# Patient Record
Sex: Female | Born: 1955 | Race: White | Hispanic: No | Marital: Single | Smoking: Never smoker
Health system: Southern US, Community
[De-identification: ages and names within clinical notes are randomized; demographics above are authoritative.]

## PROBLEM LIST (undated history)

## (undated) DIAGNOSIS — F32A Depression, unspecified: Secondary | ICD-10-CM

## (undated) DIAGNOSIS — T7840XA Allergy, unspecified, initial encounter: Secondary | ICD-10-CM

## (undated) DIAGNOSIS — F988 Other specified behavioral and emotional disorders with onset usually occurring in childhood and adolescence: Secondary | ICD-10-CM

## (undated) DIAGNOSIS — S065X9A Traumatic subdural hemorrhage with loss of consciousness of unspecified duration, initial encounter: Secondary | ICD-10-CM

## (undated) DIAGNOSIS — G4730 Sleep apnea, unspecified: Secondary | ICD-10-CM

## (undated) DIAGNOSIS — E039 Hypothyroidism, unspecified: Secondary | ICD-10-CM

## (undated) DIAGNOSIS — M810 Age-related osteoporosis without current pathological fracture: Secondary | ICD-10-CM

## (undated) DIAGNOSIS — E119 Type 2 diabetes mellitus without complications: Secondary | ICD-10-CM

## (undated) DIAGNOSIS — E785 Hyperlipidemia, unspecified: Secondary | ICD-10-CM

## (undated) DIAGNOSIS — R079 Chest pain, unspecified: Secondary | ICD-10-CM

## (undated) DIAGNOSIS — S065XAA Traumatic subdural hemorrhage with loss of consciousness status unknown, initial encounter: Secondary | ICD-10-CM

## (undated) DIAGNOSIS — D649 Anemia, unspecified: Secondary | ICD-10-CM

## (undated) DIAGNOSIS — K219 Gastro-esophageal reflux disease without esophagitis: Secondary | ICD-10-CM

## (undated) DIAGNOSIS — I10 Essential (primary) hypertension: Secondary | ICD-10-CM

## (undated) DIAGNOSIS — N183 Chronic kidney disease, stage 3 unspecified: Secondary | ICD-10-CM

## (undated) DIAGNOSIS — F419 Anxiety disorder, unspecified: Secondary | ICD-10-CM

## (undated) HISTORY — DX: Anemia, unspecified: D64.9

## (undated) HISTORY — DX: Traumatic subdural hemorrhage with loss of consciousness of unspecified duration, initial encounter: S06.5X9A

## (undated) HISTORY — DX: Age-related osteoporosis without current pathological fracture: M81.0

## (undated) HISTORY — DX: Chronic kidney disease, stage 3 (moderate): N18.3

## (undated) HISTORY — PX: COLONOSCOPY: SHX174

## (undated) HISTORY — PX: OTHER SURGICAL HISTORY: SHX169

## (undated) HISTORY — DX: Type 2 diabetes mellitus without complications: E11.9

## (undated) HISTORY — DX: Traumatic subdural hemorrhage with loss of consciousness status unknown, initial encounter: S06.5XAA

## (undated) HISTORY — DX: Other specified behavioral and emotional disorders with onset usually occurring in childhood and adolescence: F98.8

## (undated) HISTORY — DX: Chronic kidney disease, stage 3 unspecified: N18.30

## (undated) HISTORY — DX: Allergy, unspecified, initial encounter: T78.40XA

## (undated) HISTORY — DX: Hypothyroidism, unspecified: E03.9

## (undated) HISTORY — PX: BRAIN SURGERY: SHX531

## (undated) HISTORY — DX: Gastro-esophageal reflux disease without esophagitis: K21.9

## (undated) HISTORY — DX: Hyperlipidemia, unspecified: E78.5

## (undated) HISTORY — DX: Essential (primary) hypertension: I10

## (undated) HISTORY — PX: FRACTURE SURGERY: SHX138

## (undated) HISTORY — DX: Chest pain, unspecified: R07.9

## (undated) HISTORY — PX: SMALL INTESTINE SURGERY: SHX150

## (undated) HISTORY — DX: Sleep apnea, unspecified: G47.30

## (undated) HISTORY — DX: Anxiety disorder, unspecified: F41.9

## (undated) HISTORY — DX: Depression, unspecified: F32.A

## (undated) HISTORY — PX: TONSILLECTOMY: SUR1361

---

## 1993-10-07 DIAGNOSIS — G4730 Sleep apnea, unspecified: Secondary | ICD-10-CM

## 1993-10-07 HISTORY — DX: Sleep apnea, unspecified: G47.30

## 2003-10-08 HISTORY — PX: GASTRIC BYPASS: SHX52

## 2006-01-02 ENCOUNTER — Ambulatory Visit (HOSPITAL_BASED_OUTPATIENT_CLINIC_OR_DEPARTMENT_OTHER): Admission: RE | Admit: 2006-01-02 | Discharge: 2006-01-02 | Payer: Self-pay | Admitting: Orthopedic Surgery

## 2010-06-02 ENCOUNTER — Emergency Department (HOSPITAL_COMMUNITY): Admission: EM | Admit: 2010-06-02 | Discharge: 2010-06-02 | Payer: Self-pay | Admitting: Family Medicine

## 2010-12-21 LAB — GIARDIA/CRYPTOSPORIDIUM SCREEN(EIA)
Cryptosporidium Screen (EIA): NEGATIVE
Giardia Screen - EIA: NEGATIVE

## 2010-12-21 LAB — STOOL CULTURE

## 2011-02-22 NOTE — Op Note (Signed)
NAMESHACONDA, HAJDUK               ACCOUNT NO.:  [id number]   MEDICAL RECORD NO.:  [id number]          PATIENT TYPE:  AMB   LOCATION:  DSC                          FACILITY:  [facility]   PHYSICIAN:  Katy Fitch. Sypher, M.D. 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   OPERATION:  1.  Release of right transverse carpal ligament.  2.  Injection of left ulnar bursa.   OPERATING SURGEON:  Josephine Igo, MD   ASSISTANT:  Annye Rusk, PA-C.   ANESTHESIA:  General by LMA.   SUPERVISING ANESTHESIOLOGIST:  Janetta Hora. Gelene Mink, MD   INDICATIONS:  Tracy Price is a 55 year old right-hand-dominant woman with  a history of bilateral hand numbness and pain.  Clinical examination in our  office revealed evidence of bilateral carpal tunnel syndrome.  Electrodiagnostic studies completed by Dr. Johna Roles revealed moderately  severe right carpal tunnel syndrome and moderate left carpal tunnel  syndrome.   She has been night-splinting without relief.   She is brought to the operating room at this time, anticipating release of  her right transverse carpal ligament and injection of her left ulnar bursa.   Preoperatively, questions were invited and answered.   Due to a history of a recent blister on her right hand, she was provided  perioperative antibiotics in the form of Ancef 1 g IV 1 hour prior to  surgery.   PROCEDURE:  Jarissa Sheriff was brought to the operating room and placed in  supine position upon the operating table.   Following an Anesthesia consult by Dr. Gelene Mink, general anesthesia by LMA  was selected.   The right arm was prepped with Betadine soap and solution and sterilely  draped.  A pneumatic tourniquet  was applied to the proximal brachium.   Following exsanguination of the right arm with an Esmarch bandage, the  arterial tourniquet was inflated to 220 mmHg.  The procedure commenced with  a short incision in the line of the ring finger and the palm.  Subcutaneous  tissues were carefully divided, revealing the palmar fascia.  This was split  longitudinally to reveal the common extensor branch of the median nerve.   These were followed back to transverse carpal ligament, which was carefully  isolated from the median nerve.  The ligament was then released along its  ulnar border, extending into the distal forearm.   This widely opened the carpal canal.   No masses or other predicaments were noted.   Bleeding points along the margin of the released ligament were  electrocauterized with bipolar current followed by repair of the skin with  intradermal 3-0 Prolene suture.   A compressive dressing was applied with a volar plaster splint, maintaining  the wrist in 5 degrees of dorsiflexion.      Katy Fitch Sypher, M.D.  Electronically Signed     RVS/MEDQ  D:  01/02/2006  T:  01/04/2006  Job:  [id number]

## 2013-01-04 ENCOUNTER — Telehealth: Payer: Self-pay | Admitting: Family Medicine

## 2013-01-04 MED ORDER — LOSARTAN POTASSIUM-HCTZ 50-12.5 MG PO TABS: 1.0000 | ORAL_TABLET | Freq: Every day | ORAL | Status: DC

## 2013-01-04 NOTE — Telephone Encounter (Signed)
rx refilled.

## 2013-02-23 ENCOUNTER — Encounter: Payer: Self-pay | Admitting: Family Medicine

## 2013-02-23 ENCOUNTER — Ambulatory Visit (INDEPENDENT_AMBULATORY_CARE_PROVIDER_SITE_OTHER): Payer: BC Managed Care – PPO | Admitting: Family Medicine

## 2013-02-23 VITALS — BP 158/96 | HR 64 | Temp 97.8°F | Resp 18 | Ht 60.75 in | Wt 166.0 lb

## 2013-02-23 DIAGNOSIS — E785 Hyperlipidemia, unspecified: Secondary | ICD-10-CM | POA: Insufficient documentation

## 2013-02-23 DIAGNOSIS — E119 Type 2 diabetes mellitus without complications: Secondary | ICD-10-CM | POA: Insufficient documentation

## 2013-02-23 DIAGNOSIS — R079 Chest pain, unspecified: Secondary | ICD-10-CM

## 2013-02-23 DIAGNOSIS — I10 Essential (primary) hypertension: Secondary | ICD-10-CM | POA: Insufficient documentation

## 2013-02-23 LAB — CBC WITH DIFFERENTIAL/PLATELET
Basophils Absolute: 0.1 10*3/uL (ref 0.0–0.1)
Basophils Relative: 1 % (ref 0–1)
Eosinophils Absolute: 0.6 10*3/uL (ref 0.0–0.7)
HCT: 42.2 % (ref 36.0–46.0)
Hemoglobin: 14.2 g/dL (ref 12.0–15.0)
MCH: 30 pg (ref 26.0–34.0)
MCHC: 33.6 g/dL (ref 30.0–36.0)
Monocytes Absolute: 0.5 10*3/uL (ref 0.1–1.0)
Monocytes Relative: 8 % (ref 3–12)
RDW: 12.8 % (ref 11.5–15.5)

## 2013-02-23 LAB — HEPATIC FUNCTION PANEL
ALT: 13 U/L (ref 0–35)
AST: 15 U/L (ref 0–37)
Albumin: 4.5 g/dL (ref 3.5–5.2)
Alkaline Phosphatase: 72 U/L (ref 39–117)
Indirect Bilirubin: 0.5 mg/dL (ref 0.0–0.9)
Total Protein: 7.2 g/dL (ref 6.0–8.3)

## 2013-02-23 LAB — BASIC METABOLIC PANEL
CO2: 31 mEq/L (ref 19–32)
Calcium: 9.7 mg/dL (ref 8.4–10.5)
Chloride: 98 mEq/L (ref 96–112)
Creat: 0.79 mg/dL (ref 0.50–1.10)
Glucose, Bld: 100 mg/dL — ABNORMAL HIGH (ref 70–99)
Sodium: 138 mEq/L (ref 135–145)

## 2013-02-23 LAB — LIPID PANEL: LDL Cholesterol: 75 mg/dL (ref 0–99)

## 2013-02-23 MED ORDER — AMLODIPINE BESYLATE 10 MG PO TABS: 10.0000 mg | ORAL_TABLET | Freq: Every day | ORAL | Status: DC

## 2013-02-23 NOTE — Progress Notes (Signed)
Subjective:    Patient ID: Tracy Price, female    DOB: [birth year], 57 y.o.   MRN: [id number]  HPI  Patient is a 57 year old white female who presents with atypical chest pain for 6 weeks. It is substernal in location. It feels like a heaviness in the chest.  It is triggered by exertion.  She denies shortness of breath. However she reports a pain that radiates into her neck when she has the chest pressure. She is very nervous this morning. Previous her blood pressure had been well controlled 130/ 80 on Hyzaar 50/12.5.  However today her blood pressure is high 180-194/100-112.  She has a past medical history of hypertension, hyperlipidemia, and diabetes. Furthermore she has a very significant family history of cardiovascular disease with the mother whose had a bypass and stroke and a brother had a heart attack in his 63s.    She believes this is due to stress triggered by the death of her sister from sepsis. She is pain-free at rest Past Medical History  Diagnosis Date  . Diabetes mellitus without complication   . Hyperlipidemia   . Hypertension    Current outpatient prescriptions:aspirin 325 MG tablet, Take 325 mg by mouth daily., Disp: , Rfl: ;  Biotin 10 MG CAPS, Take by mouth daily., Disp: , Rfl: ;  cetirizine (ZYRTEC) 10 MG tablet, Take 10 mg by mouth daily., Disp: , Rfl: ;  ibuprofen (ADVIL,MOTRIN) 200 MG tablet, Take 400 mg by mouth at bedtime as needed for pain., Disp: , Rfl:  losartan-hydrochlorothiazide (HYZAAR) 50-12.5 MG per tablet, Take 1 tablet by mouth daily., Disp: 90 tablet, Rfl: 1;  magnesium oxide (MAG-OX) 400 MG tablet, Take 400 mg by mouth daily., Disp: , Rfl: ;  Melatonin 1 MG TABS, Take by mouth at bedtime as needed., Disp: , Rfl: ;  metFORMIN (GLUCOPHAGE) 500 MG tablet, Take 500 mg by mouth 2 (two) times daily with a meal., Disp: , Rfl:  pravastatin (PRAVACHOL) 40 MG tablet, Take 40 mg by mouth at bedtime., Disp: , Rfl: ;  vitamin B-12 (CYANOCOBALAMIN) 1000 MCG tablet, Take  1,000 mcg by mouth daily., Disp: , Rfl: ;  amLODipine (NORVASC) 10 MG tablet, Take 1 tablet (10 mg total) by mouth daily., Disp: 30 tablet, Rfl: 5  No Known Allergies History   Social History  . Marital Status: Single    Spouse Name: N/A    Number of Children: N/A  . Years of Education: N/A   Occupational History  . Not on file.   Social History Main Topics  . Smoking status: Never Smoker   . Smokeless tobacco: Never Used  . Alcohol Use: No  . Drug Use: No  . Sexually Active: Not on file   Other Topics Concern  . Not on file   Social History Narrative  . No narrative on file   Family History  Problem Relation Age of Onset  . Heart disease Mother   . Diabetes Sister   . Heart disease Brother       Review of Systems  All other systems reviewed and are negative.       Objective:   Physical Exam  Vitals reviewed. Constitutional: She appears well-developed and well-nourished.  Neck: No JVD present. No thyromegaly present.  Cardiovascular: Normal rate, regular rhythm, normal heart sounds and intact distal pulses.  Exam reveals no gallop.   No murmur heard. Pulmonary/Chest: Effort normal and breath sounds normal. No respiratory distress. She has no wheezes. She has no rales. She  exhibits no tenderness.  Abdominal: Soft. Bowel sounds are normal. She exhibits no distension. There is no tenderness. There is no rebound and no guarding.  Lymphadenopathy:    She has no cervical adenopathy.          Assessment & Plan:  1. Chest pain EKG shows normal sinus rhythm with a heart rate of 69 beats per minute. She has normal intervals but a slight left axis deviation. There is no evidence of ST depression or elevation. There is no evidence of ischemia or infarction.  However I am concerned her symptoms represent angina.  I have asked the patient to take an aspirin empirically 5 mg by mouth daily. I also think we need to get her blood pressure down immediately. Therefore I have  asked her to discontinue Hyzaar. Kindly to start her on Benicar/HCTZ 40/12.5 one by mouth daily she is to take the first dose now. I also have added Norvasc 10 mg by mouth daily. I want to arrange cardiology consultation as soon as possible.  We'll recheck the patient's blood pressure in 48 hours. I think some of this elevation is due to anxiety as it has previously been well controlled. I am also going to risk stratify the patient with a hemoglobin A1c, fasting lipid panel. - EKG 12-Lead - Basic Metabolic Panel - CBC with Differential - Hemoglobin A1c - Hepatic Function Panel - Lipid Panel  2. Type II or unspecified type diabetes mellitus without mention of complication, not stated as uncontrolled - Hemoglobin A1c

## 2013-02-24 ENCOUNTER — Telehealth: Payer: Self-pay | Admitting: Family Medicine

## 2013-02-24 MED ORDER — METFORMIN HCL 1000 MG PO TABS: 1000.0000 mg | ORAL_TABLET | Freq: Two times a day (BID) | ORAL | Status: DC

## 2013-02-24 NOTE — Telephone Encounter (Signed)
No quit losartan, but yes take the other meds together.

## 2013-02-24 NOTE — Telephone Encounter (Signed)
Message copied by Donne Anon on Wed Feb 24, 2013  4:38 PM ------      Message from: Lynnea Ferrier      Created: Wed Feb 24, 2013  7:29 AM       Hga1c is slightly high at 6.8.  Increase metformin to 1000 mg pobid.  Recheck BP Friday. ------

## 2013-02-24 NOTE — Telephone Encounter (Signed)
Left mess to return call.  Rx sent to pharmacy

## 2013-02-25 ENCOUNTER — Encounter: Payer: Self-pay | Admitting: Cardiovascular Disease

## 2013-02-25 ENCOUNTER — Ambulatory Visit (INDEPENDENT_AMBULATORY_CARE_PROVIDER_SITE_OTHER): Payer: BC Managed Care – PPO | Admitting: Cardiovascular Disease

## 2013-02-25 VITALS — BP 128/80 | HR 84 | Ht 60.5 in | Wt 164.0 lb

## 2013-02-25 DIAGNOSIS — E785 Hyperlipidemia, unspecified: Secondary | ICD-10-CM

## 2013-02-25 DIAGNOSIS — R079 Chest pain, unspecified: Secondary | ICD-10-CM

## 2013-02-25 DIAGNOSIS — I10 Essential (primary) hypertension: Secondary | ICD-10-CM

## 2013-02-25 DIAGNOSIS — Z8249 Family history of ischemic heart disease and other diseases of the circulatory system: Secondary | ICD-10-CM

## 2013-02-25 NOTE — Patient Instructions (Signed)
  Your physician wants you to follow-up with him AFTER THE STRESS TEST                                                    Your physician has recommended you make the following change in your medication: STOP THE LOSARTAN HCT, CONTINUE THE BENICAR HCT   Your physician has ordered the following tests: CARDIAC STRESS TEST (EXERCISE MYOVIEW) Your physician has requested that you have en exercise stress myoview. For further information please visit [email or web address] Please follow instruction sheet, as given.

## 2013-02-25 NOTE — Assessment & Plan Note (Signed)
Under good control on oral antihypertensive medications followed by Dr. Tanya Nones

## 2013-02-25 NOTE — Assessment & Plan Note (Signed)
Under good control on statin therapy followed by Dr. Tanya Nones

## 2013-02-25 NOTE — Assessment & Plan Note (Signed)
The patient has multiple cardiac risk factors including a strong family history of heart disease, hypertension, hyperlipidemia and newly diagnosed type 2 diabetes. Based on this I don't perform an exercise Myoview stress test to risk stratify her and rule out an ischemic etiology.

## 2013-02-25 NOTE — Progress Notes (Signed)
02/25/2013 Tracy Price   Feb 15, 1956  [id number]  Primary Physician Leo Grosser, MD Primary Cardiologist: Runell Gess MD Roseanne Reno  HPI:  Tracy Price is a 57 year old single Caucasian female with no children referred by Dr. Tanya Nones for cardiovascular evaluation because of new-onset chest pain. 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 Her cardiac risk profile is positive for type 2 diabetes, hypertension and hyperlipidemia. She does have a strong family history heart disease with a mother who is a patient of Dr. Selmer Dominion and currently followed by Dr. Royann Shivers  who has a history of CAD and has had stenting. Her sister has suffered a cardiac arrest and died at age 76 and her brother had a myocardial infarction at age 34. Over the last several weeks she's developed episodes of left precordial chest pain which is associated with diaphoresis. She saw her primary care physician who referred her here for further evaluation.   Current Outpatient Prescriptions  Medication Sig Dispense Refill  . amLODipine (NORVASC) 10 MG tablet Take 1 tablet (10 mg total) by mouth daily.  30 tablet  5  . aspirin 325 MG tablet Take 325 mg by mouth 2 (two) times daily.       . Biotin 10 MG CAPS Take by mouth daily.      . cetirizine (ZYRTEC) 10 MG tablet Take 10 mg by mouth daily.      Marland Kitchen ibuprofen (ADVIL,MOTRIN) 200 MG tablet Take 400 mg by mouth at bedtime as needed for pain.      . magnesium oxide (MAG-OX) 400 MG tablet Take 400 mg by mouth daily.      . Melatonin 1 MG TABS Take by mouth at bedtime as needed.      . metFORMIN (GLUCOPHAGE) 1000 MG tablet Take 1 tablet (1,000 mg total) by mouth 2 (two) times daily with a meal.  180 tablet  3  . olmesartan-hydrochlorothiazide (BENICAR HCT) 40-12.5 MG per tablet Take 1 tablet by mouth daily.      . pravastatin (PRAVACHOL) 40 MG tablet Take 40 mg by mouth at bedtime.      . vitamin B-12  (CYANOCOBALAMIN) 1000 MCG tablet Take 1,000 mcg by mouth daily.       No current facility-administered medications for this visit.    No Known Allergies  History   Social History  . Marital Status: Single    Spouse Name: N/A    Number of Children: N/A  . Years of Education: N/A   Occupational History  . Not on file.   Social History Main Topics  . Smoking status: Never Smoker   . Smokeless tobacco: Never Used  . Alcohol Use: No  . Drug Use: No  . Sexually Active: Not on file   Other Topics Concern  . Not on file   Social History Narrative  . No narrative on file     Review of Systems: General: negative for chills, fever, night sweats or weight changes.  Cardiovascular: negative for chest pain, dyspnea on exertion, edema, orthopnea, palpitations, paroxysmal nocturnal dyspnea or shortness of breath Dermatological: negative for rash Respiratory: negative for cough or wheezing Urologic: negative for hematuria Abdominal: negative for nausea, vomiting, diarrhea, bright red blood per rectum, melena, or hematemesis Neurologic: negative for visual changes, syncope, or dizziness All other systems reviewed and are otherwise negative except as noted above.    Blood pressure 128/80, pulse 84, height 5' 0.5" (1.537 m), weight 164  lb (74.39 kg).  General appearance: alert and no distress Neck: no adenopathy, no carotid bruit, no JVD, supple, symmetrical, trachea midline and thyroid not enlarged, symmetric, no tenderness/mass/nodules Lungs: clear to auscultation bilaterally Heart: regular rate and rhythm, S1, S2 normal, no murmur, click, rub or gallop Abdomen: soft, non-tender; bowel sounds normal; no masses,  no organomegaly Extremities: extremities normal, atraumatic, no cyanosis or edema Pulses: 2+ and symmetric  EKG not performed today since it was recently performed by Dr. Constance Haw  ASSESSMENT AND PLAN:   Hyperlipidemia Under good control on statin therapy followed by  Dr. Tanya Nones  Hypertension Under good control on oral antihypertensive medications followed by Dr. Tanya Nones  Chest pain The patient has multiple cardiac risk factors including a strong family history of heart disease, hypertension, hyperlipidemia and newly diagnosed type 2 diabetes. Based on this I don't perform an exercise Myoview stress test to risk stratify her and rule out an ischemic etiology.      Runell Gess MD FACP,FACC,FAHA, [facility] 02/25/2013 9:14 AM

## 2013-02-26 ENCOUNTER — Ambulatory Visit (INDEPENDENT_AMBULATORY_CARE_PROVIDER_SITE_OTHER): Payer: BC Managed Care – PPO | Admitting: *Deleted

## 2013-02-26 VITALS — BP 110/70

## 2013-02-26 DIAGNOSIS — I10 Essential (primary) hypertension: Secondary | ICD-10-CM

## 2013-02-26 MED ORDER — PRAVASTATIN SODIUM 40 MG PO TABS: 40.0000 mg | ORAL_TABLET | Freq: Every day | ORAL | Status: DC

## 2013-02-26 NOTE — Progress Notes (Signed)
Patient ID: Tracy Price, female   DOB: 1956/09/05, 57 y.o.   MRN: [id number] Pt came in for BP check and also to get her meds changed to 6 mos supply. Pt has appt schedules for stress test on Friday May 30 with Dr. Wynona Luna [facility].

## 2013-02-26 NOTE — Telephone Encounter (Signed)
Pt came in today states she has been leaving messages and no response, she came in to her BP checked and wanted refills for 6mos. I told her about stopping the losartan and continue other medications.

## 2013-03-05 ENCOUNTER — Ambulatory Visit (HOSPITAL_COMMUNITY)
Admission: RE | Admit: 2013-03-05 | Discharge: 2013-03-05 | Disposition: A | Discharge status: Home / Self Care | Payer: BC Managed Care – PPO | Source: Ambulatory Visit | Attending: Cardiovascular Disease | Admitting: Cardiovascular Disease

## 2013-03-05 DIAGNOSIS — E669 Obesity, unspecified: Secondary | ICD-10-CM | POA: Insufficient documentation

## 2013-03-05 DIAGNOSIS — I10 Essential (primary) hypertension: Secondary | ICD-10-CM | POA: Insufficient documentation

## 2013-03-05 DIAGNOSIS — R0609 Other forms of dyspnea: Secondary | ICD-10-CM | POA: Insufficient documentation

## 2013-03-05 DIAGNOSIS — R002 Palpitations: Secondary | ICD-10-CM | POA: Insufficient documentation

## 2013-03-05 DIAGNOSIS — E119 Type 2 diabetes mellitus without complications: Secondary | ICD-10-CM | POA: Insufficient documentation

## 2013-03-05 DIAGNOSIS — R0989 Other specified symptoms and signs involving the circulatory and respiratory systems: Secondary | ICD-10-CM | POA: Insufficient documentation

## 2013-03-05 DIAGNOSIS — R079 Chest pain, unspecified: Secondary | ICD-10-CM | POA: Insufficient documentation

## 2013-03-05 DIAGNOSIS — R61 Generalized hyperhidrosis: Secondary | ICD-10-CM | POA: Insufficient documentation

## 2013-03-05 MED ORDER — TECHNETIUM TC 99M SESTAMIBI GENERIC - CARDIOLITE
30.5000 | Freq: Once | INTRAVENOUS | Status: AC | PRN
  Administered 2013-03-05: 31 via INTRAVENOUS

## 2013-03-05 MED ORDER — TECHNETIUM TC 99M SESTAMIBI GENERIC - CARDIOLITE
10.2000 | Freq: Once | INTRAVENOUS | Status: AC | PRN
  Administered 2013-03-05: 10 via INTRAVENOUS

## 2013-03-05 NOTE — Procedures (Addendum)
[facility] [address] [place] [address] [phone]  Cardiology Nuclear Med Study  Tracy Price is a 57 y.o. female     MRN : [id number]     DOB: 1956/04/25  Procedure Date: 03/05/2013  Nuclear Med Background Indication for Stress Test:  Evaluation for Ischemia History:  NO PRIOR HISTORY Cardiac Risk Factors: Family History - CAD, Hypertension, Lipids, NIDDM and Obesity  Symptoms:  Chest Pain, Diaphoresis, DOE, Fatigue, Light-Headedness and Palpitations   Nuclear Pre-Procedure Caffeine/Decaff Intake:  10:00pm NPO After: 8:00am   IV Site: R Antecubital  IV 0.9% NS with Angio Cath:  22g  Chest Size (in):  N/A IV Started by: Emmit Pomfret, RN  Height: 5' (1.524 m)  Cup Size: B  BMI:  Body mass index is 32.03 kg/(m^2). Weight:  164 lb (74.39 kg)   Tech Comments:  N/A    Nuclear Med Study 1 or 2 day study: 1 day  Stress Test Type:  Stress  Order Authorizing Provider:  Nanetta Batty, MD   Resting Radionuclide: Technetium 64m Sestamibi  Resting Radionuclide Dose: 10.2 mCi   Stress Radionuclide:  Technetium 56m Sestamibi  Stress Radionuclide Dose: 30.5 mCi           Stress Protocol Rest HR:75 Stress HR: 155  Rest BP: 144/83 Stress BP: 164/83  Exercise Time (min): 6:00 METS: 7.00   Predicted Max HR: 164 bpm % Max HR: 94.51 bpm Rate Pressure Product: 62952  Dose of Adenosine (mg):  n/a Dose of Lexiscan: n/a  Dose of Atropine (mg): n/a Dose of Dobutamine: n/a mcg/kg/min (at max HR)  Stress Test Technologist: Ernestene Mention, CCT Nuclear Technologist: Gonzella Lex, CNMT   Rest Procedure:  Myocardial perfusion imaging was performed at rest 45 minutes following the intravenous administration of Technetium 45m Sestamibi. Stress Procedure:  The patient performed treadmill exercise using a Bruce  Protocol for 6 minutes. The patient stopped due to fatigue. Patient denied any chest pain.  There were no significant ST-T  wave changes.  Technetium 58m Sestamibi was injected at peak exercise and myocardial perfusion imaging was performed after a brief delay.  Transient Ischemic Dilatation (Normal <1.22):  0.92 Lung/Heart Ratio (Normal <0.45):  0.26 QGS EDV:  49 ml QGS ESV:  10 ml LV Ejection Fraction: 80%  Signed by  Gonzella Lex, CNMT  PHYSICIAN INTERPRETATION:  Rest ECG: NSR - Normal EKG  Stress ECG: No significant change from baseline ECG, No significant ST segment change suggestive of ischemia. and Insignificant upsloping ST segment depression.  QPS Raw Data Images:  There is interference from nuclear activity from structures below the diaphragm. This does not affect the ability to read the study. Stress Images:  Normal homogeneous uptake in all areas of the myocardium. Rest Images:  Normal homogeneous uptake in all areas of the myocardium. Subtraction (SDS):  There is no evidence of scar or ischemia.  Impression Exercise Capacity:  Good exercise capacity. BP Response:  Hypertensive blood pressure response. Clinical Symptoms:  No chest pain. Fatigue ECG Impression:  No significant ST segment change suggestive of ischemia. Insignificant upsloping ST segment depression. Comparison with Prior Nuclear Study: No images to compare  Overall Impression:  Normal stress nuclear study. and Low risk stress nuclear study no evidence of ischemia or infarction. Hypertensive response to excercise.  LV Wall Motion:  NL LV Function; NL Wall Motion EF of 80% is likely an overestimation due ot low LV volumes.   Marykay Lex, MD  03/05/2013 1:36 PM

## 2013-03-09 ENCOUNTER — Telehealth: Payer: Self-pay | Admitting: Cardiovascular Disease

## 2013-03-10 NOTE — Telephone Encounter (Signed)
Lm for patient.  

## 2013-03-11 ENCOUNTER — Telehealth: Payer: Self-pay | Admitting: Cardiovascular Disease

## 2013-03-11 NOTE — Telephone Encounter (Signed)
lmom 

## 2013-03-11 NOTE — Telephone Encounter (Signed)
Tracy Price is returning your call, if you can please call her at her at this number before 10am..[phone]

## 2013-03-14 NOTE — Telephone Encounter (Signed)
Pt notified of results of nuclear test...normal

## 2013-03-19 ENCOUNTER — Telehealth: Payer: Self-pay | Admitting: Family Medicine

## 2013-03-19 MED ORDER — OLMESARTAN MEDOXOMIL-HCTZ 40-12.5 MG PO TABS: 1.0000 | ORAL_TABLET | Freq: Every day | ORAL | Status: DC

## 2013-03-19 NOTE — Telephone Encounter (Signed)
Rx Refilled  

## 2013-03-29 ENCOUNTER — Telehealth: Payer: Self-pay | Admitting: Family Medicine

## 2013-03-29 MED ORDER — LOSARTAN POTASSIUM-HCTZ 100-25 MG PO TABS: 1.0000 | ORAL_TABLET | Freq: Every day | ORAL | Status: DC

## 2013-03-29 NOTE — Telephone Encounter (Signed)
Switch to hyzaar 100/25 poqday

## 2013-03-29 NOTE — Telephone Encounter (Signed)
Done and ..Patient aware  

## 2013-04-23 ENCOUNTER — Ambulatory Visit (INDEPENDENT_AMBULATORY_CARE_PROVIDER_SITE_OTHER): Payer: BC Managed Care – PPO | Admitting: Family Medicine

## 2013-04-23 ENCOUNTER — Encounter: Payer: Self-pay | Admitting: Family Medicine

## 2013-04-23 VITALS — BP 126/70 | HR 80 | Temp 98.1°F | Resp 16 | Wt 158.0 lb

## 2013-04-23 DIAGNOSIS — M545 Low back pain: Secondary | ICD-10-CM

## 2013-04-23 DIAGNOSIS — M6530 Trigger finger, unspecified finger: Secondary | ICD-10-CM

## 2013-04-23 MED ORDER — LOSARTAN POTASSIUM-HCTZ 100-25 MG PO TABS: 1.0000 | ORAL_TABLET | Freq: Every day | ORAL | Status: DC

## 2013-04-23 MED ORDER — TRAMADOL HCL 50 MG PO TABS: 50.0000 mg | ORAL_TABLET | Freq: Three times a day (TID) | ORAL | Status: DC | PRN

## 2013-04-23 NOTE — Progress Notes (Signed)
Subjective:    Patient ID: Tracy Price, female    DOB: [birth year], 56 y.o.   MRN: [id number]  HPI Patient presents today with trigger fingers in her left 4th MCP joint and third MCP.  She is requesting a present injection for trigger fingers. She had one last one 6 months ago and it really helped her pain I did refer her to a hand surgeon however he cannot undergo surgery right now and she used the saw to caregiver for her disabled mother.    She also has low back pain.  She denies saddle anesthesia, sciatica, numbness in the legs, or weakness in the legs.  The pain is muscular in nature.  Ibuprofen does not help the pain. Past Medical History  Diagnosis Date  . Diabetes mellitus without complication     type 2  . Hyperlipidemia   . Hypertension   . Sleep apnea 1995    had surgery to correct  . Chest pain    Current Outpatient Prescriptions on File Prior to Visit  Medication Sig Dispense Refill  . amLODipine (NORVASC) 10 MG tablet Take 1 tablet (10 mg total) by mouth daily.  30 tablet  5  . aspirin 325 MG tablet Take 325 mg by mouth 2 (two) times daily.       . Biotin 10 MG CAPS Take by mouth daily.      . cetirizine (ZYRTEC) 10 MG tablet Take 10 mg by mouth daily.      . magnesium oxide (MAG-OX) 400 MG tablet Take 400 mg by mouth daily.      . Melatonin 1 MG TABS Take by mouth at bedtime as needed.      . metFORMIN (GLUCOPHAGE) 1000 MG tablet Take 1 tablet (1,000 mg total) by mouth 2 (two) times daily with a meal.  180 tablet  3  . pravastatin (PRAVACHOL) 40 MG tablet Take 1 tablet (40 mg total) by mouth at bedtime.  360 tablet  0  . vitamin B-12 (CYANOCOBALAMIN) 1000 MCG tablet Take 1,000 mcg by mouth daily.      Marland Kitchen ibuprofen (ADVIL,MOTRIN) 200 MG tablet Take 400 mg by mouth at bedtime as needed for pain.       No current facility-administered medications on file prior to visit.   No Known Allergies History   Social History  . Marital Status: Single    Spouse Name: N/A   Number of Children: N/A  . Years of Education: N/A   Occupational History  . Not on file.   Social History Main Topics  . Smoking status: Never Smoker   . Smokeless tobacco: Never Used  . Alcohol Use: No  . Drug Use: No  . Sexually Active: Not on file   Other Topics Concern  . Not on file   Social History Narrative  . No narrative on file      Review of Systems  All other systems reviewed and are negative.       Objective:   Physical Exam  Vitals reviewed. Constitutional: She appears well-developed and well-nourished.  Cardiovascular: Normal rate and regular rhythm.   Pulmonary/Chest: Effort normal and breath sounds normal.  Musculoskeletal:       Lumbar back: She exhibits decreased range of motion, tenderness and pain. She exhibits no bony tenderness and no spasm.   trigger finger at the third and fourth MCP joints on the left hand.        Assessment & Plan:  1. Low back pain Did give  the patient prescription for Ultram 50 mg one by mouth every 8 hours when necessary back pain. I warned her against the possibility of dependence on this medication. I recommended her use it sparingly in addition to Motrin.  2. Trigger finger, acquired Using sterile technique, I injected both third and fourth digits at the volar aspect of the MCP joints with a combination of 1/2 cc of lidocaine and one half a cc of 40 mg per mL kenalog.  I recommended she follow up with a hand surgeon if persistant.

## 2013-05-03 ENCOUNTER — Telehealth: Payer: Self-pay | Admitting: Family Medicine

## 2013-05-03 NOTE — Telephone Encounter (Signed)
It could be the hyzaar.  Stop it, eat bananas and start losartan 100 poqday.  Recheck BP in 1 week.

## 2013-05-05 MED ORDER — LOSARTAN POTASSIUM 100 MG PO TABS: 100.0000 mg | ORAL_TABLET | Freq: Every day | ORAL | Status: DC

## 2013-05-05 NOTE — Telephone Encounter (Signed)
LMTRC

## 2013-05-05 NOTE — Telephone Encounter (Signed)
Patient aware and med sent to pharm 

## 2013-05-18 ENCOUNTER — Other Ambulatory Visit: Payer: Self-pay | Admitting: Family Medicine

## 2013-05-18 NOTE — Telephone Encounter (Signed)
Medication refilled per protocol. 

## 2013-06-17 ENCOUNTER — Ambulatory Visit (INDEPENDENT_AMBULATORY_CARE_PROVIDER_SITE_OTHER): Payer: BC Managed Care – PPO | Admitting: *Deleted

## 2013-06-17 DIAGNOSIS — Z23 Encounter for immunization: Secondary | ICD-10-CM

## 2013-12-24 ENCOUNTER — Telehealth: Payer: Self-pay | Admitting: Family Medicine

## 2013-12-24 MED ORDER — FIRST-BXN MOUTHWASH MT SUSP: OROMUCOSAL | Status: DC

## 2013-12-24 NOTE — Telephone Encounter (Signed)
Per Dr. Dennard Schaumann ok to send in mouthwash.  Mouthwash sent to pharm. And pt aware.

## 2013-12-24 NOTE — Telephone Encounter (Signed)
Message copied by Alyson Locket on Fri Dec 24, 2013 12:10 PM ------      Message from: Lenore Manner      Created: Fri Dec 24, 2013 11:11 AM      Regarding: Illness      Contact: [phone]       Pt is needing speak to you about her being sick for about 2 weeks, her fever is gone, and her mouth is like she ate glass, and she is wanting something called in? Pharmacy-[facility]      She is not able to come in because her mom is in hospital, she is not allergic to anything ------

## 2014-01-27 NOTE — Telephone Encounter (Signed)
Closed encounter °

## 2014-03-29 ENCOUNTER — Encounter: Payer: Self-pay | Admitting: Family Medicine

## 2014-03-29 ENCOUNTER — Telehealth: Payer: Self-pay | Admitting: Family Medicine

## 2014-03-29 MED ORDER — METFORMIN HCL 1000 MG PO TABS: 1000.0000 mg | ORAL_TABLET | Freq: Two times a day (BID) | ORAL | Status: DC

## 2014-03-29 MED ORDER — AMLODIPINE BESYLATE 10 MG PO TABS: 10.0000 mg | ORAL_TABLET | Freq: Every day | ORAL | Status: DC

## 2014-03-29 NOTE — Telephone Encounter (Signed)
Medication refill for one time only.  Patient needs to be seen.  Letter sent for patient to call and schedule 

## 2014-04-19 ENCOUNTER — Other Ambulatory Visit: Payer: Self-pay | Admitting: Family Medicine

## 2014-04-19 NOTE — Telephone Encounter (Signed)
Medication filled x1 with no refills.   Requires office visit before any further refills can be given.   Letter sent.  

## 2014-04-27 ENCOUNTER — Other Ambulatory Visit: Payer: BC Managed Care – PPO

## 2014-04-27 DIAGNOSIS — Z8249 Family history of ischemic heart disease and other diseases of the circulatory system: Secondary | ICD-10-CM

## 2014-04-27 DIAGNOSIS — I10 Essential (primary) hypertension: Secondary | ICD-10-CM

## 2014-04-27 DIAGNOSIS — E538 Deficiency of other specified B group vitamins: Secondary | ICD-10-CM

## 2014-04-27 DIAGNOSIS — E785 Hyperlipidemia, unspecified: Secondary | ICD-10-CM

## 2014-04-27 DIAGNOSIS — E119 Type 2 diabetes mellitus without complications: Secondary | ICD-10-CM

## 2014-04-27 DIAGNOSIS — Z79899 Other long term (current) drug therapy: Secondary | ICD-10-CM

## 2014-04-27 LAB — CBC WITH DIFFERENTIAL/PLATELET
BASOS ABS: 0.1 10*3/uL (ref 0.0–0.1)
Basophils Relative: 1 % (ref 0–1)
Eosinophils Absolute: 0.6 10*3/uL (ref 0.0–0.7)
Eosinophils Relative: 7 % — ABNORMAL HIGH (ref 0–5)
HEMATOCRIT: 35.7 % — AB (ref 36.0–46.0)
HEMOGLOBIN: 12 g/dL (ref 12.0–15.0)
LYMPHS PCT: 18 % (ref 12–46)
Lymphs Abs: 1.4 10*3/uL (ref 0.7–4.0)
MCH: 29.5 pg (ref 26.0–34.0)
MCHC: 33.6 g/dL (ref 30.0–36.0)
MCV: 87.7 fL (ref 78.0–100.0)
MONO ABS: 0.6 10*3/uL (ref 0.1–1.0)
MONOS PCT: 8 % (ref 3–12)
NEUTROS ABS: 5.3 10*3/uL (ref 1.7–7.7)
NEUTROS PCT: 66 % (ref 43–77)
Platelets: 260 10*3/uL (ref 150–400)
RBC: 4.07 MIL/uL (ref 3.87–5.11)
RDW: 13 % (ref 11.5–15.5)
WBC: 8 10*3/uL (ref 4.0–10.5)

## 2014-04-27 LAB — HEMOGLOBIN A1C
HEMOGLOBIN A1C: 5.9 % — AB (ref <5.7)
MEAN PLASMA GLUCOSE: 123 mg/dL — AB (ref <117)

## 2014-04-27 LAB — COMPLETE METABOLIC PANEL WITH GFR
ALT: 10 U/L (ref 0–35)
AST: 14 U/L (ref 0–37)
Albumin: 4.3 g/dL (ref 3.5–5.2)
Alkaline Phosphatase: 69 U/L (ref 39–117)
BILIRUBIN TOTAL: 0.5 mg/dL (ref 0.2–1.2)
BUN: 49 mg/dL — ABNORMAL HIGH (ref 6–23)
CHLORIDE: 96 meq/L (ref 96–112)
CO2: 25 meq/L (ref 19–32)
CREATININE: 2.06 mg/dL — AB (ref 0.50–1.10)
Calcium: 9.3 mg/dL (ref 8.4–10.5)
GFR, EST AFRICAN AMERICAN: 30 mL/min — AB
GFR, Est Non African American: 26 mL/min — ABNORMAL LOW
Glucose, Bld: 74 mg/dL (ref 70–99)
Potassium: 4 mEq/L (ref 3.5–5.3)
SODIUM: 134 meq/L — AB (ref 135–145)
TOTAL PROTEIN: 7.2 g/dL (ref 6.0–8.3)

## 2014-04-27 LAB — LIPID PANEL
Cholesterol: 151 mg/dL (ref 0–200)
HDL: 52 mg/dL (ref >39)
LDL CALC: 80 mg/dL (ref 0–99)
Total CHOL/HDL Ratio: 2.9 Ratio
Triglycerides: 96 mg/dL (ref <150)
VLDL: 19 mg/dL (ref 0–40)

## 2014-04-27 LAB — TSH: TSH: 5.974 u[IU]/mL — AB (ref 0.350–4.500)

## 2014-04-27 LAB — VITAMIN B12: Vitamin B-12: >2000 pg/mL — ABNORMAL HIGH (ref 211–911)

## 2014-04-28 ENCOUNTER — Ambulatory Visit (INDEPENDENT_AMBULATORY_CARE_PROVIDER_SITE_OTHER): Payer: BC Managed Care – PPO | Admitting: Family Medicine

## 2014-04-28 ENCOUNTER — Encounter: Payer: Self-pay | Admitting: Family Medicine

## 2014-04-28 VITALS — BP 126/68 | HR 72 | Temp 98.7°F | Resp 18 | Ht 61.5 in | Wt 152.0 lb

## 2014-04-28 DIAGNOSIS — F32A Depression, unspecified: Secondary | ICD-10-CM

## 2014-04-28 DIAGNOSIS — N289 Disorder of kidney and ureter, unspecified: Secondary | ICD-10-CM

## 2014-04-28 DIAGNOSIS — M545 Low back pain, unspecified: Secondary | ICD-10-CM

## 2014-04-28 DIAGNOSIS — F329 Major depressive disorder, single episode, unspecified: Secondary | ICD-10-CM

## 2014-04-28 DIAGNOSIS — E119 Type 2 diabetes mellitus without complications: Secondary | ICD-10-CM

## 2014-04-28 DIAGNOSIS — F3289 Other specified depressive episodes: Secondary | ICD-10-CM

## 2014-04-28 DIAGNOSIS — I10 Essential (primary) hypertension: Secondary | ICD-10-CM

## 2014-04-28 MED ORDER — TRAMADOL HCL 50 MG PO TABS: 50.0000 mg | ORAL_TABLET | Freq: Four times a day (QID) | ORAL | Status: DC | PRN

## 2014-04-28 MED ORDER — SERTRALINE HCL 50 MG PO TABS: 100.0000 mg | ORAL_TABLET | Freq: Every day | ORAL | Status: DC

## 2014-04-29 ENCOUNTER — Encounter: Payer: Self-pay | Admitting: Family Medicine
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
## 2014-05-02 ENCOUNTER — Other Ambulatory Visit: Payer: Self-pay | Admitting: Family Medicine

## 2014-05-20 ENCOUNTER — Other Ambulatory Visit: Payer: BC Managed Care – PPO

## 2014-05-20 DIAGNOSIS — R799 Abnormal finding of blood chemistry, unspecified: Secondary | ICD-10-CM

## 2014-05-20 LAB — BASIC METABOLIC PANEL
BUN: 30 mg/dL — ABNORMAL HIGH (ref 6–23)
CHLORIDE: 98 meq/L (ref 96–112)
CO2: 29 meq/L (ref 19–32)
Calcium: 9.7 mg/dL (ref 8.4–10.5)
Creat: 1.22 mg/dL — ABNORMAL HIGH (ref 0.50–1.10)
GLUCOSE: 135 mg/dL — AB (ref 70–99)
Potassium: 3.4 mEq/L — ABNORMAL LOW (ref 3.5–5.3)
SODIUM: 137 meq/L (ref 135–145)

## 2014-06-08 ENCOUNTER — Other Ambulatory Visit: Payer: Self-pay | Admitting: Family Medicine

## 2014-06-09 NOTE — Telephone Encounter (Signed)
Ok to refill??  Last office visit/ refill 04/28/2014.

## 2014-06-09 NOTE — Telephone Encounter (Signed)
Medication called to pharmacy. 

## 2014-06-09 NOTE — Telephone Encounter (Signed)
ok 

## 2014-06-14 ENCOUNTER — Other Ambulatory Visit: Payer: Self-pay | Admitting: Family Medicine

## 2014-06-21 ENCOUNTER — Telehealth: Payer: Self-pay | Admitting: *Deleted

## 2014-06-21 ENCOUNTER — Ambulatory Visit (INDEPENDENT_AMBULATORY_CARE_PROVIDER_SITE_OTHER): Payer: BC Managed Care – PPO | Admitting: *Deleted

## 2014-06-21 DIAGNOSIS — Z23 Encounter for immunization: Secondary | ICD-10-CM

## 2014-06-21 MED ORDER — SERTRALINE HCL 100 MG PO TABS: 100.0000 mg | ORAL_TABLET | Freq: Every day | ORAL | Status: DC

## 2014-06-21 NOTE — Telephone Encounter (Signed)
Pt would like to have her Sertraline 50mg  to 75mg  or 100mg  twice daily, needs all RX's refilled for 90 day supply.   [facility] [address]

## 2014-06-21 NOTE — Telephone Encounter (Signed)
Pt refilled per provider approval

## 2014-06-21 NOTE — Telephone Encounter (Signed)
Patient should be on Zoloft 100 mg by mouth daily.  I am finally feeling that for 90 day supply. However if he feels it is not working like to see the patient back to discuss further options.

## 2014-07-24 ENCOUNTER — Other Ambulatory Visit: Payer: Self-pay | Admitting: Family Medicine

## 2014-07-25 NOTE — Telephone Encounter (Signed)
Medication called to pharmacy. 

## 2014-07-25 NOTE — Telephone Encounter (Signed)
ok 

## 2014-07-25 NOTE — Telephone Encounter (Signed)
Ok to refill??  Last office visit 04/28/2014.  Last refill 06/09/2014.

## 2014-08-30 ENCOUNTER — Other Ambulatory Visit: Payer: Self-pay | Admitting: Family Medicine

## 2014-08-30 NOTE — Telephone Encounter (Signed)
Ok to refill??  Last office visit 04/28/2014.  Last refill 07/25/2014.

## 2014-08-30 NOTE — Telephone Encounter (Signed)
ok 

## 2014-10-09 ENCOUNTER — Other Ambulatory Visit: Payer: Self-pay | Admitting: Family Medicine

## 2014-10-10 NOTE — Telephone Encounter (Signed)
Medication called to pharmacy. 

## 2014-10-10 NOTE — Telephone Encounter (Signed)
Ok to refill??  Last office visit 04/28/2014.  Last refill 08/30/2014.

## 2014-10-10 NOTE — Telephone Encounter (Signed)
ok 

## 2014-10-22 ENCOUNTER — Other Ambulatory Visit: Payer: Self-pay | Admitting: Family Medicine

## 2014-11-21 ENCOUNTER — Other Ambulatory Visit: Payer: Self-pay | Admitting: Family Medicine

## 2014-11-22 NOTE — Telephone Encounter (Signed)
Ok to refill??  Last office visit 04/29/2015.  Last refill 10/10/2014.

## 2014-11-22 NOTE — Telephone Encounter (Signed)
ok 

## 2014-12-12 ENCOUNTER — Other Ambulatory Visit: Payer: Self-pay | Admitting: Family Medicine

## 2014-12-12 ENCOUNTER — Encounter: Payer: Self-pay | Admitting: Family Medicine

## 2014-12-12 NOTE — Telephone Encounter (Signed)
Medication refill for one time only.  Patient needs to be seen.  Letter sent for patient to call and schedule 

## 2014-12-17 ENCOUNTER — Other Ambulatory Visit: Payer: Self-pay | Admitting: Family Medicine

## 2015-01-27 ENCOUNTER — Encounter: Payer: Self-pay | Admitting: Family Medicine

## 2015-01-27 ENCOUNTER — Other Ambulatory Visit: Payer: Self-pay | Admitting: Family Medicine

## 2015-01-27 NOTE — Telephone Encounter (Signed)
One refill called in.  Tried to call pt, sent letter NTBS

## 2015-01-27 NOTE — Telephone Encounter (Signed)
LRF 11/22/14  #60 + 1.  LOV 04/28/14  OK refill?

## 2015-01-27 NOTE — Telephone Encounter (Signed)
Ok but ntbs

## 2015-03-08 ENCOUNTER — Other Ambulatory Visit: Payer: Self-pay | Admitting: Family Medicine

## 2015-03-14 ENCOUNTER — Ambulatory Visit (INDEPENDENT_AMBULATORY_CARE_PROVIDER_SITE_OTHER): Payer: BC Managed Care – PPO | Admitting: Family Medicine

## 2015-03-14 ENCOUNTER — Encounter: Payer: Self-pay | Admitting: Family Medicine

## 2015-03-14 VITALS — BP 110/78 | HR 76 | Temp 98.3°F | Resp 18 | Ht 61.0 in | Wt 165.0 lb

## 2015-03-14 DIAGNOSIS — Z23 Encounter for immunization: Secondary | ICD-10-CM

## 2015-03-14 DIAGNOSIS — E119 Type 2 diabetes mellitus without complications: Secondary | ICD-10-CM

## 2015-03-14 DIAGNOSIS — I10 Essential (primary) hypertension: Secondary | ICD-10-CM

## 2015-03-14 DIAGNOSIS — E785 Hyperlipidemia, unspecified: Secondary | ICD-10-CM

## 2015-03-14 LAB — CBC WITH DIFFERENTIAL/PLATELET
BASOS PCT: 1 % (ref 0–1)
Basophils Absolute: 0.1 10*3/uL (ref 0.0–0.1)
Eosinophils Absolute: 0.5 10*3/uL (ref 0.0–0.7)
Eosinophils Relative: 7 % — ABNORMAL HIGH (ref 0–5)
HCT: 41.4 % (ref 36.0–46.0)
Hemoglobin: 14.1 g/dL (ref 12.0–15.0)
LYMPHS ABS: 1.7 10*3/uL (ref 0.7–4.0)
LYMPHS PCT: 22 % (ref 12–46)
MCH: 29.6 pg (ref 26.0–34.0)
MCHC: 34.1 g/dL (ref 30.0–36.0)
MCV: 86.8 fL (ref 78.0–100.0)
MONO ABS: 0.5 10*3/uL (ref 0.1–1.0)
MPV: 9.8 fL (ref 8.6–12.4)
Monocytes Relative: 7 % (ref 3–12)
NEUTROS PCT: 63 % (ref 43–77)
Neutro Abs: 4.9 10*3/uL (ref 1.7–7.7)
Platelets: 281 10*3/uL (ref 150–400)
RBC: 4.77 MIL/uL (ref 3.87–5.11)
RDW: 13.4 % (ref 11.5–15.5)
WBC: 7.7 10*3/uL (ref 4.0–10.5)

## 2015-03-14 LAB — LIPID PANEL
CHOL/HDL RATIO: 4.4 ratio
CHOLESTEROL: 220 mg/dL — AB (ref 0–200)
HDL: 50 mg/dL (ref >=46)
LDL CALC: 136 mg/dL — AB (ref 0–99)
TRIGLYCERIDES: 171 mg/dL — AB (ref <150)
VLDL: 34 mg/dL (ref 0–40)

## 2015-03-14 LAB — COMPLETE METABOLIC PANEL WITH GFR
ALT: 12 U/L (ref 0–35)
AST: 15 U/L (ref 0–37)
Albumin: 4.1 g/dL (ref 3.5–5.2)
Alkaline Phosphatase: 123 U/L — ABNORMAL HIGH (ref 39–117)
BUN: 18 mg/dL (ref 6–23)
CO2: 32 mEq/L (ref 19–32)
Calcium: 9.5 mg/dL (ref 8.4–10.5)
Chloride: 93 mEq/L — ABNORMAL LOW (ref 96–112)
Creat: 1.1 mg/dL (ref 0.50–1.10)
GFR, Est African American: 64 mL/min
GFR, Est Non African American: 55 mL/min — ABNORMAL LOW
Glucose, Bld: 260 mg/dL — ABNORMAL HIGH (ref 70–99)
POTASSIUM: 3.2 meq/L — AB (ref 3.5–5.3)
Sodium: 135 mEq/L (ref 135–145)
TOTAL PROTEIN: 7.5 g/dL (ref 6.0–8.3)
Total Bilirubin: 0.6 mg/dL (ref 0.2–1.2)

## 2015-03-14 LAB — HEMOGLOBIN A1C
Hgb A1c MFr Bld: 13 % — ABNORMAL HIGH (ref <5.7)
MEAN PLASMA GLUCOSE: 326 mg/dL — AB (ref <117)

## 2015-03-14 MED ORDER — TRAMADOL HCL 50 MG PO TABS: 50.0000 mg | ORAL_TABLET | Freq: Four times a day (QID) | ORAL | Status: DC | PRN

## 2015-03-14 NOTE — Addendum Note (Signed)
Addended by: Shary Decamp B on: 03/14/2015 10:30 AM   Modules accepted: Orders

## 2015-03-14 NOTE — Progress Notes (Signed)
Subjective:    Patient ID: Tracy Price, female    DOB: 09-08-1956, 59 y.o.   MRN: [id number]  HPI  patient has not been seen in almost a year. She is overdue for fasting lab work. She has a history of diabetes mellitus which is currently diet controlled. She denies any polyuria, polydipsia, blurred vision. She denies any shortness of breath, chest pain or dyspnea on exertion. She is also due for fasting lipid panel. She denies any myalgias or right upper quadrant pain.   She is overdue for a colonoscopy as well as a mammogram. She is also due for a Pap smear. Past Medical History  Diagnosis Date  . Diabetes mellitus without complication     type 2  . Hyperlipidemia   . Hypertension   . Sleep apnea 1995    had surgery to correct  . Chest pain    Past Surgical History  Procedure Laterality Date  . Gastric bypass  2005   Current Outpatient Prescriptions on File Prior to Visit  Medication Sig Dispense Refill  . amLODipine (NORVASC) 10 MG tablet TAKE 1 TABLET BY MOUTH EVERY DAY 30 tablet 11  . ibuprofen (ADVIL,MOTRIN) 200 MG tablet Take 400 mg by mouth at bedtime as needed for pain.    Marland Kitchen losartan-hydrochlorothiazide (HYZAAR) 100-25 MG per tablet TAKE 1 TABLET BY MOUTH EVERY DAY 30 tablet 5  . Melatonin 1 MG TABS Take by mouth at bedtime as needed.    . pravastatin (PRAVACHOL) 40 MG tablet TAKE 1 TABLET BY MOUTH ONCE A DAY 90 tablet 1  . sertraline (ZOLOFT) 100 MG tablet TAKE 1 TABLET BY MOUTH EVERY DAY 90 tablet 0  . traMADol (ULTRAM) 50 MG tablet TAKE 1 TABLET BY MOUTH EVERY 6 HOURS AS NEEDED FOR PAIN 60 tablet 0   No current facility-administered medications on file prior to visit.   No Known Allergies History   Social History  . Marital Status: Single    Spouse Name: N/A  . Number of Children: N/A  . Years of Education: N/A   Occupational History  . Not on file.   Social History Main Topics  . Smoking status: Never Smoker   . Smokeless tobacco: Never Used  . Alcohol  Use: No  . Drug Use: No  . Sexual Activity: Not on file   Other Topics Concern  . Not on file   Social History Narrative  . No narrative on file      Review of Systems  All other systems reviewed and are negative.      Objective:   Physical Exam  Constitutional: She appears well-developed and well-nourished.  Neck: No JVD present. No thyromegaly present.  Cardiovascular: Normal rate, regular rhythm, normal heart sounds and intact distal pulses.   No murmur heard. Pulmonary/Chest: Effort normal and breath sounds normal. No respiratory distress. She has no wheezes. She has no rales.  Abdominal: Soft. Bowel sounds are normal. She exhibits no distension. There is no tenderness. There is no rebound and no guarding.  Musculoskeletal: She exhibits no edema.  Lymphadenopathy:    She has no cervical adenopathy.  Vitals reviewed.         Assessment & Plan:  Essential hypertension  Diabetes mellitus type II, controlled - Plan: COMPLETE METABOLIC PANEL WITH GFR, CBC with Differential/Platelet, Lipid panel, Hemoglobin A1c  HLD (hyperlipidemia)   patient's blood pressure is excellent. I will make no changes in her blood pressure medication. I will check a fasting lipid panel. Goal  LDL cholesterol is less than 100. I will also check a hemoglobin A1c. Goal hemoglobin A1c is less than 6.5. I recommended a mammogram, Pap smear, and a colonoscopy.  Also recommended pneumovax 23.  Start asa 81 mg poqday and get annual eye exam.   Patient would also like to start a stimulant medication for ADD. She states that she has a history of ADD. I am very concerned by the stimulant medication given her cardiovascular risk factors. I ecommended that she discuss this with the psychiatrist. She would like to defer having a colonoscopy, mammogram, Pap smear at the present time

## 2015-03-15 ENCOUNTER — Other Ambulatory Visit: Payer: Self-pay | Admitting: Family Medicine

## 2015-03-15 LAB — MICROALBUMIN, URINE: Microalb, Ur: 0.7 mg/dL (ref <2.0)

## 2015-03-15 NOTE — Telephone Encounter (Signed)
Refill appropriate and filled per protocol. 

## 2015-03-20 ENCOUNTER — Ambulatory Visit (INDEPENDENT_AMBULATORY_CARE_PROVIDER_SITE_OTHER): Payer: BC Managed Care – PPO | Admitting: Family Medicine

## 2015-03-20 ENCOUNTER — Encounter: Payer: Self-pay | Admitting: Family Medicine

## 2015-03-20 VITALS — BP 122/80 | HR 100 | Temp 98.1°F | Resp 18 | Ht 61.0 in | Wt 165.0 lb

## 2015-03-20 DIAGNOSIS — E1165 Type 2 diabetes mellitus with hyperglycemia: Secondary | ICD-10-CM

## 2015-03-20 DIAGNOSIS — IMO0002 Reserved for concepts with insufficient information to code with codable children: Secondary | ICD-10-CM

## 2015-03-20 MED ORDER — EMPAGLIFLOZIN-LINAGLIPTIN 25-5 MG PO TABS: 1.0000 | ORAL_TABLET | Freq: Every day | ORAL | Status: DC

## 2015-03-20 MED ORDER — BLOOD GLUCOSE METER KIT: PACK | Status: DC

## 2015-03-20 MED ORDER — METFORMIN HCL 1000 MG PO TABS: 1000.0000 mg | ORAL_TABLET | Freq: Two times a day (BID) | ORAL | Status: DC

## 2015-03-20 NOTE — Progress Notes (Signed)
Subjective:    Patient ID: Tracy Price, female    DOB: 06-Mar-1956, 59 y.o.   MRN: [id number]  HPI 03/14/15  patient has not been seen in almost a year. She is overdue for fasting lab work. She has a history of diabetes mellitus which is currently diet controlled. She denies any polyuria, polydipsia, blurred vision. She denies any shortness of breath, chest pain or dyspnea on exertion. She is also due for fasting lipid panel. She denies any myalgias or right upper quadrant pain.   She is overdue for a colonoscopy as well as a mammogram. She is also due for a Pap smear.  At that time, my plan was:  patient's blood pressure is excellent. I will make no changes in her blood pressure medication. I will check a fasting lipid panel. Goal LDL cholesterol is less than 100. I will also check a hemoglobin A1c. Goal hemoglobin A1c is less than 6.5. I recommended a mammogram, Pap smear, and a colonoscopy.  Also recommended pneumovax 23.  Start asa 81 mg poqday and get annual eye exam.   Patient would also like to start a stimulant medication for ADD. She states that she has a history of ADD. I am very concerned by the stimulant medication given her cardiovascular risk factors. I ecommended that she discuss this with the psychiatrist. She would like to defer having a colonoscopy, mammogram, Pap smear at the present time  03/20/15 Lab work revealed: Office Visit on 03/14/2015  Component Date Value Ref Range Status  . Sodium 03/14/2015 135  135 - 145 mEq/L Final  . Potassium 03/14/2015 3.2* 3.5 - 5.3 mEq/L Final  . Chloride 03/14/2015 93* 96 - 112 mEq/L Final  . CO2 03/14/2015 32  19 - 32 mEq/L Final  . Glucose, Bld 03/14/2015 260* 70 - 99 mg/dL Final  . BUN 03/14/2015 18  6 - 23 mg/dL Final  . Creat 03/14/2015 1.10  0.50 - 1.10 mg/dL Final  . Total Bilirubin 03/14/2015 0.6  0.2 - 1.2 mg/dL Final  . Alkaline Phosphatase 03/14/2015 123* 39 - 117 U/L Final  . AST 03/14/2015 15  0 - 37 U/L Final  . ALT  03/14/2015 12  0 - 35 U/L Final  . Total Protein 03/14/2015 7.5  6.0 - 8.3 g/dL Final  . Albumin 03/14/2015 4.1  3.5 - 5.2 g/dL Final  . Calcium 03/14/2015 9.5  8.4 - 10.5 mg/dL Final  . GFR, Est African American 03/14/2015 64   Final  . GFR, Est Non African American 03/14/2015 55*  Final   Comment:   The estimated GFR is a calculation valid for adults (>=40 years old) that uses the CKD-EPI algorithm to adjust for age and sex. It is   not to be used for children, pregnant women, hospitalized patients,    patients on dialysis, or with rapidly changing kidney function. According to the NKDEP, eGFR >89 is normal, 60-89 shows mild impairment, 30-59 shows moderate impairment, 15-29 shows severe impairment and <15 is ESRD.     . WBC 03/14/2015 7.7  4.0 - 10.5 K/uL Final  . RBC 03/14/2015 4.77  3.87 - 5.11 MIL/uL Final  . Hemoglobin 03/14/2015 14.1  12.0 - 15.0 g/dL Final  . HCT 03/14/2015 41.4  36.0 - 46.0 % Final  . MCV 03/14/2015 86.8  78.0 - 100.0 fL Final  . MCH 03/14/2015 29.6  26.0 - 34.0 pg Final  . MCHC 03/14/2015 34.1  30.0 - 36.0 g/dL Final  . RDW 03/14/2015  13.4  11.5 - 15.5 % Final  . Platelets 03/14/2015 281  150 - 400 K/uL Final  . MPV 03/14/2015 9.8  8.6 - 12.4 fL Final  . Neutrophils Relative % 03/14/2015 63  43 - 77 % Final  . Neutro Abs 03/14/2015 4.9  1.7 - 7.7 K/uL Final  . Lymphocytes Relative 03/14/2015 22  12 - 46 % Final  . Lymphs Abs 03/14/2015 1.7  0.7 - 4.0 K/uL Final  . Monocytes Relative 03/14/2015 7  3 - 12 % Final  . Monocytes Absolute 03/14/2015 0.5  0.1 - 1.0 K/uL Final  . Eosinophils Relative 03/14/2015 7* 0 - 5 % Final  . Eosinophils Absolute 03/14/2015 0.5  0.0 - 0.7 K/uL Final  . Basophils Relative 03/14/2015 1  0 - 1 % Final  . Basophils Absolute 03/14/2015 0.1  0.0 - 0.1 K/uL Final  . Smear Review 03/14/2015 Criteria for review not met   Final  . Cholesterol 03/14/2015 220* 0 - 200 mg/dL Final   Comment: ATP III Classification:       < 200         mg/dL        Desirable      200 - 239     mg/dL        Borderline High      >= 240        mg/dL        High     . Triglycerides 03/14/2015 171* <150 mg/dL Final  . HDL 03/14/2015 50  >=46 mg/dL Final   ** Please note change in reference range(s). **  . Total CHOL/HDL Ratio 03/14/2015 4.4   Final  . VLDL 03/14/2015 34  0 - 40 mg/dL Final  . LDL Cholesterol 03/14/2015 136* 0 - 99 mg/dL Final   Comment:   Total Cholesterol/HDL Ratio:CHD Risk                        Coronary Heart Disease Risk Table                                        Men       Women          1/2 Average Risk              3.4        3.3              Average Risk              5.0        4.4           2X Average Risk              9.6        7.1           3X Average Risk             23.4       11.0 Use the calculated Patient Ratio above and the CHD Risk table  to determine the patient's CHD Risk. ATP III Classification (LDL):       < 100        mg/dL         Optimal      100 - 129     mg/dL  Near or Above Optimal      130 - 159     mg/dL         Borderline High      160 - 189     mg/dL         High       > 190        mg/dL         Very High     . Hgb A1c MFr Bld 03/14/2015 13.0* <5.7 % Final   Comment:                                                                        According to the ADA Clinical Practice Recommendations for 2011, when HbA1c is used as a screening test:     >=6.5%   Diagnostic of Diabetes Mellitus            (if abnormal result is confirmed)   5.7-6.4%   Increased risk of developing Diabetes Mellitus   References:Diagnosis and Classification of Diabetes Mellitus,Diabetes ENMM,7680,88(PJSRP 1):S62-S69 and Standards of Medical Care in         Diabetes - 2011,Diabetes RXYV,8592,92 (Suppl 1):S11-S61.     . Mean Plasma Glucose 03/14/2015 326* <117 mg/dL Final  . Microalb, Ur 03/14/2015 0.7  <2.0 mg/dL Final   Comment: The ADA (Diabetes Care 4462;86(NOTRR 1):S14-S80) has  defined abnormalities in albumin excretion as follows:            Category           Result                            (mg/g creatinine)                 Normal:    <30       Microalbuminuria:    30 - 299   Clinical albuminuria:    > or = 300    The ADA recommends that at least two of three specimens collected within a 3 - 6 month period be abnormal before considering a patient to be within a diagnostic category.    Patient is very resistant to taking insulin even though her blood sugar is out of control. She admits that she has been eating cereal and ice cream and lots of carbohydrates. She would like to try pill medication and make drastic lifestyle changes rather than take insulin. Her cholesterol is also elevated. Past Medical History  Diagnosis Date  . Diabetes mellitus without complication     type 2  . Hyperlipidemia   . Hypertension   . Sleep apnea 1995    had surgery to correct  . Chest pain    Past Surgical History  Procedure Laterality Date  . Gastric bypass  2005   Current Outpatient Prescriptions on File Prior to Visit  Medication Sig Dispense Refill  . amLODipine (NORVASC) 10 MG tablet TAKE 1 TABLET BY MOUTH EVERY DAY 30 tablet 11  . ibuprofen (ADVIL,MOTRIN) 200 MG tablet Take 400 mg by mouth at bedtime as needed for pain.    Marland Kitchen losartan-hydrochlorothiazide (HYZAAR) 100-25 MG per tablet TAKE 1 TABLET BY MOUTH EVERY DAY 30  tablet 5  . Melatonin 1 MG TABS Take by mouth at bedtime as needed.    . pravastatin (PRAVACHOL) 40 MG tablet TAKE 1 TABLET BY MOUTH ONCE A DAY 90 tablet 1  . sertraline (ZOLOFT) 100 MG tablet TAKE 1 TABLET BY MOUTH EVERY DAY 90 tablet 0  . traMADol (ULTRAM) 50 MG tablet Take 1 tablet (50 mg total) by mouth every 6 (six) hours as needed. for pain 180 tablet 1   No current facility-administered medications on file prior to visit.   No Known Allergies History   Social History  . Marital Status: Single    Spouse Name: N/A  . Number of  Children: N/A  . Years of Education: N/A   Occupational History  . Not on file.   Social History Main Topics  . Smoking status: Never Smoker   . Smokeless tobacco: Never Used  . Alcohol Use: No  . Drug Use: No  . Sexual Activity: Not on file   Other Topics Concern  . Not on file   Social History Narrative      Review of Systems  All other systems reviewed and are negative.      Objective:   Physical Exam  Constitutional: She appears well-developed and well-nourished.  Neck: No JVD present. No thyromegaly present.  Cardiovascular: Normal rate, regular rhythm, normal heart sounds and intact distal pulses.   No murmur heard. Pulmonary/Chest: Effort normal and breath sounds normal. No respiratory distress. She has no wheezes. She has no rales.  Abdominal: Soft. Bowel sounds are normal. She exhibits no distension. There is no tenderness. There is no rebound and no guarding.  Musculoskeletal: She exhibits no edema.  Lymphadenopathy:    She has no cervical adenopathy.  Vitals reviewed.         Assessment & Plan:  Diabetes mellitus type II, uncontrolled  begin metformin 1000 mg by mouth twice a day. Also begin glyxambi 5/25 by mouth daily. Recheck fasting and two-hour postprandial blood sugars in one month. If the patient is not making substantial improvement in one month, we will revisit insulin at that time.

## 2015-04-17 ENCOUNTER — Telehealth: Payer: Self-pay | Admitting: Family Medicine

## 2015-04-17 NOTE — Telephone Encounter (Signed)
[phone] Pt is wanting to speak to you about the medication Empagliflozin-Linagliptin (GLYXAMBI) 25-5 MG TABS she states that when she takes it in the morning she takes it with food and then about an hour later it makes her feel sick on her stomach and she is wanting to speak to you to see if there is something else that she can take.

## 2015-04-18 NOTE — Telephone Encounter (Signed)
Given her hga1c, next step would be insulin.

## 2015-04-18 NOTE — Telephone Encounter (Signed)
Pt aware and will try to take at night and if that does not help will make a f/u appt to discuss further

## 2015-04-25 ENCOUNTER — Other Ambulatory Visit: Payer: Self-pay | Admitting: Family Medicine

## 2015-05-18 ENCOUNTER — Other Ambulatory Visit: Payer: Self-pay | Admitting: Family Medicine

## 2015-05-18 ENCOUNTER — Encounter: Payer: Self-pay | Admitting: Family Medicine

## 2015-05-18 NOTE — Telephone Encounter (Signed)
Medication refill for one time only.  Patient needs to be seen.  Letter sent for patient to call and schedule 

## 2015-06-14 ENCOUNTER — Other Ambulatory Visit: Payer: Self-pay | Admitting: Family Medicine

## 2015-06-28 ENCOUNTER — Other Ambulatory Visit: Payer: Self-pay | Admitting: Family Medicine

## 2015-07-18 ENCOUNTER — Other Ambulatory Visit: Payer: Self-pay | Admitting: Family Medicine

## 2015-07-19 NOTE — Telephone Encounter (Signed)
Refill appropriate and filled per protocol. 

## 2015-08-31 ENCOUNTER — Other Ambulatory Visit: Payer: Self-pay | Admitting: Family Medicine

## 2015-09-04 NOTE — Telephone Encounter (Signed)
Ok, she is probably close to due for ov for diabetes

## 2015-09-04 NOTE — Telephone Encounter (Signed)
Ok to refill??  Last office visit 03/20/2015.  Last refill 03/14/2015, #1 refill.

## 2015-09-04 NOTE — Telephone Encounter (Signed)
?   OK to Refill  

## 2015-09-04 NOTE — Telephone Encounter (Signed)
ok 

## 2015-09-06 ENCOUNTER — Other Ambulatory Visit: Payer: Self-pay | Admitting: Family Medicine

## 2015-09-06 DIAGNOSIS — Z1231 Encounter for screening mammogram for malignant neoplasm of breast: Secondary | ICD-10-CM

## 2015-09-11 ENCOUNTER — Ambulatory Visit
Admission: RE | Admit: 2015-09-11 | Discharge: 2015-09-11 | Disposition: A | Discharge status: Home / Self Care | Payer: BC Managed Care – PPO | Source: Ambulatory Visit | Attending: Family Medicine | Admitting: Family Medicine

## 2015-09-11 DIAGNOSIS — Z1231 Encounter for screening mammogram for malignant neoplasm of breast: Secondary | ICD-10-CM

## 2015-09-21 ENCOUNTER — Ambulatory Visit: Payer: Self-pay

## 2015-10-24 ENCOUNTER — Other Ambulatory Visit: Payer: Self-pay | Admitting: Family Medicine

## 2015-10-24 NOTE — Telephone Encounter (Signed)
Refill appropriate and filled per protocol. 

## 2015-11-13 ENCOUNTER — Telehealth: Payer: Self-pay | Admitting: Family Medicine

## 2015-11-13 NOTE — Telephone Encounter (Signed)
Rec'd PA request for Glyxambi 25/5.  Submitted thru [place]  [id number].  Awaiting response.

## 2015-11-14 NOTE — Telephone Encounter (Signed)
rec'd approval for Glyxambi from [place].  Valid 11/13/15 - 03/12/2017.  Pharmacy made aware  PA# [id number] non-grandfathered [id number] [facility]

## 2015-11-20 ENCOUNTER — Other Ambulatory Visit: Payer: Self-pay | Admitting: Family Medicine

## 2015-11-20 NOTE — Telephone Encounter (Signed)
Ok to refill 

## 2015-11-20 NOTE — Telephone Encounter (Signed)
ok 

## 2015-11-21 NOTE — Telephone Encounter (Signed)
Medication called to pharmacy. 

## 2015-12-21 ENCOUNTER — Ambulatory Visit (INDEPENDENT_AMBULATORY_CARE_PROVIDER_SITE_OTHER): Payer: BC Managed Care – PPO | Admitting: Family Medicine

## 2015-12-21 ENCOUNTER — Encounter: Payer: Self-pay | Admitting: Family Medicine

## 2015-12-21 VITALS — BP 110/80 | HR 72 | Temp 97.6°F | Resp 16 | Ht 61.0 in | Wt 124.0 lb

## 2015-12-21 DIAGNOSIS — E1100 Type 2 diabetes mellitus with hyperosmolarity without nonketotic hyperglycemic-hyperosmolar coma (NKHHC): Secondary | ICD-10-CM | POA: Diagnosis not present

## 2015-12-21 DIAGNOSIS — R634 Abnormal weight loss: Secondary | ICD-10-CM | POA: Diagnosis not present

## 2015-12-21 LAB — CBC WITH DIFFERENTIAL/PLATELET
BASOS ABS: 0.1 10*3/uL (ref 0.0–0.1)
BASOS PCT: 1 % (ref 0–1)
Eosinophils Absolute: 0.6 10*3/uL (ref 0.0–0.7)
Eosinophils Relative: 8 % — ABNORMAL HIGH (ref 0–5)
HCT: 35.4 % — ABNORMAL LOW (ref 36.0–46.0)
Hemoglobin: 12.3 g/dL (ref 12.0–15.0)
LYMPHS ABS: 2 10*3/uL (ref 0.7–4.0)
Lymphocytes Relative: 28 % (ref 12–46)
MCH: 30.9 pg (ref 26.0–34.0)
MCHC: 34.7 g/dL (ref 30.0–36.0)
MCV: 88.9 fL (ref 78.0–100.0)
MPV: 10.6 fL (ref 8.6–12.4)
Monocytes Absolute: 0.6 10*3/uL (ref 0.1–1.0)
Monocytes Relative: 8 % (ref 3–12)
NEUTROS ABS: 3.9 10*3/uL (ref 1.7–7.7)
NEUTROS PCT: 55 % (ref 43–77)
PLATELETS: 247 10*3/uL (ref 150–400)
RBC: 3.98 MIL/uL (ref 3.87–5.11)
RDW: 13.7 % (ref 11.5–15.5)
WBC: 7 10*3/uL (ref 4.0–10.5)

## 2015-12-21 LAB — COMPLETE METABOLIC PANEL WITH GFR
ALK PHOS: 61 U/L (ref 33–130)
ALT: 22 U/L (ref 6–29)
AST: 16 U/L (ref 10–35)
Albumin: 4.2 g/dL (ref 3.6–5.1)
BUN: 30 mg/dL — ABNORMAL HIGH (ref 7–25)
CO2: 25 mmol/L (ref 20–31)
CREATININE: 1.61 mg/dL — AB (ref 0.50–1.05)
Calcium: 9.6 mg/dL (ref 8.6–10.4)
Chloride: 97 mmol/L — ABNORMAL LOW (ref 98–110)
GFR, Est African American: 40 mL/min — ABNORMAL LOW (ref >=60)
GFR, Est Non African American: 35 mL/min — ABNORMAL LOW (ref >=60)
GLUCOSE: 89 mg/dL (ref 70–99)
Potassium: 3.4 mmol/L — ABNORMAL LOW (ref 3.5–5.3)
SODIUM: 140 mmol/L (ref 135–146)
TOTAL PROTEIN: 7.4 g/dL (ref 6.1–8.1)
Total Bilirubin: 0.4 mg/dL (ref 0.2–1.2)

## 2015-12-21 LAB — LIPID PANEL
Cholesterol: 247 mg/dL — ABNORMAL HIGH (ref 125–200)
HDL: 62 mg/dL (ref >=46)
LDL CALC: 162 mg/dL — AB (ref <130)
TRIGLYCERIDES: 115 mg/dL (ref <150)
Total CHOL/HDL Ratio: 4 Ratio (ref <=5.0)
VLDL: 23 mg/dL (ref <30)

## 2015-12-21 LAB — TSH: TSH: 4.8 m[IU]/L — AB

## 2015-12-21 NOTE — Progress Notes (Signed)
Subjective:    Patient ID: Tracy Price, female    DOB: 05/20/1956, 60 y.o.   MRN: [id number]  HPI Patient has not been seen since June of last year. At that time she had uncontrolled diabetes mellitus type 2 with a hemoglobin A1c of 13. She has not followed up since. In the interval time she has lost 40 pounds. She states that over the last month, she has developed fatigue and lethargy. She denies any chest pain or shortness of breath. She denies any hypoglycemic episodes. She states that her sugars are running between 80 and 110 when she checks them. She denies any polyuria polydipsia or blurred vision. She does complain of some orthostatic dizziness. Colonoscopy was 7 years ago was normal. Mammogram was just performed in December and was normal. She denies any black tarry stool or blood in her stool. She denies any cough or hemoptysis. Thyroid gland feels normal on examination today and there is no lymphadenopathy palpable on exam. She denies any fevers or chills although she did have a flulike illness one month ago Past Medical History  Diagnosis Date  . Diabetes mellitus without complication ([place])     type 2  . Hyperlipidemia   . Hypertension   . Sleep apnea 1995    had surgery to correct  . Chest pain    Past Surgical History  Procedure Laterality Date  . Gastric bypass  2005   Current Outpatient Prescriptions on File Prior to Visit  Medication Sig Dispense Refill  . amLODipine (NORVASC) 10 MG tablet TAKE 1 TABLET BY MOUTH EVERY DAY 90 tablet 3  . blood glucose meter kit and supplies Dispense based on patient and insurance preference. Use up to 3 times a day for e11.65 1 each 0  . Empagliflozin-Linagliptin (GLYXAMBI) 25-5 MG TABS Take 1 tablet by mouth daily. 30 tablet 11  . FREESTYLE LITE test strip USE AS DIRECTED 3 TIMES A DAY 100 each 11  . ibuprofen (ADVIL,MOTRIN) 200 MG tablet Take 400 mg by mouth at bedtime as needed for pain.    Marland Kitchen losartan-hydrochlorothiazide (HYZAAR)  100-25 MG per tablet TAKE 1 TABLET BY MOUTH EVERY DAY 30 tablet 11  . Melatonin 1 MG TABS Take by mouth at bedtime as needed.    . metFORMIN (GLUCOPHAGE) 1000 MG tablet Take 1 tablet (1,000 mg total) by mouth 2 (two) times daily with a meal. 180 tablet 3  . pravastatin (PRAVACHOL) 40 MG tablet TAKE 1 TABLET BY MOUTH ONCE A DAY 90 tablet 1  . traMADol (ULTRAM) 50 MG tablet TAKE 1 TABLET BY MOUTH EVERY 6 HOURS AS NEEDED FOR PAIN 180 tablet 0   No current facility-administered medications on file prior to visit.   No Known Allergies Social History   Social History  . Marital Status: Single    Spouse Name: N/A  . Number of Children: N/A  . Years of Education: N/A   Occupational History  . Not on file.   Social History Main Topics  . Smoking status: Never Smoker   . Smokeless tobacco: Never Used  . Alcohol Use: No  . Drug Use: No  . Sexual Activity: Not on file   Other Topics Concern  . Not on file   Social History Narrative      Review of Systems  All other systems reviewed and are negative.      Objective:   Physical Exam  Constitutional: She appears well-developed and well-nourished.  HENT:  Right Ear: External ear normal.  Left Ear: External ear normal.  Nose: Nose normal.  Mouth/Throat: Oropharynx is clear and moist.  Eyes: Conjunctivae are normal.  Neck: Neck supple. No thyromegaly present.  Cardiovascular: Normal rate, regular rhythm and normal heart sounds.  Exam reveals no gallop and no friction rub.   No murmur heard. Pulmonary/Chest: Effort normal and breath sounds normal. No respiratory distress. She has no wheezes. She has no rales.  Abdominal: Soft. Bowel sounds are normal. She exhibits no distension and no mass. There is no tenderness. There is no rebound and no guarding.  Musculoskeletal: She exhibits no edema.  Lymphadenopathy:    She has no cervical adenopathy.  Skin: No rash noted.  Vitals reviewed.         Assessment & Plan:  Loss of  weight - Plan: TSH  Uncontrolled type 2 diabetes mellitus with hyperosmolarity without coma, without long-term current use of insulin ([place]) - Plan: CBC with Differential/Platelet, COMPLETE METABOLIC PANEL WITH GFR, Lipid panel, Hemoglobin A1c  I am very concerned by the patient's loss of weight. She states this because she is better conscious effort to change her diet. She denies any symptoms concerning for malignancy other than the fatigue and lethargy. I will obtain baseline lab work. I will check a thyroid, CBC, TSH, CMP, fasting lipid panel, and hemoglobin A1c. It is possible that with the rapid loss of weight, she is overdosed on her blood pressure medication and her fatigue could simply be hypertension. It is also possible she may need drastic reductions in her diabetic medications to avoid hypoglycemia. I will await the results of her lab work to make a determination. I recently malignancy is in the back of our mind with weight loss. She is also complaining of pain in her right shoulder. The pain has been present for more than a year. It hurts with internal and no rotation. It hurts in the armpit with internal and external rotation. She has no pain with abduction. I suspect the subscapularis strain or partial tear. However I will defer an MRI or x-rays and lab work is back.

## 2015-12-22 ENCOUNTER — Other Ambulatory Visit: Payer: Self-pay | Admitting: Family Medicine

## 2015-12-22 LAB — HEMOGLOBIN A1C
Hgb A1c MFr Bld: 6 % — ABNORMAL HIGH (ref <5.7)
MEAN PLASMA GLUCOSE: 126 mg/dL — AB (ref <117)

## 2015-12-22 MED ORDER — LOSARTAN POTASSIUM 100 MG PO TABS: 100.0000 mg | ORAL_TABLET | Freq: Every day | ORAL | Status: DC

## 2016-01-03 ENCOUNTER — Other Ambulatory Visit: Payer: Self-pay | Admitting: Family Medicine

## 2016-01-03 MED ORDER — ONETOUCH DELICA LANCETS 33G MISC: Status: DC

## 2016-01-03 MED ORDER — GLUCOSE BLOOD VI STRP: ORAL_STRIP | Status: DC

## 2016-01-05 ENCOUNTER — Other Ambulatory Visit: Payer: Self-pay | Admitting: Family Medicine

## 2016-01-05 NOTE — Telephone Encounter (Signed)
Ok to refill??  Last office visit 12/21/2015.   Last refill 11/21/2015.

## 2016-01-08 NOTE — Telephone Encounter (Signed)
Medication called to pharmacy. 

## 2016-01-08 NOTE — Telephone Encounter (Signed)
ok 

## 2016-01-15 ENCOUNTER — Encounter: Payer: Self-pay | Admitting: Family Medicine

## 2016-01-15 ENCOUNTER — Ambulatory Visit (INDEPENDENT_AMBULATORY_CARE_PROVIDER_SITE_OTHER): Payer: BC Managed Care – PPO | Admitting: Family Medicine

## 2016-01-15 VITALS — BP 130/82 | HR 66 | Temp 98.4°F | Resp 14 | Ht 61.0 in | Wt 128.0 lb

## 2016-01-15 DIAGNOSIS — I10 Essential (primary) hypertension: Secondary | ICD-10-CM | POA: Diagnosis not present

## 2016-01-15 DIAGNOSIS — S46811D Strain of other muscles, fascia and tendons at shoulder and upper arm level, right arm, subsequent encounter: Secondary | ICD-10-CM

## 2016-01-15 DIAGNOSIS — S4381XD Sprain of other specified parts of right shoulder girdle, subsequent encounter: Secondary | ICD-10-CM

## 2016-01-15 MED ORDER — LOSARTAN POTASSIUM 100 MG PO TABS: 100.0000 mg | ORAL_TABLET | Freq: Every day | ORAL | Status: DC

## 2016-01-15 NOTE — Addendum Note (Signed)
Addended by: Shary Decamp B on: 01/15/2016 02:41 PM   Modules accepted: Orders

## 2016-01-15 NOTE — Progress Notes (Signed)
Subjective:    Patient ID: Tracy Price, female    DOB: 18-Feb-1956, 60 y.o.   MRN: [id number]  HPI 12/21/15 Patient has not been seen since June of last year. At that time she had uncontrolled diabetes mellitus type 2 with a hemoglobin A1c of 13. She has not followed up since. In the interval time she has lost 40 pounds. She states that over the last month, she has developed fatigue and lethargy. She denies any chest pain or shortness of breath. She denies any hypoglycemic episodes. She states that her sugars are running between 80 and 110 when she checks them. She denies any polyuria polydipsia or blurred vision. She does complain of some orthostatic dizziness. Colonoscopy was 7 years ago was normal. Mammogram was just performed in December and was normal. She denies any black tarry stool or blood in her stool. She denies any cough or hemoptysis. Thyroid gland feels normal on examination today and there is no lymphadenopathy palpable on exam. She denies any fevers or chills although she did have a flulike illness one month ago.  At that time, my plan was: I am very concerned by the patient's loss of weight. She states this because she is better conscious effort to change her diet. She denies any symptoms concerning for malignancy other than the fatigue and lethargy. I will obtain baseline lab work. I will check a thyroid, CBC, TSH, CMP, fasting lipid panel, and hemoglobin A1c. It is possible that with the rapid loss of weight, she is overdosed on her blood pressure medication and her fatigue could simply be hypotension. It is also possible she may need drastic reductions in her diabetic medications to avoid hypoglycemia. I will await the results of her lab work to make a determination. Malignancy is in the back of our mind with weight loss. She is also complaining of pain in her right shoulder. The pain has been present for more than a year. It hurts with internal and no rotation. It hurts in the armpit  with internal and external rotation. She has no pain with abduction. I suspect the subscapularis strain or partial tear. However I will defer an MRI or x-rays and lab work is back.  01/15/16 Office Visit on 12/21/2015  Component Date Value Ref Range Status  . WBC 12/21/2015 7.0  4.0 - 10.5 K/uL Final  . RBC 12/21/2015 3.98  3.87 - 5.11 MIL/uL Final  . Hemoglobin 12/21/2015 12.3  12.0 - 15.0 g/dL Final  . HCT 12/21/2015 35.4* 36.0 - 46.0 % Final  . MCV 12/21/2015 88.9  78.0 - 100.0 fL Final  . MCH 12/21/2015 30.9  26.0 - 34.0 pg Final  . MCHC 12/21/2015 34.7  30.0 - 36.0 g/dL Final  . RDW 12/21/2015 13.7  11.5 - 15.5 % Final  . Platelets 12/21/2015 247  150 - 400 K/uL Final  . MPV 12/21/2015 10.6  8.6 - 12.4 fL Final  . Neutrophils Relative % 12/21/2015 55  43 - 77 % Final  . Neutro Abs 12/21/2015 3.9  1.7 - 7.7 K/uL Final  . Lymphocytes Relative 12/21/2015 28  12 - 46 % Final  . Lymphs Abs 12/21/2015 2.0  0.7 - 4.0 K/uL Final  . Monocytes Relative 12/21/2015 8  3 - 12 % Final  . Monocytes Absolute 12/21/2015 0.6  0.1 - 1.0 K/uL Final  . Eosinophils Relative 12/21/2015 8* 0 - 5 % Final  . Eosinophils Absolute 12/21/2015 0.6  0.0 - 0.7 K/uL Final  . Basophils  Relative 12/21/2015 1  0 - 1 % Final  . Basophils Absolute 12/21/2015 0.1  0.0 - 0.1 K/uL Final  . Smear Review 12/21/2015 Criteria for review not met   Final  . Sodium 12/21/2015 140  135 - 146 mmol/L Final  . Potassium 12/21/2015 3.4* 3.5 - 5.3 mmol/L Final  . Chloride 12/21/2015 97* 98 - 110 mmol/L Final  . CO2 12/21/2015 25  20 - 31 mmol/L Final  . Glucose, Bld 12/21/2015 89  70 - 99 mg/dL Final  . BUN 12/21/2015 30* 7 - 25 mg/dL Final  . Creat 12/21/2015 1.61* 0.50 - 1.05 mg/dL Final  . Total Bilirubin 12/21/2015 0.4  0.2 - 1.2 mg/dL Final  . Alkaline Phosphatase 12/21/2015 61  33 - 130 U/L Final  . AST 12/21/2015 16  10 - 35 U/L Final  . ALT 12/21/2015 22  6 - 29 U/L Final  . Total Protein 12/21/2015 7.4  6.1 - 8.1 g/dL  Final  . Albumin 12/21/2015 4.2  3.6 - 5.1 g/dL Final  . Calcium 12/21/2015 9.6  8.6 - 10.4 mg/dL Final  . GFR, Est African American 12/21/2015 40* >=60 mL/min Final  . GFR, Est Non African American 12/21/2015 35* >=60 mL/min Final   Comment:   The estimated GFR is a calculation valid for adults (>=65 years old) that uses the CKD-EPI algorithm to adjust for age and sex. It is   not to be used for children, pregnant women, hospitalized patients,    patients on dialysis, or with rapidly changing kidney function. According to the NKDEP, eGFR >89 is normal, 60-89 shows mild impairment, 30-59 shows moderate impairment, 15-29 shows severe impairment and <15 is ESRD.     Marland Kitchen Cholesterol 12/21/2015 247* 125 - 200 mg/dL Final  . Triglycerides 12/21/2015 115  <150 mg/dL Final  . HDL 12/21/2015 62  >=46 mg/dL Final  . Total CHOL/HDL Ratio 12/21/2015 4.0  <=5.0 Ratio Final  . VLDL 12/21/2015 23  <30 mg/dL Final  . LDL Cholesterol 12/21/2015 162* <130 mg/dL Final   Comment:   Total Cholesterol/HDL Ratio:CHD Risk                        Coronary Heart Disease Risk Table                                        Men       Women          1/2 Average Risk              3.4        3.3              Average Risk              5.0        4.4           2X Average Risk              9.6        7.1           3X Average Risk             23.4       11.0 Use the calculated Patient Ratio above and the CHD Risk table  to determine the patient's CHD Risk.   . Hgb A1c MFr Bld 12/21/2015 6.0* <  5.7 % Final   Comment:                                                                        According to the ADA Clinical Practice Recommendations for 2011, when HbA1c is used as a screening test:     >=6.5%   Diagnostic of Diabetes Mellitus            (if abnormal result is confirmed)   5.7-6.4%   Increased risk of developing Diabetes Mellitus   References:Diagnosis and Classification of Diabetes  [place],Diabetes OEVO,3500,93(GHWEX 1):S62-S69 and Standards of Medical Care in         Diabetes - 2011,Diabetes HBZJ,6967,89 (Suppl 1):S11-S61.     . Mean Plasma Glucose 12/21/2015 126* <117 mg/dL Final  . TSH 12/21/2015 4.80*  Final   Comment:   Reference Range   > or = 20 Years  0.40-4.50   Pregnancy Range First trimester  0.26-2.66 Second trimester 0.55-2.73 Third trimester  0.43-2.91      After her lab work returned: I recommended she stop metformin and drink more fluids and recheck BMP at [facility] in 3 weeks. Due to weight loss, I felt her BP was too low causing her to feel poorly. I stopped amlodipine and hyzaar, and replaced only with losartan 100 mg poqday. She is here today to recheck. I did not address her elevated LDL cholesterol. I wanted to discuss that today  She feels much better today. She has gained 4 pounds which I believe is with hydration. Her blood pressure was okay. She does not feel as weak and is lightheaded since discontinuing amlodipine and hydrochlorothiazide. She has had no further hypoglycemic episodes since discontinuing metformin. She is due to recheck her kidney function which I suspect will be much better now. She admits that she has not been taking pravastatin for the last 3-4 weeks. This may explain the LDL cholesterol greater than 160. Unfortunately the pain in her right shoulder continues to get worse. She has tremendous pain with internal rotation against resistance. The pain is underneath her shoulder blade and radiates into her right breast. It is exacerbated by internal rotation and external rotation. Past Medical History  Diagnosis Date  . Diabetes mellitus without complication ([place])     type 2  . Hyperlipidemia   . Hypertension   . Sleep apnea 1995    had surgery to correct  . Chest pain    Past Surgical History  Procedure Laterality Date  . Gastric bypass  2005   Current Outpatient Prescriptions on File Prior to Visit  Medication Sig  Dispense Refill  . blood glucose meter kit and supplies Dispense based on patient and insurance preference. Use up to 3 times a day for e11.65 1 each 0  . Empagliflozin-Linagliptin (GLYXAMBI) 25-5 MG TABS Take 1 tablet by mouth daily. 30 tablet 11  . glucose blood test strip Use as instructed 200 each 4  . ibuprofen (ADVIL,MOTRIN) 200 MG tablet Take 400 mg by mouth at bedtime as needed for pain.    Marland Kitchen losartan (COZAAR) 100 MG tablet Take 1 tablet (100 mg total) by mouth daily. 30 tablet 0  . Melatonin 1 MG TABS Take by mouth at bedtime as needed.    Marland Kitchen  ONETOUCH DELICA LANCETS 14G MISC Checks BS TID 200 each 4  . pravastatin (PRAVACHOL) 40 MG tablet TAKE 1 TABLET BY MOUTH ONCE A DAY 90 tablet 1  . traMADol (ULTRAM) 50 MG tablet TAKE 1 TABLET BY MOUTH EVERY 6 HOURS AS NEEDED FOR PAIN 180 tablet 0   No current facility-administered medications on file prior to visit.   No Known Allergies Social History   Social History  . Marital Status: Single    Spouse Name: N/A  . Number of Children: N/A  . Years of Education: N/A   Occupational History  . Not on file.   Social History Main Topics  . Smoking status: Never Smoker   . Smokeless tobacco: Never Used  . Alcohol Use: No  . Drug Use: No  . Sexual Activity: Not on file   Other Topics Concern  . Not on file   Social History Narrative      Review of Systems  All other systems reviewed and are negative.      Objective:   Physical Exam  Constitutional: She appears well-developed and well-nourished.  HENT:  Right Ear: External ear normal.  Left Ear: External ear normal.  Nose: Nose normal.  Mouth/Throat: Oropharynx is clear and moist.  Eyes: Conjunctivae are normal.  Neck: Neck supple. No thyromegaly present.  Cardiovascular: Normal rate, regular rhythm and normal heart sounds.  Exam reveals no gallop and no friction rub.   No murmur heard. Pulmonary/Chest: Effort normal and breath sounds normal. No respiratory distress. She  has no wheezes. She has no rales.  Abdominal: Soft. Bowel sounds are normal. She exhibits no distension and no mass. There is no tenderness. There is no rebound and no guarding.  Musculoskeletal: She exhibits no edema.  Lymphadenopathy:    She has no cervical adenopathy.  Skin: No rash noted.  Vitals reviewed.         Assessment & Plan:  Essential hypertension - Plan: BASIC METABOLIC PANEL WITH GFR  Partial tear subscapularis tendon, right, subsequent encounter - Plan: MR Shoulder Right Wo Contrast  Her blood pressure is much better. I will have the patient return in 3 months to recheck a hemoglobin A1c, CMP and a fasting lipid panel. She was clearly overmedicated. I believe the majority of her symptoms are due to low blood pressure and dehydration. I will schedule the patient for an MRI of her right shoulder as I believe she has a partial tear in her subscapularis muscle. This occurred more than one year ago when she tried to catch her mother who was falling. Her mother died last 10-23-2022. The pain has been there ever since but is gradually worsened. Therefore I believe that time is of the essence to order an MRI because she may be reaching a point where surgeons cannot help her if there was a partial tear.

## 2016-01-16 ENCOUNTER — Other Ambulatory Visit: Payer: Self-pay | Admitting: Family Medicine

## 2016-01-16 ENCOUNTER — Encounter: Payer: Self-pay | Admitting: Family Medicine

## 2016-01-16 LAB — BASIC METABOLIC PANEL WITH GFR
BUN: 19 mg/dL (ref 7–25)
CALCIUM: 9 mg/dL (ref 8.6–10.4)
CHLORIDE: 105 mmol/L (ref 98–110)
CO2: 23 mmol/L (ref 20–31)
CREATININE: 1.52 mg/dL — AB (ref 0.50–1.05)
GFR, Est African American: 43 mL/min — ABNORMAL LOW (ref >=60)
GFR, Est Non African American: 38 mL/min — ABNORMAL LOW (ref >=60)
Glucose, Bld: 64 mg/dL — ABNORMAL LOW (ref 70–99)
Potassium: 4.5 mmol/L (ref 3.5–5.3)
Sodium: 137 mmol/L (ref 135–146)

## 2016-01-16 NOTE — Telephone Encounter (Signed)
Refill appropriate and filled per protocol. 

## 2016-01-20 ENCOUNTER — Inpatient Hospital Stay: Admission: RE | Admit: 2016-01-20 | Payer: BC Managed Care – PPO | Source: Ambulatory Visit

## 2016-01-23 ENCOUNTER — Telehealth: Payer: Self-pay | Admitting: Family Medicine

## 2016-01-23 DIAGNOSIS — M25511 Pain in right shoulder: Secondary | ICD-10-CM

## 2016-01-23 NOTE — Telephone Encounter (Signed)
-----   Message from Susy Frizzle, MD sent at 01/22/2016  7:43 AM EDT ----- I cannot get MRI scheduled.  Therefore, I recommend ortho consult.

## 2016-01-23 NOTE — Telephone Encounter (Signed)
Pt is going to check with insurance to see who Ortho providers are and call me back.

## 2016-01-23 NOTE — Telephone Encounter (Signed)
MRI canceled and have placed ortho referral.  Have left message for patient to call me to see who prefers.

## 2016-02-01 ENCOUNTER — Telehealth: Payer: Self-pay | Admitting: Family Medicine

## 2016-02-01 NOTE — Telephone Encounter (Signed)
Patient is calling regarding a referral to ortho, for her right shoulder  Please call her at [phone]

## 2016-02-02 NOTE — Telephone Encounter (Signed)
Pt was to call me back and let me know where she wanted to go.  Left her message, still waiting for her to tell me.

## 2016-02-08 NOTE — Telephone Encounter (Signed)
Referral has been sent ot Dr Amedeo Plenty office

## 2016-02-28 ENCOUNTER — Other Ambulatory Visit: Payer: Self-pay | Admitting: Family Medicine

## 2016-02-28 NOTE — Telephone Encounter (Signed)
Ok to refill??  Last office visit 01/15/2016.  Last refill 01/08/2016.

## 2016-02-29 NOTE — Telephone Encounter (Signed)
ok 

## 2016-04-02 ENCOUNTER — Other Ambulatory Visit: Payer: Self-pay | Admitting: Family Medicine

## 2016-04-13 ENCOUNTER — Other Ambulatory Visit: Payer: Self-pay | Admitting: Family Medicine

## 2016-04-15 ENCOUNTER — Other Ambulatory Visit: Payer: Self-pay | Admitting: *Deleted

## 2016-04-15 MED ORDER — LOSARTAN POTASSIUM 100 MG PO TABS: 100.0000 mg | ORAL_TABLET | Freq: Every day | ORAL | Status: DC

## 2016-04-15 NOTE — Telephone Encounter (Signed)
Received fax requesting refill on Losartan.   Refill appropriate and filled per protocol. 

## 2016-04-17 ENCOUNTER — Other Ambulatory Visit: Payer: Self-pay | Admitting: Family Medicine

## 2016-04-17 MED ORDER — LOSARTAN POTASSIUM 100 MG PO TABS: 100.0000 mg | ORAL_TABLET | Freq: Every day | ORAL | Status: DC

## 2016-04-17 NOTE — Telephone Encounter (Signed)
Requesting 90 day supply - Medication called/sent to requested pharmacy

## 2016-04-20 ENCOUNTER — Encounter: Payer: Self-pay | Admitting: Family Medicine

## 2016-04-22 ENCOUNTER — Ambulatory Visit: Payer: BC Managed Care – PPO | Admitting: Family Medicine

## 2016-04-23 ENCOUNTER — Ambulatory Visit: Payer: Self-pay | Admitting: Family Medicine

## 2016-04-25 ENCOUNTER — Other Ambulatory Visit: Payer: Self-pay | Admitting: Family Medicine

## 2016-04-25 NOTE — Telephone Encounter (Signed)
ok 

## 2016-04-25 NOTE — Telephone Encounter (Signed)
Ok to refill 

## 2016-04-30 ENCOUNTER — Encounter: Payer: Self-pay | Admitting: Family Medicine

## 2016-04-30 ENCOUNTER — Ambulatory Visit (INDEPENDENT_AMBULATORY_CARE_PROVIDER_SITE_OTHER): Payer: BC Managed Care – PPO | Admitting: Family Medicine

## 2016-04-30 VITALS — BP 130/86 | HR 74 | Temp 98.1°F | Resp 16 | Wt 136.0 lb

## 2016-04-30 DIAGNOSIS — S46811D Strain of other muscles, fascia and tendons at shoulder and upper arm level, right arm, subsequent encounter: Secondary | ICD-10-CM

## 2016-04-30 DIAGNOSIS — E785 Hyperlipidemia, unspecified: Secondary | ICD-10-CM | POA: Diagnosis not present

## 2016-04-30 DIAGNOSIS — S4381XD Sprain of other specified parts of right shoulder girdle, subsequent encounter: Secondary | ICD-10-CM | POA: Diagnosis not present

## 2016-04-30 DIAGNOSIS — E119 Type 2 diabetes mellitus without complications: Secondary | ICD-10-CM

## 2016-04-30 DIAGNOSIS — I10 Essential (primary) hypertension: Secondary | ICD-10-CM | POA: Diagnosis not present

## 2016-04-30 NOTE — Progress Notes (Signed)
Subjective:    Patient ID: Tracy Price, female    DOB: 05-23-1956, 60 y.o.   MRN: [id number]  HPI3/16/17 Patient has not been seen since June of last year. At that time Tracy Price had uncontrolled diabetes mellitus type 2 with a hemoglobin A1c of 13. Tracy Price has not followed up since. In the interval time Tracy Price has lost 40 pounds. Tracy Price states that over the last month, Tracy Price has developed fatigue and lethargy. Tracy Price denies any chest pain or shortness of breath. Tracy Price denies any hypoglycemic episodes. Tracy Price states that Tracy Price sugars are running between 80 and 110 when Tracy Price checks them. Tracy Price denies any polyuria polydipsia or blurred vision. Tracy Price does complain of some orthostatic dizziness. Colonoscopy was 7 years ago was normal. Mammogram was just performed in December and was normal. Tracy Price denies any black tarry stool or blood in Tracy Price stool. Tracy Price denies any cough or hemoptysis. Thyroid gland feels normal on examination today and there is no lymphadenopathy palpable on exam. Tracy Price denies any fevers or chills although Tracy Price did have a flulike illness one month ago.  At that time, my plan was: I am very concerned by the patient's loss of weight. Tracy Price states this because Tracy Price is better conscious effort to change Tracy Price diet. Tracy Price denies any symptoms concerning for malignancy other than the fatigue and lethargy. I will obtain baseline lab work. I will check a thyroid, CBC, TSH, CMP, fasting lipid panel, and hemoglobin A1c. It is possible that with the rapid loss of weight, Tracy Price is overdosed on Tracy Price blood pressure medication and Tracy Price fatigue could simply be hypotension. It is also possible Tracy Price may need drastic reductions in Tracy Price diabetic medications to avoid hypoglycemia. I will await the results of Tracy Price lab work to make a determination. Malignancy is in the back of our mind with weight loss. Tracy Price is also complaining of pain in Tracy Price right shoulder. The pain has been present for more than a year. It hurts with internal and no rotation. It hurts in the armpit with  internal and external rotation. Tracy Price has no pain with abduction. I suspect the subscapularis strain or partial tear. However I will defer an MRI or x-rays and lab work is back.  01/15/16 No visits with results within 1 Month(s) from this visit.  Latest known visit with results is:  Office Visit on 01/15/2016  Component Date Value Ref Range Status  . Sodium 01/16/2016 137  135 - 146 mmol/L Final  . Potassium 01/16/2016 4.5  3.5 - 5.3 mmol/L Final  . Chloride 01/16/2016 105  98 - 110 mmol/L Final  . CO2 01/16/2016 23  20 - 31 mmol/L Final  . Glucose, Bld 01/16/2016 64* 70 - 99 mg/dL Final  . BUN 01/16/2016 19  7 - 25 mg/dL Final  . Creat 01/16/2016 1.52* 0.50 - 1.05 mg/dL Final  . Calcium 01/16/2016 9.0  8.6 - 10.4 mg/dL Final  . GFR, Est African American 01/16/2016 43* >=60 mL/min Final  . GFR, Est Non African American 01/16/2016 38* >=60 mL/min Final   Comment:   The estimated GFR is a calculation valid for adults (>=31 years old) that uses the CKD-EPI algorithm to adjust for age and sex. It is   not to be used for children, pregnant women, hospitalized patients,    patients on dialysis, or with rapidly changing kidney function. According to the NKDEP, eGFR >89 is normal, 60-89 shows mild impairment, 30-59 shows moderate impairment, 15-29 shows severe impairment and <15 is ESRD.  After Tracy Price lab work returned: I recommended Tracy Price stop metformin and drink more fluids and recheck BMP at [facility] in 3 weeks. Due to weight loss, I felt Tracy Price BP was too low causing Tracy Price to feel poorly. I stopped amlodipine and hyzaar, and replaced only with losartan 100 mg poqday. Tracy Price is here today to recheck. I did not address Tracy Price elevated LDL cholesterol. I wanted to discuss that today  Tracy Price feels much better today. Tracy Price has gained 4 pounds which I believe is with hydration. Tracy Price blood pressure was okay. Tracy Price does not feel as weak and is lightheaded since discontinuing amlodipine and hydrochlorothiazide. Tracy Price has had no  further hypoglycemic episodes since discontinuing metformin. Tracy Price is due to recheck Tracy Price kidney function which I suspect will be much better now. Tracy Price admits that Tracy Price has not been taking pravastatin for the last 3-4 weeks. This may explain the LDL cholesterol greater than 160. Unfortunately the pain in Tracy Price right shoulder continues to get worse. Tracy Price has tremendous pain with internal rotation against resistance. The pain is underneath Tracy Price shoulder blade and radiates into Tracy Price right breast. It is exacerbated by internal rotation and external rotation.  At that time my plan was: Tracy Price blood pressure is much better. I will have the patient return in 3 months to recheck a hemoglobin A1c, CMP and a fasting lipid panel. Tracy Price was clearly overmedicated. I believe the majority of Tracy Price symptoms are due to low blood pressure and dehydration. I will schedule the patient for an MRI of Tracy Price right shoulder as I believe Tracy Price has a partial tear in Tracy Price subscapularis muscle. This occurred more than one year ago when Tracy Price tried to catch Tracy Price mother who was falling. Tracy Price mother died last 11-21-22. The pain has been there ever since but is gradually worsened. Therefore I believe that time is of the essence to order an MRI because Tracy Price may be reaching a point where surgeons cannot help Tracy Price if there was a partial tear.  04/30/16 The patient is here today for follow-up. Since I last saw the patient, Tracy Price has gained 8 pounds due to the fact Tracy Price's been unable to exercise because of the heat. Tracy Price blood pressure today is well controlled. Tracy Price reports that Tracy Price fasting blood sugars between 110 and 130. However Tracy Price two-hour postprandial sugars are over 200 according to the patient. Tracy Price also continues to have pain in Tracy Price right shoulder even after receiving cortisone injection from the orthopedist. I recommended that Tracy Price call the orthopedist back to let them know because that likely would want to schedule an MRI as I recommended at the last visit but Tracy Price insurance  denied. Tracy Price denies any myalgias or right upper quadrant pain on Tracy Price cholesterol medication. Tracy Price is taking 1-2 tramadol every day. Tracy Price states the 60 tablet are lasting approximately 1-2 months. Tracy Price takes it for the pain in Tracy Price shoulders as well as also other joint pains. Tracy Price is unable to take NSAIDs because of Tracy Price chronic kidney disease. Tracy Price is unable to take metformin due to chronic kidney disease. Past Medical History:  Diagnosis Date  . Chest pain   . CKD (chronic kidney disease) stage 3, GFR 30-59 ml/min   . Diabetes mellitus without complication ([place])    type 2  . Hyperlipidemia   . Hypertension   . Sleep apnea 1995   had surgery to correct   Past Surgical History:  Procedure Laterality Date  . GASTRIC BYPASS  2005   Current Outpatient Prescriptions on File Prior to  Visit  Medication Sig Dispense Refill  . blood glucose meter kit and supplies Dispense based on patient and insurance preference. Use up to 3 times a day for e11.65 1 each 0  . glucose blood test strip Use as instructed 200 each 4  . GLYXAMBI 25-5 MG TABS TAKE 1 TABLET BY MOUTH DAILY. 30 tablet 6  . ibuprofen (ADVIL,MOTRIN) 200 MG tablet Take 400 mg by mouth at bedtime as needed for pain.    Marland Kitchen losartan (COZAAR) 100 MG tablet Take 1 tablet (100 mg total) by mouth daily. 90 tablet 3  . Melatonin 1 MG TABS Take by mouth at bedtime as needed.    Glory Rosebush DELICA LANCETS 97Q MISC Checks BS TID 200 each 4  . pravastatin (PRAVACHOL) 40 MG tablet TAKE 1 TABLET BY MOUTH ONCE A DAY 90 tablet 1  . traMADol (ULTRAM) 50 MG tablet TAKE 1 TABLET BY MOUTH EVERY 6 HOURS AS NEEDED FOR PAIN 180 tablet 0   No current facility-administered medications on file prior to visit.    No Known Allergies Social History   Social History  . Marital status: Single    Spouse name: N/A  . Number of children: N/A  . Years of education: N/A   Occupational History  . Not on file.   Social History Main Topics  . Smoking status: Never Smoker  .  Smokeless tobacco: Never Used  . Alcohol use No  . Drug use: No  . Sexual activity: Not on file   Other Topics Concern  . Not on file   Social History Narrative  . No narrative on file      Review of Systems  All other systems reviewed and are negative.      Objective:   Physical Exam  Constitutional: Tracy Price appears well-developed and well-nourished.  HENT:  Right Ear: External ear normal.  Left Ear: External ear normal.  Nose: Nose normal.  Mouth/Throat: Oropharynx is clear and moist.  Eyes: Conjunctivae are normal.  Neck: Neck supple. No thyromegaly present.  Cardiovascular: Normal rate, regular rhythm and normal heart sounds.  Exam reveals no gallop and no friction rub.   No murmur heard. Pulmonary/Chest: Effort normal and breath sounds normal. No respiratory distress. Tracy Price has no wheezes. Tracy Price has no rales.  Abdominal: Soft. Bowel sounds are normal. Tracy Price exhibits no distension and no mass. There is no tenderness. There is no rebound and no guarding.  Musculoskeletal: Tracy Price exhibits no edema.  Lymphadenopathy:    Tracy Price has no cervical adenopathy.  Skin: No rash noted.  Vitals reviewed.         Assessment & Plan:  Controlled type 2 diabetes mellitus without complication, without long-term current use of insulin ([place]) - Plan: COMPLETE METABOLIC PANEL WITH GFR, CBC with Differential/Platelet, Lipid panel, Hemoglobin A1c, Microalbumin, urine  Return fasting for a CMP, fasting lipid panel, hemoglobin A1c, and urine microalbumin. Tracy Price blood pressure is at goal. Tracy Price goal hemoglobin A1c is less than 6.5. Tracy Price goal LDL cholesterol is less than 100. I recommended the patient stay away from all NSAIDs. I recommended that Tracy Price drink plenty of fluids. I will continue to refill Tracy Price tramadol for 60 tablets every month. I recommended Tracy Price follow back up with orthopedist as a suspect that Tracy Price may have a partial tear in Tracy Price rotator cuff.

## 2016-05-01 MED ORDER — PRAVASTATIN SODIUM 40 MG PO TABS: 40.0000 mg | ORAL_TABLET | Freq: Every day | ORAL | 1 refills | Status: DC

## 2016-05-01 MED ORDER — LOSARTAN POTASSIUM 100 MG PO TABS: 100.0000 mg | ORAL_TABLET | Freq: Every day | ORAL | 3 refills | Status: DC

## 2016-05-01 MED ORDER — EMPAGLIFLOZIN-LINAGLIPTIN 25-5 MG PO TABS: 1.0000 | ORAL_TABLET | Freq: Every day | ORAL | 3 refills | Status: DC

## 2016-05-01 NOTE — Addendum Note (Signed)
Addended by: Shary Decamp B on: 05/01/2016 10:22 AM   Modules accepted: Orders

## 2016-05-02 ENCOUNTER — Encounter: Payer: Self-pay | Admitting: Family Medicine

## 2016-05-03 ENCOUNTER — Other Ambulatory Visit: Payer: BC Managed Care – PPO

## 2016-05-03 LAB — HEMOGLOBIN A1C
HEMOGLOBIN A1C: 6.5 % — AB (ref <5.7)
MEAN PLASMA GLUCOSE: 140 mg/dL

## 2016-05-03 LAB — CBC WITH DIFFERENTIAL/PLATELET
Basophils Absolute: 120 cells/uL (ref 0–200)
Basophils Relative: 2 %
EOS ABS: 1020 {cells}/uL — AB (ref 15–500)
EOS PCT: 17 %
HCT: 34.4 % — ABNORMAL LOW (ref 35.0–45.0)
Hemoglobin: 11.1 g/dL — ABNORMAL LOW (ref 12.0–15.0)
LYMPHS ABS: 1680 {cells}/uL (ref 850–3900)
Lymphocytes Relative: 28 %
MCH: 29.2 pg (ref 27.0–33.0)
MCHC: 32.3 g/dL (ref 32.0–36.0)
MCV: 90.5 fL (ref 80.0–100.0)
MONO ABS: 480 {cells}/uL (ref 200–950)
MONOS PCT: 8 %
MPV: 10.2 fL (ref 7.5–12.5)
NEUTROS PCT: 45 %
Neutro Abs: 2700 cells/uL (ref 1500–7800)
PLATELETS: 215 10*3/uL (ref 140–400)
RBC: 3.8 MIL/uL (ref 3.80–5.10)
RDW: 13.7 % (ref 11.0–15.0)
WBC: 6 10*3/uL (ref 3.8–10.8)

## 2016-05-03 LAB — LIPID PANEL
Cholesterol: 173 mg/dL (ref 125–200)
HDL: 57 mg/dL (ref >=46)
LDL Cholesterol: 100 mg/dL (ref <130)
Total CHOL/HDL Ratio: 3 Ratio (ref <=5.0)
Triglycerides: 82 mg/dL (ref <150)
VLDL: 16 mg/dL (ref <30)

## 2016-05-03 LAB — COMPLETE METABOLIC PANEL WITH GFR
ALT: 15 U/L (ref 6–29)
AST: 19 U/L (ref 10–35)
Albumin: 3.5 g/dL — ABNORMAL LOW (ref 3.6–5.1)
Alkaline Phosphatase: 84 U/L (ref 33–130)
BUN: 27 mg/dL — ABNORMAL HIGH (ref 7–25)
CALCIUM: 8.7 mg/dL (ref 8.6–10.4)
CHLORIDE: 103 mmol/L (ref 98–110)
CO2: 29 mmol/L (ref 20–31)
CREATININE: 1.62 mg/dL — AB (ref 0.50–1.05)
GFR, EST AFRICAN AMERICAN: 40 mL/min — AB (ref >=60)
GFR, Est Non African American: 34 mL/min — ABNORMAL LOW (ref >=60)
Glucose, Bld: 86 mg/dL (ref 70–99)
POTASSIUM: 4.3 mmol/L (ref 3.5–5.3)
Sodium: 140 mmol/L (ref 135–146)
Total Bilirubin: 0.3 mg/dL (ref 0.2–1.2)
Total Protein: 6.2 g/dL (ref 6.1–8.1)

## 2016-05-08 ENCOUNTER — Other Ambulatory Visit: Payer: Self-pay | Admitting: Family Medicine

## 2016-06-16 ENCOUNTER — Encounter: Payer: Self-pay | Admitting: Family Medicine

## 2016-06-24 ENCOUNTER — Other Ambulatory Visit: Payer: Self-pay | Admitting: Family Medicine

## 2016-06-26 NOTE — Telephone Encounter (Signed)
Ok to refill 

## 2016-06-27 NOTE — Telephone Encounter (Signed)
ok 

## 2016-06-27 NOTE — Telephone Encounter (Signed)
Medication called to pharmacy. 

## 2016-07-09 ENCOUNTER — Encounter: Payer: Self-pay | Admitting: Family Medicine

## 2016-07-17 ENCOUNTER — Encounter: Payer: Self-pay | Admitting: Family Medicine

## 2016-07-18 ENCOUNTER — Encounter: Payer: Self-pay | Admitting: Family Medicine

## 2016-07-18 MED ORDER — ATORVASTATIN CALCIUM 20 MG PO TABS: 20.0000 mg | ORAL_TABLET | Freq: Every day | ORAL | 3 refills | Status: DC

## 2016-08-04 ENCOUNTER — Other Ambulatory Visit: Payer: Self-pay | Admitting: Family Medicine

## 2016-08-05 NOTE — Telephone Encounter (Signed)
Ok to refill??  Last office visit 04/30/2016.  Last refill 06/27/2016.

## 2016-08-05 NOTE — Telephone Encounter (Signed)
rx called in

## 2016-08-05 NOTE — Telephone Encounter (Signed)
ok 

## 2016-09-18 ENCOUNTER — Other Ambulatory Visit: Payer: Self-pay | Admitting: Family Medicine

## 2016-09-18 NOTE — Telephone Encounter (Signed)
Ok to refill 

## 2016-09-19 NOTE — Telephone Encounter (Signed)
Medication called to pharmacy. 

## 2016-09-19 NOTE — Telephone Encounter (Signed)
ok 

## 2016-09-21 ENCOUNTER — Other Ambulatory Visit: Payer: Self-pay | Admitting: Family Medicine

## 2016-09-23 NOTE — Telephone Encounter (Signed)
ok 

## 2016-09-23 NOTE — Telephone Encounter (Signed)
Medication called to pharmacy. 

## 2016-09-23 NOTE — Telephone Encounter (Signed)
Ok to refill 

## 2016-10-28 ENCOUNTER — Other Ambulatory Visit: Payer: Self-pay | Admitting: Family Medicine

## 2016-10-29 MED ORDER — TRAMADOL HCL 50 MG PO TABS: 50.0000 mg | ORAL_TABLET | Freq: Four times a day (QID) | ORAL | 0 refills | Status: DC | PRN

## 2016-12-19 ENCOUNTER — Other Ambulatory Visit: Payer: Self-pay | Admitting: Family Medicine

## 2016-12-19 NOTE — Telephone Encounter (Signed)
Med called in

## 2016-12-19 NOTE — Telephone Encounter (Signed)
ok 

## 2016-12-19 NOTE — Telephone Encounter (Signed)
Ok to refill 

## 2017-01-30 ENCOUNTER — Other Ambulatory Visit: Payer: Self-pay | Admitting: *Deleted

## 2017-01-30 MED ORDER — EMPAGLIFLOZIN-LINAGLIPTIN 25-5 MG PO TABS: 1.0000 | ORAL_TABLET | Freq: Every day | ORAL | 3 refills | Status: DC

## 2017-02-04 ENCOUNTER — Other Ambulatory Visit: Payer: Self-pay | Admitting: Family Medicine

## 2017-02-04 NOTE — Telephone Encounter (Signed)
Ok to refill 

## 2017-02-04 NOTE — Telephone Encounter (Signed)
ok 

## 2017-02-04 NOTE — Telephone Encounter (Signed)
Medication called to pharmacy. 

## 2017-02-06 ENCOUNTER — Telehealth: Payer: Self-pay | Admitting: *Deleted

## 2017-02-06 NOTE — Telephone Encounter (Signed)
Received request from pharmacy for [place] on Glyxambi.   PA submitted.   Dx: E11.65- DM.

## 2017-02-06 NOTE — Telephone Encounter (Signed)
Your information has been submitted to [employer]. To check for an updated outcome later, reopen this PA request from your dashboard. If [employer] has not responded to your request within 24 hours, contact [employer] at [phone]. If you think there may be a problem with your PA request, use our live chat feature at the bottom right.    

## 2017-02-06 NOTE — Telephone Encounter (Signed)
Please wait for [employer] to return a determination.

## 2017-02-07 MED ORDER — EMPAGLIFLOZIN-LINAGLIPTIN 25-5 MG PO TABS: 1.0000 | ORAL_TABLET | Freq: Every day | ORAL | 3 refills | Status: DC

## 2017-02-07 NOTE — Telephone Encounter (Signed)
Received PA determination.   PA approved 02/06/2017- 02/06/2018.  Pharmacy made aware.

## 2017-02-08 ENCOUNTER — Encounter: Payer: Self-pay | Admitting: Family Medicine

## 2017-02-17 ENCOUNTER — Encounter: Payer: Self-pay | Admitting: Family Medicine

## 2017-02-17 MED ORDER — AMOXICILLIN 875 MG PO TABS: 875.0000 mg | ORAL_TABLET | Freq: Two times a day (BID) | ORAL | 0 refills | Status: DC

## 2017-03-05 ENCOUNTER — Encounter (HOSPITAL_COMMUNITY)
Admission: EM | Disposition: A | Discharge status: Rehabilitation Facility | Payer: Self-pay | Source: Home / Self Care | Attending: Neurosurgery

## 2017-03-05 ENCOUNTER — Inpatient Hospital Stay (HOSPITAL_COMMUNITY)
Admission: EM | Admit: 2017-03-05 | Discharge: 2017-03-10 | DRG: 026 | Disposition: A | Discharge status: Rehabilitation Facility | Payer: BC Managed Care – PPO | Attending: Neurosurgery | Admitting: Neurosurgery

## 2017-03-05 ENCOUNTER — Emergency Department (HOSPITAL_COMMUNITY): Payer: BC Managed Care – PPO | Admitting: Anesthesiology

## 2017-03-05 ENCOUNTER — Encounter (HOSPITAL_COMMUNITY): Payer: Self-pay | Admitting: Emergency Medicine

## 2017-03-05 ENCOUNTER — Emergency Department (HOSPITAL_COMMUNITY): Payer: BC Managed Care – PPO

## 2017-03-05 DIAGNOSIS — Z79899 Other long term (current) drug therapy: Secondary | ICD-10-CM

## 2017-03-05 DIAGNOSIS — I129 Hypertensive chronic kidney disease with stage 1 through stage 4 chronic kidney disease, or unspecified chronic kidney disease: Secondary | ICD-10-CM | POA: Diagnosis present

## 2017-03-05 DIAGNOSIS — M25511 Pain in right shoulder: Secondary | ICD-10-CM | POA: Diagnosis not present

## 2017-03-05 DIAGNOSIS — N183 Chronic kidney disease, stage 3 unspecified: Secondary | ICD-10-CM

## 2017-03-05 DIAGNOSIS — D62 Acute posthemorrhagic anemia: Secondary | ICD-10-CM | POA: Diagnosis not present

## 2017-03-05 DIAGNOSIS — Z833 Family history of diabetes mellitus: Secondary | ICD-10-CM | POA: Diagnosis not present

## 2017-03-05 DIAGNOSIS — D72829 Elevated white blood cell count, unspecified: Secondary | ICD-10-CM

## 2017-03-05 DIAGNOSIS — Z9884 Bariatric surgery status: Secondary | ICD-10-CM

## 2017-03-05 DIAGNOSIS — E119 Type 2 diabetes mellitus without complications: Secondary | ICD-10-CM

## 2017-03-05 DIAGNOSIS — Z8249 Family history of ischemic heart disease and other diseases of the circulatory system: Secondary | ICD-10-CM

## 2017-03-05 DIAGNOSIS — S06303S Unspecified focal traumatic brain injury with loss of consciousness of 1 hour to 5 hours 59 minutes, sequela: Secondary | ICD-10-CM | POA: Diagnosis not present

## 2017-03-05 DIAGNOSIS — G441 Vascular headache, not elsewhere classified: Secondary | ICD-10-CM | POA: Diagnosis not present

## 2017-03-05 DIAGNOSIS — E1122 Type 2 diabetes mellitus with diabetic chronic kidney disease: Secondary | ICD-10-CM | POA: Diagnosis present

## 2017-03-05 DIAGNOSIS — I671 Cerebral aneurysm, nonruptured: Secondary | ICD-10-CM | POA: Diagnosis not present

## 2017-03-05 DIAGNOSIS — I10 Essential (primary) hypertension: Secondary | ICD-10-CM

## 2017-03-05 DIAGNOSIS — S065X0S Traumatic subdural hemorrhage without loss of consciousness, sequela: Secondary | ICD-10-CM | POA: Diagnosis not present

## 2017-03-05 DIAGNOSIS — E785 Hyperlipidemia, unspecified: Secondary | ICD-10-CM | POA: Diagnosis present

## 2017-03-05 DIAGNOSIS — Z2989 Encounter for other specified prophylactic measures: Secondary | ICD-10-CM

## 2017-03-05 DIAGNOSIS — S065XAA Traumatic subdural hemorrhage with loss of consciousness status unknown, initial encounter: Secondary | ICD-10-CM | POA: Diagnosis present

## 2017-03-05 DIAGNOSIS — F329 Major depressive disorder, single episode, unspecified: Secondary | ICD-10-CM | POA: Diagnosis not present

## 2017-03-05 DIAGNOSIS — Z298 Encounter for other specified prophylactic measures: Secondary | ICD-10-CM

## 2017-03-05 DIAGNOSIS — G4733 Obstructive sleep apnea (adult) (pediatric): Secondary | ICD-10-CM

## 2017-03-05 DIAGNOSIS — R4189 Other symptoms and signs involving cognitive functions and awareness: Secondary | ICD-10-CM | POA: Diagnosis not present

## 2017-03-05 DIAGNOSIS — S065X9A Traumatic subdural hemorrhage with loss of consciousness of unspecified duration, initial encounter: Secondary | ICD-10-CM

## 2017-03-05 DIAGNOSIS — Z7982 Long term (current) use of aspirin: Secondary | ICD-10-CM | POA: Diagnosis not present

## 2017-03-05 DIAGNOSIS — R51 Headache: Secondary | ICD-10-CM | POA: Diagnosis present

## 2017-03-05 DIAGNOSIS — E669 Obesity, unspecified: Secondary | ICD-10-CM | POA: Diagnosis not present

## 2017-03-05 DIAGNOSIS — S098XXA Other specified injuries of head, initial encounter: Secondary | ICD-10-CM | POA: Diagnosis not present

## 2017-03-05 DIAGNOSIS — N390 Urinary tract infection, site not specified: Secondary | ICD-10-CM | POA: Diagnosis not present

## 2017-03-05 DIAGNOSIS — W19XXXA Unspecified fall, initial encounter: Secondary | ICD-10-CM | POA: Diagnosis present

## 2017-03-05 DIAGNOSIS — R270 Ataxia, unspecified: Secondary | ICD-10-CM

## 2017-03-05 DIAGNOSIS — R4701 Aphasia: Secondary | ICD-10-CM | POA: Diagnosis present

## 2017-03-05 DIAGNOSIS — R4182 Altered mental status, unspecified: Secondary | ICD-10-CM | POA: Diagnosis present

## 2017-03-05 DIAGNOSIS — R29818 Other symptoms and signs involving the nervous system: Secondary | ICD-10-CM | POA: Diagnosis not present

## 2017-03-05 DIAGNOSIS — I6200 Nontraumatic subdural hemorrhage, unspecified: Secondary | ICD-10-CM | POA: Diagnosis not present

## 2017-03-05 DIAGNOSIS — K5900 Constipation, unspecified: Secondary | ICD-10-CM | POA: Diagnosis not present

## 2017-03-05 DIAGNOSIS — E1169 Type 2 diabetes mellitus with other specified complication: Secondary | ICD-10-CM | POA: Diagnosis not present

## 2017-03-05 DIAGNOSIS — G8929 Other chronic pain: Secondary | ICD-10-CM | POA: Diagnosis not present

## 2017-03-05 DIAGNOSIS — Z7984 Long term (current) use of oral hypoglycemic drugs: Secondary | ICD-10-CM

## 2017-03-05 DIAGNOSIS — E46 Unspecified protein-calorie malnutrition: Secondary | ICD-10-CM | POA: Diagnosis not present

## 2017-03-05 HISTORY — PX: CRANIOTOMY: SHX93

## 2017-03-05 LAB — URINALYSIS, ROUTINE W REFLEX MICROSCOPIC
BILIRUBIN URINE: NEGATIVE
GLUCOSE, UA: 500 mg/dL — AB
KETONES UR: 40 mg/dL — AB
LEUKOCYTES UA: NEGATIVE
Nitrite: NEGATIVE
PROTEIN: NEGATIVE mg/dL
Specific Gravity, Urine: >1.03 — ABNORMAL HIGH (ref 1.005–1.030)
pH: 5 (ref 5.0–8.0)

## 2017-03-05 LAB — COMPREHENSIVE METABOLIC PANEL
ALT: 12 U/L — ABNORMAL LOW (ref 14–54)
AST: 15 U/L (ref 15–41)
Albumin: 3.9 g/dL (ref 3.5–5.0)
Alkaline Phosphatase: 178 U/L — ABNORMAL HIGH (ref 38–126)
Anion gap: 17 — ABNORMAL HIGH (ref 5–15)
BILIRUBIN TOTAL: 1 mg/dL (ref 0.3–1.2)
BUN: 30 mg/dL — AB (ref 6–20)
CO2: 17 mmol/L — ABNORMAL LOW (ref 22–32)
Calcium: 9.4 mg/dL (ref 8.9–10.3)
Chloride: 102 mmol/L (ref 101–111)
Creatinine, Ser: 1.89 mg/dL — ABNORMAL HIGH (ref 0.44–1.00)
GFR calc Af Amer: 32 mL/min — ABNORMAL LOW (ref >60)
GFR, EST NON AFRICAN AMERICAN: 28 mL/min — AB (ref >60)
Glucose, Bld: 178 mg/dL — ABNORMAL HIGH (ref 65–99)
POTASSIUM: 3.7 mmol/L (ref 3.5–5.1)
Sodium: 136 mmol/L (ref 135–145)
TOTAL PROTEIN: 8.6 g/dL — AB (ref 6.5–8.1)

## 2017-03-05 LAB — DIFFERENTIAL
BASOS ABS: 0 10*3/uL (ref 0.0–0.1)
Basophils Relative: 0 %
EOS ABS: 0.2 10*3/uL (ref 0.0–0.7)
EOS PCT: 2 %
LYMPHS ABS: 1.8 10*3/uL (ref 0.7–4.0)
Lymphocytes Relative: 13 %
MONOS PCT: 5 %
Monocytes Absolute: 0.7 10*3/uL (ref 0.1–1.0)
Neutro Abs: 11.1 10*3/uL — ABNORMAL HIGH (ref 1.7–7.7)
Neutrophils Relative %: 80 %

## 2017-03-05 LAB — CBC
HCT: 34.4 % — ABNORMAL LOW (ref 36.0–46.0)
HEMATOCRIT: 42.1 % (ref 36.0–46.0)
HEMOGLOBIN: 13.9 g/dL (ref 12.0–15.0)
HEMOGLOBIN: 11.2 g/dL — AB (ref 12.0–15.0)
MCH: 29.2 pg (ref 26.0–34.0)
MCH: 28.6 pg (ref 26.0–34.0)
MCHC: 33 g/dL (ref 30.0–36.0)
MCHC: 32.6 g/dL (ref 30.0–36.0)
MCV: 88.4 fL (ref 78.0–100.0)
MCV: 88 fL (ref 78.0–100.0)
Platelets: 449 10*3/uL — ABNORMAL HIGH (ref 150–400)
Platelets: 353 10*3/uL (ref 150–400)
RBC: 4.76 MIL/uL (ref 3.87–5.11)
RBC: 3.91 MIL/uL (ref 3.87–5.11)
RDW: 12.8 % (ref 11.5–15.5)
RDW: 12.7 % (ref 11.5–15.5)
WBC: 13.9 10*3/uL — ABNORMAL HIGH (ref 4.0–10.5)
WBC: 12.2 10*3/uL — ABNORMAL HIGH (ref 4.0–10.5)

## 2017-03-05 LAB — GLUCOSE, CAPILLARY
GLUCOSE-CAPILLARY: 121 mg/dL — AB (ref 65–99)
Glucose-Capillary: 112 mg/dL — ABNORMAL HIGH (ref 65–99)

## 2017-03-05 LAB — URINALYSIS, MICROSCOPIC (REFLEX): Squamous Epithelial / LPF: NONE SEEN

## 2017-03-05 LAB — BASIC METABOLIC PANEL
ANION GAP: 11 (ref 5–15)
BUN: 28 mg/dL — ABNORMAL HIGH (ref 6–20)
CHLORIDE: 107 mmol/L (ref 101–111)
CO2: 16 mmol/L — AB (ref 22–32)
Calcium: 8.3 mg/dL — ABNORMAL LOW (ref 8.9–10.3)
Creatinine, Ser: 1.51 mg/dL — ABNORMAL HIGH (ref 0.44–1.00)
GFR calc non Af Amer: 36 mL/min — ABNORMAL LOW (ref >60)
GFR, EST AFRICAN AMERICAN: 42 mL/min — AB (ref >60)
Glucose, Bld: 162 mg/dL — ABNORMAL HIGH (ref 65–99)
Potassium: 4 mmol/L (ref 3.5–5.1)
Sodium: 134 mmol/L — ABNORMAL LOW (ref 135–145)

## 2017-03-05 LAB — I-STAT CHEM 8, ED
BUN: 38 mg/dL — ABNORMAL HIGH (ref 6–20)
CALCIUM ION: 1.14 mmol/L — AB (ref 1.15–1.40)
Chloride: 103 mmol/L (ref 101–111)
Creatinine, Ser: 1.8 mg/dL — ABNORMAL HIGH (ref 0.44–1.00)
GLUCOSE: 181 mg/dL — AB (ref 65–99)
HCT: 44 % (ref 36.0–46.0)
HEMOGLOBIN: 15 g/dL (ref 12.0–15.0)
Potassium: 3.9 mmol/L (ref 3.5–5.1)
Sodium: 138 mmol/L (ref 135–145)
TCO2: 20 mmol/L (ref 0–100)

## 2017-03-05 LAB — PROTIME-INR
INR: 1.15
Prothrombin Time: 14.7 seconds (ref 11.4–15.2)

## 2017-03-05 LAB — I-STAT TROPONIN, ED: TROPONIN I, POC: 0.04 ng/mL (ref 0.00–0.08)

## 2017-03-05 LAB — APTT: aPTT: 30 seconds (ref 24–36)

## 2017-03-05 LAB — CBG MONITORING, ED: Glucose-Capillary: 167 mg/dL — ABNORMAL HIGH (ref 65–99)

## 2017-03-05 LAB — MRSA PCR SCREENING: MRSA by PCR: NEGATIVE

## 2017-03-05 SURGERY — CRANIOTOMY HEMATOMA EVACUATION SUBDURAL
Anesthesia: General | Laterality: Left

## 2017-03-05 MED ORDER — PROPOFOL 10 MG/ML IV BOLUS
INTRAVENOUS | Status: AC
  Filled 2017-03-05: qty 20

## 2017-03-05 MED ORDER — SUCCINYLCHOLINE CHLORIDE 200 MG/10ML IV SOSY
PREFILLED_SYRINGE | INTRAVENOUS | Status: DC | PRN
  Administered 2017-03-05: 60 mg via INTRAVENOUS

## 2017-03-05 MED ORDER — ONDANSETRON HCL 4 MG/2ML IJ SOLN
INTRAMUSCULAR | Status: DC | PRN
  Administered 2017-03-05: 4 mg via INTRAVENOUS

## 2017-03-05 MED ORDER — PROMETHAZINE HCL 25 MG/ML IJ SOLN
6.2500 mg | INTRAMUSCULAR | Status: DC | PRN
  Administered 2017-03-05: 6.25 mg via INTRAVENOUS

## 2017-03-05 MED ORDER — HYDROMORPHONE HCL 1 MG/ML IJ SOLN
INTRAMUSCULAR | Status: AC
  Filled 2017-03-05: qty 0.5

## 2017-03-05 MED ORDER — ONDANSETRON HCL 4 MG/2ML IJ SOLN: 4.0000 mg | INTRAMUSCULAR | Status: DC | PRN

## 2017-03-05 MED ORDER — LIDOCAINE-EPINEPHRINE 1 %-1:100000 IJ SOLN
INTRAMUSCULAR | Status: AC
  Filled 2017-03-05: qty 1

## 2017-03-05 MED ORDER — 0.9 % SODIUM CHLORIDE (POUR BTL) OPTIME
TOPICAL | Status: DC | PRN
Start: 2017-03-05 — End: 2017-03-05
  Administered 2017-03-05 (×2): 1000 mL

## 2017-03-05 MED ORDER — INSULIN ASPART 100 UNIT/ML ~~LOC~~ SOLN
0.0000 [IU] | Freq: Three times a day (TID) | SUBCUTANEOUS | Status: DC
  Administered 2017-03-06: 3 [IU] via SUBCUTANEOUS
  Administered 2017-03-07 – 2017-03-08 (×2): 4 [IU] via SUBCUTANEOUS

## 2017-03-05 MED ORDER — SUCCINYLCHOLINE CHLORIDE 200 MG/10ML IV SOSY
PREFILLED_SYRINGE | INTRAVENOUS | Status: AC
  Filled 2017-03-05: qty 10

## 2017-03-05 MED ORDER — HYDROMORPHONE HCL 1 MG/ML IJ SOLN
0.5000 mg | INTRAMUSCULAR | Status: DC | PRN
  Administered 2017-03-05 – 2017-03-08 (×13): 0.5 mg via INTRAVENOUS
  Filled 2017-03-05 (×13): qty 0.5

## 2017-03-05 MED ORDER — PROMETHAZINE HCL 25 MG PO TABS: 12.5000 mg | ORAL_TABLET | ORAL | Status: DC | PRN

## 2017-03-05 MED ORDER — IOPAMIDOL (ISOVUE-370) INJECTION 76%
INTRAVENOUS | Status: AC
  Administered 2017-03-05: 50 mL
  Filled 2017-03-05: qty 50

## 2017-03-05 MED ORDER — SODIUM CHLORIDE 0.9 % IV BOLUS (SEPSIS)
1000.0000 mL | Freq: Once | INTRAVENOUS | Status: AC
  Administered 2017-03-05: 1000 mL via INTRAVENOUS

## 2017-03-05 MED ORDER — CEFAZOLIN SODIUM 1 G IJ SOLR
INTRAMUSCULAR | Status: AC
Start: 2017-03-05 — End: 2017-03-05
  Filled 2017-03-05: qty 20

## 2017-03-05 MED ORDER — ONDANSETRON HCL 4 MG/2ML IJ SOLN
INTRAMUSCULAR | Status: AC
  Filled 2017-03-05: qty 2

## 2017-03-05 MED ORDER — PROPOFOL 10 MG/ML IV BOLUS
INTRAVENOUS | Status: DC | PRN
  Administered 2017-03-05: 160 mg via INTRAVENOUS

## 2017-03-05 MED ORDER — TRAMADOL HCL 50 MG PO TABS
50.0000 mg | ORAL_TABLET | Freq: Four times a day (QID) | ORAL | Status: DC | PRN
  Administered 2017-03-05 – 2017-03-10 (×9): 50 mg via ORAL
  Filled 2017-03-05 (×10): qty 1

## 2017-03-05 MED ORDER — POTASSIUM CHLORIDE IN NACL 20-0.9 MEQ/L-% IV SOLN
INTRAVENOUS | Status: DC
  Administered 2017-03-05: 18:00:00 via INTRAVENOUS
  Filled 2017-03-05 (×2): qty 1000

## 2017-03-05 MED ORDER — SODIUM CHLORIDE 0.9 % IV SOLN
500.0000 mg | Freq: Two times a day (BID) | INTRAVENOUS | Status: DC
  Administered 2017-03-05 – 2017-03-08 (×6): 500 mg via INTRAVENOUS
  Filled 2017-03-05 (×7): qty 5

## 2017-03-05 MED ORDER — LIDOCAINE 2% (20 MG/ML) 5 ML SYRINGE
INTRAMUSCULAR | Status: DC | PRN
  Administered 2017-03-05: 100 mg via INTRAVENOUS

## 2017-03-05 MED ORDER — SODIUM CHLORIDE 0.9 % IV SOLN
INTRAVENOUS | Status: DC | PRN
  Administered 2017-03-05: 15:00:00 via INTRAVENOUS

## 2017-03-05 MED ORDER — PANTOPRAZOLE SODIUM 40 MG IV SOLR
40.0000 mg | Freq: Every day | INTRAVENOUS | Status: DC
  Administered 2017-03-05 – 2017-03-06 (×2): 40 mg via INTRAVENOUS
  Filled 2017-03-05 (×2): qty 40

## 2017-03-05 MED ORDER — LABETALOL HCL 5 MG/ML IV SOLN
10.0000 mg | INTRAVENOUS | Status: DC | PRN
  Filled 2017-03-05: qty 4

## 2017-03-05 MED ORDER — SERTRALINE HCL 100 MG PO TABS
100.0000 mg | ORAL_TABLET | Freq: Every day | ORAL | Status: DC
  Administered 2017-03-06 – 2017-03-10 (×5): 100 mg via ORAL
  Filled 2017-03-05: qty 2
  Filled 2017-03-05: qty 1
  Filled 2017-03-05: qty 2
  Filled 2017-03-05: qty 1
  Filled 2017-03-05: qty 2

## 2017-03-05 MED ORDER — CEFAZOLIN SODIUM-DEXTROSE 2-4 GM/100ML-% IV SOLN
2.0000 g | Freq: Three times a day (TID) | INTRAVENOUS | Status: AC
  Administered 2017-03-05 – 2017-03-06 (×2): 2 g via INTRAVENOUS
  Filled 2017-03-05 (×2): qty 100

## 2017-03-05 MED ORDER — CEFAZOLIN SODIUM 1 G IJ SOLR
INTRAMUSCULAR | Status: DC | PRN
  Administered 2017-03-05: 2 g via INTRAMUSCULAR

## 2017-03-05 MED ORDER — SODIUM CHLORIDE 0.9 % IR SOLN
Status: DC | PRN
  Administered 2017-03-05: 15:00:00

## 2017-03-05 MED ORDER — THROMBIN 20000 UNITS EX SOLR
CUTANEOUS | Status: DC | PRN
  Administered 2017-03-05: 16:00:00 via TOPICAL

## 2017-03-05 MED ORDER — LOSARTAN POTASSIUM 50 MG PO TABS
100.0000 mg | ORAL_TABLET | Freq: Every day | ORAL | Status: DC
  Administered 2017-03-06 – 2017-03-10 (×5): 100 mg via ORAL
  Filled 2017-03-05 (×5): qty 2

## 2017-03-05 MED ORDER — SUGAMMADEX SODIUM 200 MG/2ML IV SOLN
INTRAVENOUS | Status: DC | PRN
  Administered 2017-03-05: 300 mg via INTRAVENOUS

## 2017-03-05 MED ORDER — EMPAGLIFLOZIN-LINAGLIPTIN 25-5 MG PO TABS: 1.0000 | ORAL_TABLET | Freq: Every day | ORAL | Status: DC

## 2017-03-05 MED ORDER — AMOXICILLIN 875 MG PO TABS: 875.0000 mg | ORAL_TABLET | Freq: Two times a day (BID) | ORAL | Status: DC

## 2017-03-05 MED ORDER — FENTANYL CITRATE (PF) 100 MCG/2ML IJ SOLN
INTRAMUSCULAR | Status: DC | PRN
  Administered 2017-03-05 (×5): 50 ug via INTRAVENOUS

## 2017-03-05 MED ORDER — MIDAZOLAM HCL 2 MG/2ML IJ SOLN
INTRAMUSCULAR | Status: AC
  Filled 2017-03-05: qty 2

## 2017-03-05 MED ORDER — LIDOCAINE 2% (20 MG/ML) 5 ML SYRINGE
INTRAMUSCULAR | Status: AC
  Filled 2017-03-05: qty 5

## 2017-03-05 MED ORDER — PHENYLEPHRINE HCL 10 MG/ML IJ SOLN
INTRAVENOUS | Status: DC | PRN
  Administered 2017-03-05: 20 ug/min via INTRAVENOUS

## 2017-03-05 MED ORDER — ROCURONIUM BROMIDE 10 MG/ML (PF) SYRINGE
PREFILLED_SYRINGE | INTRAVENOUS | Status: AC
  Filled 2017-03-05: qty 5

## 2017-03-05 MED ORDER — FENTANYL CITRATE (PF) 250 MCG/5ML IJ SOLN
INTRAMUSCULAR | Status: AC
  Filled 2017-03-05: qty 5

## 2017-03-05 MED ORDER — THROMBIN 20000 UNITS EX SOLR
CUTANEOUS | Status: AC
  Filled 2017-03-05: qty 20000

## 2017-03-05 MED ORDER — HYDROMORPHONE HCL 1 MG/ML IJ SOLN
0.2500 mg | INTRAMUSCULAR | Status: DC | PRN
  Administered 2017-03-05: 0.5 mg via INTRAVENOUS

## 2017-03-05 MED ORDER — PROMETHAZINE HCL 25 MG/ML IJ SOLN
INTRAMUSCULAR | Status: AC
  Filled 2017-03-05: qty 1

## 2017-03-05 MED ORDER — THROMBIN 5000 UNITS EX SOLR
CUTANEOUS | Status: AC
  Filled 2017-03-05: qty 5000

## 2017-03-05 MED ORDER — VANCOMYCIN HCL 1000 MG IV SOLR
INTRAVENOUS | Status: AC
  Filled 2017-03-05: qty 1000

## 2017-03-05 MED ORDER — ROCURONIUM BROMIDE 10 MG/ML (PF) SYRINGE
PREFILLED_SYRINGE | INTRAVENOUS | Status: DC | PRN
  Administered 2017-03-05: 50 mg via INTRAVENOUS

## 2017-03-05 MED ORDER — LIDOCAINE-EPINEPHRINE 1 %-1:100000 IJ SOLN
INTRAMUSCULAR | Status: DC | PRN
  Administered 2017-03-05: 8 mL

## 2017-03-05 MED ORDER — DOCUSATE SODIUM 100 MG PO CAPS
100.0000 mg | ORAL_CAPSULE | Freq: Two times a day (BID) | ORAL | Status: DC
  Administered 2017-03-05 – 2017-03-10 (×10): 100 mg via ORAL
  Filled 2017-03-05 (×10): qty 1

## 2017-03-05 MED ORDER — LINAGLIPTIN 5 MG PO TABS
5.0000 mg | ORAL_TABLET | Freq: Every day | ORAL | Status: DC
  Administered 2017-03-06 – 2017-03-10 (×5): 5 mg via ORAL
  Filled 2017-03-05 (×5): qty 1

## 2017-03-05 MED ORDER — ALPRAZOLAM 0.5 MG PO TABS
0.5000 mg | ORAL_TABLET | Freq: Four times a day (QID) | ORAL | Status: DC | PRN
  Administered 2017-03-05 – 2017-03-06 (×2): 0.5 mg via ORAL
  Filled 2017-03-05 (×2): qty 1

## 2017-03-05 MED ORDER — ONDANSETRON HCL 4 MG PO TABS: 4.0000 mg | ORAL_TABLET | ORAL | Status: DC | PRN

## 2017-03-05 MED ORDER — SUGAMMADEX SODIUM 500 MG/5ML IV SOLN
INTRAVENOUS | Status: AC
  Filled 2017-03-05: qty 5

## 2017-03-05 MED ORDER — ATORVASTATIN CALCIUM 10 MG PO TABS
20.0000 mg | ORAL_TABLET | Freq: Every day | ORAL | Status: DC
  Filled 2017-03-05: qty 2
  Filled 2017-03-05: qty 1
  Filled 2017-03-05: qty 2
  Filled 2017-03-05: qty 1

## 2017-03-05 SURGICAL SUPPLY — 70 items
ADH SKN CLS APL DERMABOND .7 (GAUZE/BANDAGES/DRESSINGS)
BAG DECANTER FOR FLEXI CONT (MISCELLANEOUS) ×2 IMPLANT
BANDAGE GAUZE 4  KLING STR (GAUZE/BANDAGES/DRESSINGS) IMPLANT
BIT DRILL WIRE PASS 1.3MM (BIT) ×1 IMPLANT
BLADE CLIPPER SURG (BLADE) ×1 IMPLANT
BLADE SURG 11 STRL SS (BLADE) ×2 IMPLANT
BNDG COHESIVE 4X5 TAN NS LF (GAUZE/BANDAGES/DRESSINGS) IMPLANT
BUR ACORN 9.0 PRECISION (BURR) ×2 IMPLANT
BUR SPIRAL ROUTER 2.3 (BUR) IMPLANT
CANISTER SUCT 3000ML PPV (MISCELLANEOUS) ×2 IMPLANT
CARTRIDGE OIL MAESTRO DRILL (MISCELLANEOUS) ×1 IMPLANT
CATH ROBINSON RED A/P 10FR (CATHETERS) ×1 IMPLANT
CLIP TI MEDIUM 6 (CLIP) IMPLANT
COVER BACK TABLE 60X90IN (DRAPES) ×2 IMPLANT
DECANTER SPIKE VIAL GLASS SM (MISCELLANEOUS) ×1 IMPLANT
DERMABOND ADVANCED (GAUZE/BANDAGES/DRESSINGS)
DERMABOND ADVANCED .7 DNX12 (GAUZE/BANDAGES/DRESSINGS) ×1 IMPLANT
DIFFUSER DRILL AIR PNEUMATIC (MISCELLANEOUS) ×2 IMPLANT
DRAPE NEUROLOGICAL W/INCISE (DRAPES) ×2 IMPLANT
DRAPE SURG 17X23 STRL (DRAPES) IMPLANT
DRAPE WARM FLUID 44X44 (DRAPE) ×2 IMPLANT
DRILL WIRE PASS 1.3MM (BIT)
DRSG OPSITE 4X5.5 SM (GAUZE/BANDAGES/DRESSINGS) ×1 IMPLANT
DRSG OPSITE POSTOP 3X4 (GAUZE/BANDAGES/DRESSINGS) ×2 IMPLANT
ELECT CAUTERY BLADE 6.4 (BLADE) ×2 IMPLANT
ELECT REM PT RETURN 9FT ADLT (ELECTROSURGICAL) ×2
ELECTRODE REM PT RTRN 9FT ADLT (ELECTROSURGICAL) ×1 IMPLANT
GAUZE SPONGE 4X4 12PLY STRL (GAUZE/BANDAGES/DRESSINGS) IMPLANT
GAUZE SPONGE 4X4 16PLY XRAY LF (GAUZE/BANDAGES/DRESSINGS) IMPLANT
GLOVE BIO SURGEON STRL SZ7 (GLOVE) IMPLANT
GLOVE BIO SURGEON STRL SZ8 (GLOVE) ×2 IMPLANT
GLOVE BIOGEL PI IND STRL 7.0 (GLOVE) IMPLANT
GLOVE BIOGEL PI INDICATOR 7.0 (GLOVE)
GLOVE EXAM NITRILE LRG STRL (GLOVE) IMPLANT
GLOVE EXAM NITRILE XL STR (GLOVE) IMPLANT
GLOVE EXAM NITRILE XS STR PU (GLOVE) IMPLANT
GLOVE INDICATOR 8.5 STRL (GLOVE) ×2 IMPLANT
GOWN STRL REUS W/ TWL LRG LVL3 (GOWN DISPOSABLE) ×1 IMPLANT
GOWN STRL REUS W/ TWL XL LVL3 (GOWN DISPOSABLE) ×1 IMPLANT
GOWN STRL REUS W/TWL 2XL LVL3 (GOWN DISPOSABLE) ×2 IMPLANT
GOWN STRL REUS W/TWL LRG LVL3 (GOWN DISPOSABLE) ×2
GOWN STRL REUS W/TWL XL LVL3 (GOWN DISPOSABLE) ×2
HEMOSTAT SURGICEL 2X14 (HEMOSTASIS) IMPLANT
KIT BASIN OR (CUSTOM PROCEDURE TRAY) ×2 IMPLANT
KIT ROOM TURNOVER OR (KITS) ×2 IMPLANT
MARKER SKIN DUAL TIP RULER LAB (MISCELLANEOUS) ×1 IMPLANT
NDL HYPO 25X1 1.5 SAFETY (NEEDLE) ×1 IMPLANT
NEEDLE HYPO 25X1 1.5 SAFETY (NEEDLE) ×2 IMPLANT
NS IRRIG 1000ML POUR BTL (IV SOLUTION) ×3 IMPLANT
OIL CARTRIDGE MAESTRO DRILL (MISCELLANEOUS) ×2
PACK CRANIOTOMY (CUSTOM PROCEDURE TRAY) ×2 IMPLANT
PAD ARMBOARD 7.5X6 YLW CONV (MISCELLANEOUS) ×4 IMPLANT
PATTIES SURGICAL .25X.25 (GAUZE/BANDAGES/DRESSINGS) IMPLANT
PATTIES SURGICAL .5 X.5 (GAUZE/BANDAGES/DRESSINGS) IMPLANT
PATTIES SURGICAL .5 X3 (DISPOSABLE) IMPLANT
PATTIES SURGICAL 1X1 (DISPOSABLE) IMPLANT
PIN MAYFIELD SKULL DISP (PIN) IMPLANT
SPONGE NEURO XRAY DETECT 1X3 (DISPOSABLE) IMPLANT
SPONGE SURGIFOAM ABS GEL 100 (HEMOSTASIS) ×1 IMPLANT
STAPLER SKIN PROX WIDE 3.9 (STAPLE) IMPLANT
STAPLER VISISTAT 35W (STAPLE) ×2 IMPLANT
SUT ETHILON 3 0 FSL (SUTURE) IMPLANT
SUT NURALON 4 0 TR CR/8 (SUTURE) ×3 IMPLANT
SUT VIC AB 2-0 CT1 18 (SUTURE) ×2 IMPLANT
SYR CONTROL 10ML LL (SYRINGE) ×1 IMPLANT
TOWEL GREEN STERILE (TOWEL DISPOSABLE) ×2 IMPLANT
TOWEL GREEN STERILE FF (TOWEL DISPOSABLE) ×2 IMPLANT
TRAY FOLEY W/METER SILVER 16FR (SET/KITS/TRAYS/PACK) IMPLANT
UNDERPAD 30X30 (UNDERPADS AND DIAPERS) IMPLANT
WATER STERILE IRR 1000ML POUR (IV SOLUTION) ×2 IMPLANT

## 2017-03-05 NOTE — ED Notes (Signed)
Pt taken to CT. Will bring back to room 45. Brother at bedside.

## 2017-03-05 NOTE — ED Provider Notes (Signed)
[facility] DEPT Provider Note   CSN: [id number] Arrival date & time: 03/05/17  1135     History   Chief Complaint Chief Complaint  Patient presents with  . Altered Mental Status    HPI Tracy Price is a 61 y.o. female.  HPI level V caveat due to altered mental status  61 year old female presents today with her brother with reports of altered mental status. Brother notes approximately 2 weeks ago she called him reporting severe onset headache. She notes this was very rapid and severe and unlike anything previously. She called her primary care who placed her on antibiotics for presumed sinusitis. He notes he left to go out of town and had not spoken with her since then. He notes while he was gone he was receiving very odd text messages that did not make sense. He notes he arrived back in town yesterday and called her and noted that she was altered. Her baseline is high functioning and she lives alone significant cognitive issues. Patient takes aspirin, no prescribe anti-platelets or anticoagulations.  Patient unable to provide any significant information, she reports she does have a headache presently, feels like she cannot find the words she needs to express herself.   Further discussion with patient and her brother, patient questions if she had a fall prior to the incident.    Past Medical History:  Diagnosis Date  . Chest pain   . CKD (chronic kidney disease) stage 3, GFR 30-59 ml/min   . Diabetes mellitus without complication ([place])    type 2  . Hyperlipidemia   . Hypertension   . Sleep apnea 1995   had surgery to correct    Patient Active Problem List   Diagnosis Date Noted  . Family history of heart disease 02/25/2013  . Chest pain 02/25/2013  . Diabetes mellitus without complication ([place])   . Hyperlipidemia   . Hypertension     Past Surgical History:  Procedure Laterality Date  . GASTRIC BYPASS  2005    OB History    No data available       Home  Medications    Prior to Admission medications   Medication Sig Start Date End Date Taking? Authorizing Provider  amoxicillin (AMOXIL) 875 MG tablet Take 1 tablet (875 mg total) by mouth 2 (two) times daily. 02/17/17   Susy Frizzle, MD  atorvastatin (LIPITOR) 20 MG tablet Take 1 tablet (20 mg total) by mouth daily. 07/18/16   Susy Frizzle, MD  blood glucose meter kit and supplies Dispense based on patient and insurance preference. Use up to 3 times a day for e11.65 03/20/15   Susy Frizzle, MD  Empagliflozin-Linagliptin (GLYXAMBI) 25-5 MG TABS Take 1 tablet by mouth daily. 02/07/17   Susy Frizzle, MD  glucose blood test strip Use as instructed 01/03/16   Susy Frizzle, MD  ibuprofen (ADVIL,MOTRIN) 200 MG tablet Take 400 mg by mouth at bedtime as needed for pain.    [provider]  losartan (COZAAR) 100 MG tablet Take 1 tablet (100 mg total) by mouth daily. 05/01/16   Susy Frizzle, MD  Melatonin 1 MG TABS Take by mouth at bedtime as needed.    [provider]  [facility] DELICA LANCETS 69V MISC Checks BS TID 01/03/16   Susy Frizzle, MD  sertraline (ZOLOFT) 100 MG tablet TAKE 1 TABLET BY MOUTH EVERY DAY 05/08/16   Susy Frizzle, MD  traMADol (ULTRAM) 50 MG tablet TAKE 1 TABLET BY  MOUTH EVERY 6 HOURS AS NEEDED FOR PAIN 02/04/17   Susy Frizzle, MD    Family History Family History  Problem Relation Age of Onset  . Heart disease Mother   . Diabetes Sister 84       heart attack  . Heart disease Brother     Social History Social History  Substance Use Topics  . Smoking status: Never Smoker  . Smokeless tobacco: Never Used  . Alcohol use No     Allergies   Patient has no known allergies.   Review of Systems Review of Systems  Unable to perform ROS: Mental status change     Physical Exam Updated Vital Signs BP (!) 99/48   Pulse 95   Temp 99.5 F (37.5 C) (Oral)   Resp 14   SpO2 96%   Physical Exam  Constitutional: She is  oriented to person, place, and time. She appears well-developed.  HENT:  Minor bruising to the forehead- no deformities   Eyes: Conjunctivae and EOM are normal. Pupils are equal, round, and reactive to light. Right eye exhibits no discharge. Left eye exhibits no discharge. No scleral icterus.  Neck: Normal range of motion.  Cardiovascular: Regular rhythm, normal heart sounds and intact distal pulses.  Exam reveals no gallop and no friction rub.   No murmur heard. Pulmonary/Chest: Effort normal and breath sounds normal. No respiratory distress. She has no wheezes. She has no rales. She exhibits no tenderness.  Musculoskeletal:  Numerous small bruising to bilateral upper and lower extremities   Neurological: She is alert and oriented to person, place, and time. No cranial nerve deficit or sensory deficit. She exhibits normal muscle tone. Coordination abnormal.  Some confusion; difficult word finding, unable to recall events over the last several weeks  Skin: Skin is warm.  Nursing note and vitals reviewed.    ED Treatments / Results  Labs (all labs ordered are listed, but only abnormal results are displayed) Labs Reviewed  CBC - Abnormal; Notable for the following:       Result Value   WBC 13.9 (*)    Platelets 449 (*)    All other components within normal limits  DIFFERENTIAL - Abnormal; Notable for the following:    Neutro Abs 11.1 (*)    All other components within normal limits  COMPREHENSIVE METABOLIC PANEL - Abnormal; Notable for the following:    CO2 17 (*)    Glucose, Bld 178 (*)    BUN 30 (*)    Creatinine, Ser 1.89 (*)    Total Protein 8.6 (*)    ALT 12 (*)    Alkaline Phosphatase 178 (*)    GFR calc non Af Amer 28 (*)    GFR calc Af Amer 32 (*)    Anion gap 17 (*)    All other components within normal limits  CBG MONITORING, ED - Abnormal; Notable for the following:    Glucose-Capillary 167 (*)    All other components within normal limits  I-STAT CHEM 8, ED -  Abnormal; Notable for the following:    BUN 38 (*)    Creatinine, Ser 1.80 (*)    Glucose, Bld 181 (*)    Calcium, Ion 1.14 (*)    All other components within normal limits  URINE CULTURE  PROTIME-INR  APTT  URINALYSIS, ROUTINE W REFLEX MICROSCOPIC  I-STAT TROPOININ, ED  CBG MONITORING, ED    EKG  EKG Interpretation  Date/Time:  Wednesday Mar 05 2017 11:43:03 EDT Ventricular  Rate:  112 PR Interval:  154 QRS Duration: 116 QT Interval:  356 QTC Calculation: 485 R Axis:   -116 Text Interpretation:  Sinus tachycardia Right bundle branch block Possible Lateral infarct , age undetermined Cannot rule out Inferior infarct , age undetermined Abnormal ECG Confirmed by Abagail Kitchens MD, Harrington Challenger [phone]), editor Drema Pry [phone]) on 03/05/2017 11:56:48 AM       Radiology Ct Head Wo Contrast  Result Date: 03/05/2017 CLINICAL DATA:  Progressive confusion over the last 2 weeks. Abnormal gait. Difficulty finding words. Aphasia. EXAM: CT HEAD WITHOUT CONTRAST TECHNIQUE: Contiguous axial images were obtained from the base of the skull through the vertex without intravenous contrast. COMPARISON:  None. FINDINGS: Brain: A large mixed attenuation extra-axial collection is present over the left convexity. The deeper layers are of lower attenuation. Superficial layers are of higher attenuation with what appears be mixed blood products. Maximal dimension on the coronal images is 2.4 cm. There is significant midline shift measuring 12 mm at the foramen of Monro. There is marked effacement of the left lateral ventricle. Impending uncal herniation is noted. No parenchymal infarct, hemorrhage, or mass lesion is present. There is significant sulcal effacement over the left convexity compatible with the mass effect. The brainstem and cerebellum are normal. Vascular: Dense calcifications are present within the cavernous internal carotid artery's bilaterally. There is no associated hyperdense vessel. Skull: The  calvarium is intact. No focal lytic or blastic lesions are present. Sinuses/Orbits: The paranasal sinuses and mastoid air cells are clear. The globes and orbits are unremarkable. IMPRESSION: 1. Large left-sided extra-axial mixed attenuation collection compatible with acute and subacute subdural hematoma measuring 2.4 cm with mixed blood products. 2. Marked mass effect with effacement of the sulci over the left convexity, effacement of the left lateral ventricle, and 12 mm of midline shift. 3. Atherosclerosis. These results were called by telephone at the time of interpretation on 03/05/2017 at 12:54 pm to Dr. Carmin Muskrat , who verbally acknowledged these results. Electronically Signed   By: San Morelle M.D.   On: 03/05/2017 12:57    Procedures Procedures (including critical care time)  CRITICAL CARE Performed by: Elmer Ramp   Total critical care time: 35 minutes  Critical care time was exclusive of separately billable procedures and treating other patients.  Critical care was necessary to treat or prevent imminent or life-threatening deterioration.  Critical care was time spent personally by me on the following activities: development of treatment plan with patient and/or surrogate as well as nursing, discussions with consultants, evaluation of patient's response to treatment, examination of patient, obtaining history from patient or surrogate, ordering and performing treatments and interventions, ordering and review of laboratory studies, ordering and review of radiographic studies, pulse oximetry and re-evaluation of patient's condition.  Medications Ordered in ED Medications  sodium chloride 0.9 % bolus 1,000 mL (1,000 mLs Intravenous New Bag/Given 03/05/17 1339)  iopamidol (ISOVUE-370) 76 % injection (50 mLs  Contrast Given 03/05/17 1401)     Initial Impression / Assessment and Plan / ED Course  I have reviewed the triage vital signs and the nursing  notes.  Pertinent labs & imaging results that were available during my care of the patient were reviewed by me and considered in my medical decision making (see chart for details).      Final Clinical Impressions(s) / ED Diagnoses   Final diagnoses:  Subdural hematoma ([place])    Labs: I-STAT Chem-8, i-STAT troponin, CBG, PT/INR, APTT, CBC, CMP  Imaging: CT head WO/  CT angio   Consults:  Therapeutics: NS  Discharge Meds:   Assessment/Plan: 61 year old female presents today with subdural hematoma and altered mental status. Neurosurgery consult to Dr. Saintclair Halsted will see the patient here in the ED.  Patient was evaluated in the ED, I spoke with Dr. Saintclair Halsted who would like CTA.  CTA radiology tech informed me that her GFR was too low to protocol contrast. I spoke with on-call radiologist after speaking with Dr. Windy Carina nurse as this is a needed study. He'll proceed with CTA with low-dose contrast. Benefits of proper imaging study out weight the risk of contrast at this time in this critical patient.   Patient has remained stable throughout her stay in the ED without any significant changes.     New Prescriptions New Prescriptions   No medications on file       Francee Gentile 03/05/17 1423    Carmin Muskrat, MD 03/05/17 [phone]

## 2017-03-05 NOTE — ED Notes (Signed)
Neurosurgeon at bedside °

## 2017-03-05 NOTE — Anesthesia Preprocedure Evaluation (Addendum)
Anesthesia Evaluation  Patient identified by MRN, date of birth, ID band Patient confused    Reviewed: Allergy & Precautions, NPO status , Patient's Chart, lab work & pertinent test results, Unable to perform ROS - Chart review only  Airway Mallampati: II  TM Distance: >3 FB Neck ROM: Full    Dental no notable dental hx.    Pulmonary neg pulmonary ROS,    Pulmonary exam normal breath sounds clear to auscultation       Cardiovascular hypertension, Normal cardiovascular exam Rhythm:Regular Rate:Normal     Neuro/Psych negative neurological ROS  negative psych ROS   GI/Hepatic negative GI ROS, Neg liver ROS,   Endo/Other  diabetes  Renal/GU Renal InsufficiencyRenal disease  negative genitourinary   Musculoskeletal negative musculoskeletal ROS (+)   Abdominal   Peds negative pediatric ROS (+)  Hematology negative hematology ROS (+)   Anesthesia Other Findings   Reproductive/Obstetrics negative OB ROS                             Anesthesia Physical Anesthesia Plan  ASA: III and emergent  Anesthesia Plan: General   Post-op Pain Management:    Induction: Intravenous, Rapid sequence and Cricoid pressure planned  Airway Management Planned: Oral ETT  Additional Equipment: Arterial line  Intra-op Plan:   Post-operative Plan: Possible Post-op intubation/ventilation  Informed Consent: I have reviewed the patients History and Physical, chart, labs and discussed the procedure including the risks, benefits and alternatives for the proposed anesthesia with the patient or authorized representative who has indicated his/her understanding and acceptance.   Dental advisory given  Plan Discussed with: CRNA and Surgeon  Anesthesia Plan Comments:        Anesthesia Quick Evaluation

## 2017-03-05 NOTE — ED Notes (Signed)
OR ready for patient, can go to [facility]. Will give bedside report per OR. Dr. Saintclair Halsted wanting CT angio, GFR abnormal. ED PA to speak with radiologist.

## 2017-03-05 NOTE — Transfer of Care (Signed)
Immediate Anesthesia Transfer of Care Note  Patient: Tracy Price  Procedure(s) Performed: Procedure(s): LEFT BURR HOLE  HEMATOMA EVACUATION SUBDURAL (Left)  Patient Location: PACU  Anesthesia Type:General  Level of Consciousness: awake and alert   Airway & Oxygen Therapy: Patient Spontanous Breathing  Post-op Assessment: Report given to RN and Patient moving all extremities X 4  Post vital signs: Reviewed and stable  Last Vitals:  Vitals:   03/05/17 1330 03/05/17 1358  BP: 132/79 (!) 99/48  Pulse: 95 95  Resp: 14   Temp:      Last Pain:  Vitals:   03/05/17 1304  TempSrc:   PainSc: 7          Complications: No apparent anesthesia complications

## 2017-03-05 NOTE — ED Notes (Signed)
Called CT to make aware of need for STAT CT head. Made aware. Pt has 20 gauge left AC.

## 2017-03-05 NOTE — ED Notes (Signed)
CT ready for patient. Will take her to CT then to OR.

## 2017-03-05 NOTE — Progress Notes (Signed)
[facility] Progress Note Patient Name: Tracy Price DOB: 12-18-1955 MRN: [id number]   Date of Service  03/05/2017  HPI/Events of Note  Patient admitted for acute on chronic subdural hematoma. Status post burr hole evacuation by neurosurgery. Extubated postoperatively. Camera check shows patient resting comfortably with family member at bedside. Respiratory status stable.   eICU Interventions  Continuing current plan of care as per admitting service.      Intervention Category Evaluation Type: New Patient Evaluation  Tera Partridge 03/05/2017, 5:47 PM

## 2017-03-05 NOTE — Op Note (Signed)
Preoperative diagnosis: Left-sided subacute on chronic subdural hematoma  Postoperative diagnosis: Same  Procedure: Left-sided burr hole craniectomy for evacuation of left subacute on chronic subdural hematoma  Surgeon: Dominica Severin Marcel Sorter  Asst.: Glenford Peers  Anesthesia: Gen.  EBL: Minimal  History of present illness: Patient is a 61 year old female with a 2 week presentation of increased confusion headaches dizziness and unsteadiness on her feet. Patient reported potential fall 2-3 weeks ago as well as a sudden onset severe headache. Workup in emergency included CT of her head as well as CT angiogram showed no evidence of vascular malformation did show some atherosclerosis however no other vascular abnormality. Patient was recommended burr hole craniectomy and possible craniotomy for evacuation of subdural hematoma risks and benefits of the operation as well as perioperative course expectations of outcome and alternatives were explained to her and her brother and they agreed to proceed forward.  Operative procedure: Patient brought in the or was induced on general anesthesia positioned supine the neck in slight extension shoulder bump under her left shoulder return to the right side the left side of her head was shaved prepped and draped in routine sterile fashion 2 bur holes were drilled one frontally 1 parietally after infiltration of 5 mL lidocaine with epi dura was then coagulated and incised in a cruciate fashion and was a dense subdural membrane once this was incised dark old blood medially came out under pressure. This was irrigated from the frontal to the parietal bur hole the cortex immediately reexpanded and filled up the parietal burthe frontal burr hole and the brain did [place] in the sterile was slack. Cortex was visualized from both locations. I utilized a 43 Architect catheter to irrigate throughout the burr hole left-sided his frontoparietal and temporal lobes. Once nothing the clear  irrigant was seen and closed both bur holes with interrupted Vicryl and staples in the skin. Wounds were dressed patient recovered in stable condition. In the case all needle counts sponge counts were correct.

## 2017-03-05 NOTE — Anesthesia Procedure Notes (Signed)
Procedure Name: Intubation Date/Time: 03/05/2017 2:54 PM Performed by: Sampson Si E Pre-anesthesia Checklist: Patient identified, Emergency Drugs available, Suction available and Patient being monitored Patient Re-evaluated:Patient Re-evaluated prior to inductionOxygen Delivery Method: Circle System Utilized Preoxygenation: Pre-oxygenation with 100% oxygen Intubation Type: IV induction Ventilation: Mask ventilation without difficulty Laryngoscope Size: Mac and 3 Grade View: Grade I Tube type: Subglottic suction tube Tube size: 7.5 mm Number of attempts: 1 Airway Equipment and Method: Stylet and Oral airway Placement Confirmation: ETT inserted through vocal cords under direct vision,  positive ETCO2 and breath sounds checked- equal and bilateral Secured at: 21 cm Tube secured with: Tape Dental Injury: Teeth and Oropharynx as per pre-operative assessment

## 2017-03-05 NOTE — H&P (Signed)
Tracy Price is an 61 y.o. female.   Chief Complaint: Confusion speech change and headaches HPI: 61 year old female who 2 weeks ago experienced a sudden onset worst headache of her life and either before or after that had a fall it's unclear at the sequence of events patient did call her brother headache its waxed and waned over the last couple weeks she's been treated for sinuses headache got a little better however confusion and mental status have declined patient brought to the ER. Currently the patient complains of headache and some confusion the brother reports difficulty with her speech and unsteadiness with her gait. 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  Past Medical History:  Diagnosis Date  . Chest pain   . CKD (chronic kidney disease) stage 3, GFR 30-59 ml/min   . Diabetes mellitus without complication ([place])    type 2  . Hyperlipidemia   . Hypertension   . Sleep apnea 1995   had surgery to correct    Past Surgical History:  Procedure Laterality Date  . GASTRIC BYPASS  2005    Family History  Problem Relation Age of Onset  . Heart disease Mother   . Diabetes Sister 89       heart attack  . Heart disease Brother    Social History:  reports that she has never smoked. She has never used smokeless tobacco. She reports that she does not drink alcohol or use drugs.  Allergies: No Known Allergies   (Not in a hospital admission)  Results for orders placed or performed during the hospital encounter of 03/05/17 (from the past 48 hour(s))  CBG monitoring, ED     Status: Abnormal   Collection Time: 03/05/17 11:49 AM  Result Value Ref Range   Glucose-Capillary 167 (H) 65 - 99 mg/dL  Protime-INR     Status: None   Collection Time: 03/05/17 11:49 AM  Result Value Ref Range   Prothrombin Time 14.7 11.4 - 15.2 seconds   INR 1.15   APTT     Status: None   Collection Time: 03/05/17 11:49 AM  Result Value Ref Range   aPTT 30 24 - 36 seconds  CBC     Status: Abnormal   Collection Time: 03/05/17 11:49 AM  Result Value Ref Range   WBC 13.9 (H) 4.0 - 10.5 K/uL   RBC 4.76 3.87 - 5.11 MIL/uL   Hemoglobin 13.9 12.0 - 15.0 g/dL   HCT 42.1 36.0 - 46.0 %   MCV 88.4 78.0 - 100.0 fL   MCH 29.2 26.0 - 34.0 pg   MCHC 33.0 30.0 - 36.0 g/dL   RDW 12.8 11.5 - 15.5 %   Platelets 449 (H) 150 - 400 K/uL  Differential     Status: Abnormal   Collection Time: 03/05/17 11:49 AM  Result Value Ref Range   Neutrophils Relative % 80 %   Neutro Abs 11.1 (H) 1.7 - 7.7 K/uL   Lymphocytes Relative 13 %   Lymphs Abs 1.8 0.7 - 4.0 K/uL   Monocytes Relative 5 %   Monocytes Absolute 0.7 0.1 - 1.0 K/uL   Eosinophils Relative 2 %   Eosinophils Absolute 0.2 0.0 - 0.7 K/uL   Basophils Relative 0 %   Basophils Absolute 0.0 0.0 - 0.1 K/uL  Comprehensive metabolic panel     Status: Abnormal   Collection Time: 03/05/17 11:49 AM  Result Value Ref Range   Sodium 136 135 - 145 mmol/L  Potassium 3.7 3.5 - 5.1 mmol/L   Chloride 102 101 - 111 mmol/L   CO2 17 (L) 22 - 32 mmol/L   Glucose, Bld 178 (H) 65 - 99 mg/dL   BUN 30 (H) 6 - 20 mg/dL   Creatinine, Ser 1.89 (H) 0.44 - 1.00 mg/dL   Calcium 9.4 8.9 - 10.3 mg/dL   Total Protein 8.6 (H) 6.5 - 8.1 g/dL   Albumin 3.9 3.5 - 5.0 g/dL   AST 15 15 - 41 U/L   ALT 12 (L) 14 - 54 U/L   Alkaline Phosphatase 178 (H) 38 - 126 U/L   Total Bilirubin 1.0 0.3 - 1.2 mg/dL   GFR calc non Af Amer 28 (L) >60 mL/min   GFR calc Af Amer 32 (L) >60 mL/min    Comment: (NOTE) The eGFR has been calculated using the CKD EPI equation. This calculation has not been validated in all clinical situations. eGFR's persistently <60 mL/min signify possible Chronic Kidney Disease.    Anion gap 17 (H) 5 - 15  I-stat troponin, ED     Status: None   Collection Time: 03/05/17 12:01 PM  Result Value Ref Range   Troponin i, poc 0.04 0.00 - 0.08 ng/mL   Comment 3            Comment: Due to the release kinetics of  cTnI, a negative result within the first hours of the onset of symptoms does not rule out myocardial infarction with certainty. If myocardial infarction is still suspected, repeat the test at appropriate intervals.   I-Stat Chem 8, ED     Status: Abnormal   Collection Time: 03/05/17 12:03 PM  Result Value Ref Range   Sodium 138 135 - 145 mmol/L   Potassium 3.9 3.5 - 5.1 mmol/L   Chloride 103 101 - 111 mmol/L   BUN 38 (H) 6 - 20 mg/dL   Creatinine, Ser 1.80 (H) 0.44 - 1.00 mg/dL   Glucose, Bld 181 (H) 65 - 99 mg/dL   Calcium, Ion 1.14 (L) 1.15 - 1.40 mmol/L   TCO2 20 0 - 100 mmol/L   Hemoglobin 15.0 12.0 - 15.0 g/dL   HCT 44.0 36.0 - 46.0 %   Ct Head Wo Contrast  Result Date: 03/05/2017 CLINICAL DATA:  Progressive confusion over the last 2 weeks. Abnormal gait. Difficulty finding words. Aphasia. EXAM: CT HEAD WITHOUT CONTRAST TECHNIQUE: Contiguous axial images were obtained from the base of the skull through the vertex without intravenous contrast. COMPARISON:  None. FINDINGS: Brain: A large mixed attenuation extra-axial collection is present over the left convexity. The deeper layers are of lower attenuation. Superficial layers are of higher attenuation with what appears be mixed blood products. Maximal dimension on the coronal images is 2.4 cm. There is significant midline shift measuring 12 mm at the foramen of Monro. There is marked effacement of the left lateral ventricle. Impending uncal herniation is noted. No parenchymal infarct, hemorrhage, or mass lesion is present. There is significant sulcal effacement over the left convexity compatible with the mass effect. The brainstem and cerebellum are normal. Vascular: Dense calcifications are present within the cavernous internal carotid artery's bilaterally. There is no associated hyperdense vessel. Skull: The calvarium is intact. No focal lytic or blastic lesions are present. Sinuses/Orbits: The paranasal sinuses and mastoid air cells are  clear. The globes and orbits are unremarkable. IMPRESSION: 1. Large left-sided extra-axial mixed attenuation collection compatible with acute and subacute subdural hematoma measuring 2.4 cm with mixed blood products. 2.  Marked mass effect with effacement of the sulci over the left convexity, effacement of the left lateral ventricle, and 12 mm of midline shift. 3. Atherosclerosis. These results were called by telephone at the time of interpretation on 03/05/2017 at 12:54 pm to Dr. Carmin Muskrat , who verbally acknowledged these results. Electronically Signed   By: San Morelle M.D.   On: 03/05/2017 12:57    Review of Systems  Neurological: Positive for dizziness, speech change and headaches.    Blood pressure (!) 99/48, pulse 95, temperature 99.5 F (37.5 C), temperature source Oral, resp. rate 14, SpO2 96 %. Physical Exam  Constitutional: She is oriented to person, place, and time. She appears well-developed and well-nourished.  HENT:  Head: Normocephalic.  Eyes: Pupils are equal, round, and reactive to light.  Neck: Normal range of motion.  GI: Soft.  Neurological: She is alert and oriented to person, place, and time. GCS eye subscore is 4. GCS verbal subscore is 5. GCS motor subscore is 6.  Patient is awake alert is somewhat confused and has some naming difficulty with perseveration but does follow commands strength is 5 out of 5 in her left upper and lower extremity she has a right pronator drift. Pupils are equal and reactive extraocular limits are intact cranial nerves are otherwise intact  Skin: Skin is warm and dry.     Assessment/Plan 61 year old female a 2-1/2 cm subacute to chronic subdural hematoma with an unusual history. Patient did report a sudden onset worst headache of her life but it's unclear whether this preceded or post dated the fall. So I'm going to have to order a CTA scan to rule out aneurysm prior to burr hole possible craniotomy for evacuation of left-sided  subdural hematoma. I've extensively gone over the diagnosis procedure the risks and benefits as well as perioperative course expectations of outcome alternatives of surgery and they understand and agree to proceed forward.  , P, MD 03/05/2017, 1:59 PM

## 2017-03-05 NOTE — ED Triage Notes (Signed)
Pt to ER accompanied by brother who states patient lives alone and two weeks ago he came home from being out of town to find her extremely confused. States this has been consistent and has only gotten worse. Pt states she feels generally weak, brother states patient has not been ambulating normally, is unable to elaborate on this. Pt is answering questions appropriately, but having a difficult time finding words.

## 2017-03-05 NOTE — Anesthesia Postprocedure Evaluation (Signed)
Anesthesia Post Note  Patient: Tracy Price  Procedure(s) Performed: Procedure(s) (LRB): LEFT BURR HOLE  HEMATOMA EVACUATION SUBDURAL (Left)  Patient location during evaluation: PACU Anesthesia Type: General Level of consciousness: awake and alert Pain management: pain level controlled Vital Signs Assessment: post-procedure vital signs reviewed and stable Respiratory status: spontaneous breathing, nonlabored ventilation, respiratory function stable and patient connected to nasal cannula oxygen Cardiovascular status: blood pressure returned to baseline and stable Postop Assessment: no signs of nausea or vomiting Anesthetic complications: no       Last Vitals:  Vitals:   03/05/17 1615 03/05/17 1630  BP: 134/77 133/79  Pulse: 94 100  Resp: 18 15  Temp:      Last Pain:  Vitals:   03/05/17 1304  TempSrc:   PainSc: 7                  [name] S

## 2017-03-05 NOTE — Progress Notes (Signed)
Art line pulled at 2020. Manual pressure held until 2035, with no signs of bleeding noted. Will continue to monitor. Lianne Bushy RN BSN.

## 2017-03-06 ENCOUNTER — Encounter (HOSPITAL_COMMUNITY): Payer: Self-pay | Admitting: Neurosurgery

## 2017-03-06 ENCOUNTER — Inpatient Hospital Stay (HOSPITAL_COMMUNITY): Payer: BC Managed Care – PPO

## 2017-03-06 LAB — BASIC METABOLIC PANEL
Anion gap: 14 (ref 5–15)
BUN: 24 mg/dL — AB (ref 6–20)
CALCIUM: 8.8 mg/dL — AB (ref 8.9–10.3)
CO2: 15 mmol/L — AB (ref 22–32)
CREATININE: 1.44 mg/dL — AB (ref 0.44–1.00)
Chloride: 109 mmol/L (ref 101–111)
GFR calc non Af Amer: 39 mL/min — ABNORMAL LOW (ref >60)
GFR, EST AFRICAN AMERICAN: 45 mL/min — AB (ref >60)
GLUCOSE: 108 mg/dL — AB (ref 65–99)
Potassium: 4.2 mmol/L (ref 3.5–5.1)
Sodium: 138 mmol/L (ref 135–145)

## 2017-03-06 LAB — URINE CULTURE: CULTURE: NO GROWTH

## 2017-03-06 LAB — GLUCOSE, CAPILLARY
Glucose-Capillary: 122 mg/dL — ABNORMAL HIGH (ref 65–99)
Glucose-Capillary: 145 mg/dL — ABNORMAL HIGH (ref 65–99)
Glucose-Capillary: 97 mg/dL (ref 65–99)
Glucose-Capillary: 124 mg/dL — ABNORMAL HIGH (ref 65–99)

## 2017-03-06 MED ORDER — MENTHOL 3 MG MT LOZG
1.0000 | LOZENGE | OROMUCOSAL | Status: DC | PRN
  Filled 2017-03-06: qty 9

## 2017-03-06 NOTE — Progress Notes (Signed)
Patient ID: Tracy Price, female   DOB: [birth year], 61 y.o.   MRN: [id number] Patient doing well significant improvement in headache still little bit anxious and anxiety but neurologically no focal  Awake alert oriented strength out of 5 minimal right pronator drift  CT scan significant improvement continued observe in the ICU mobilize with physical and occupational therapy

## 2017-03-06 NOTE — Progress Notes (Signed)
NEUROSURGERY PROGRESS NOTE  Doing well. Complains of appropriate headaches. No numbness, tingling or weakness Good strength and sensation Incision CDI Alert and oriented x4  Temp:  [97.5 F (36.4 C)-99.5 F (37.5 C)] 98.5 F (36.9 C) (05/31 0400) Pulse Rate:  [80-115] 96 (05/31 0700) Resp:  [10-21] 10 (05/31 0700) BP: (99-150)/(48-90) 125/78 (05/31 0700) SpO2:  [95 %-100 %] 100 % (05/31 0700) Arterial Line BP: (155-162)/(89-116) 155/116 (05/30 2000)  Plan: Continue to monitor with pain medications. Transitioning to oral. Nurse reports occasional expressive aphasia.   Tracy Chiquito, NP 03/06/2017 7:59 AM

## 2017-03-06 NOTE — Care Management Note (Signed)
Case Management Note  Patient Details  Name: Tracy Price MRN: [id number] Date of Birth: 09-02-1956  Subjective/Objective:  Pt admitted on 03/05/17 s/p SDH with [facility] procedure.  PTA, pt independent, lives at home alone.                    Action/Plan: Will follow for discharge planning as pt progresses.  PT/OT consults pending.    Expected Discharge Date:                  Expected Discharge Plan:     In-House Referral:     Discharge planning Services  CM Consult  Post Acute Care Choice:    Choice offered to:     DME Arranged:    DME Agency:     HH Arranged:    HH Agency:     Status of Service:  In process, will continue to follow  If discussed at Long Length of Stay Meetings, dates discussed:    Additional Comments:  Reinaldo Raddle, RN, BSN  Trauma/Neuro ICU Case Manager [phone]

## 2017-03-07 DIAGNOSIS — S098XXA Other specified injuries of head, initial encounter: Secondary | ICD-10-CM

## 2017-03-07 DIAGNOSIS — S06303S Unspecified focal traumatic brain injury with loss of consciousness of 1 hour to 5 hours 59 minutes, sequela: Secondary | ICD-10-CM

## 2017-03-07 DIAGNOSIS — R4701 Aphasia: Secondary | ICD-10-CM

## 2017-03-07 LAB — GLUCOSE, CAPILLARY
GLUCOSE-CAPILLARY: 129 mg/dL — AB (ref 65–99)
GLUCOSE-CAPILLARY: 179 mg/dL — AB (ref 65–99)
GLUCOSE-CAPILLARY: 156 mg/dL — AB (ref 65–99)
GLUCOSE-CAPILLARY: 367 mg/dL — AB (ref 65–99)

## 2017-03-07 MED ORDER — FUROSEMIDE 10 MG/ML IJ SOLN
10.0000 mg | Freq: Once | INTRAMUSCULAR | Status: AC
  Administered 2017-03-07: 10 mg via INTRAVENOUS
  Filled 2017-03-07: qty 2

## 2017-03-07 MED ORDER — ALBUTEROL SULFATE (2.5 MG/3ML) 0.083% IN NEBU: 2.5000 mg | INHALATION_SOLUTION | Freq: Once | RESPIRATORY_TRACT | Status: DC

## 2017-03-07 MED ORDER — INSULIN ASPART 100 UNIT/ML ~~LOC~~ SOLN
0.0000 [IU] | SUBCUTANEOUS | Status: DC
  Administered 2017-03-08: 5 [IU] via SUBCUTANEOUS
  Administered 2017-03-08: 3 [IU] via SUBCUTANEOUS

## 2017-03-07 MED ORDER — DEXAMETHASONE SODIUM PHOSPHATE 10 MG/ML IJ SOLN
10.0000 mg | Freq: Four times a day (QID) | INTRAMUSCULAR | Status: AC
  Administered 2017-03-07 (×2): 10 mg via INTRAVENOUS
  Filled 2017-03-07 (×2): qty 1

## 2017-03-07 MED ORDER — PANTOPRAZOLE SODIUM 40 MG PO TBEC
40.0000 mg | DELAYED_RELEASE_TABLET | Freq: Every day | ORAL | Status: DC
  Administered 2017-03-07 – 2017-03-09 (×3): 40 mg via ORAL
  Filled 2017-03-07 (×3): qty 1

## 2017-03-07 NOTE — Evaluation (Signed)
Physical Therapy Evaluation Patient Details Name: Tracy Price MRN: [id number] DOB: May 30, 1956 Today's Date: 03/07/2017   History of Present Illness  Patient is a 61 y/o female who presents with speech changes and slurred speech as well as a fall. Head CT- Large left-sided extra-axial mixed attenuation collection compatible with acute/subacute SDH measuring 2.4 midline shift. s/p burr hole evacuation. PMH includes HTN, HLD, DM, CKD.  Clinical Impression  Patient presents with impaired balance, incoordination RUE/LE, mild ataxia, expressive aphasia and impaired mobility s/p above. Tolerated gait training with Min A for balance/safety. Pt independent PTA. Has supportive brother who can assist at d/c. Concerned about going home without rehab and very motivated to return to PLOF. Appropriate for intensive therapies and CIR. Will follow acutely to maximize independence and mobility prior to return home.    Follow Up Recommendations CIR;Supervision for mobility/OOB;Supervision/Assistance - 24 hour    Equipment Recommendations  Other (comment) (TBA)    Recommendations for Other Services Rehab consult;Speech consult     Precautions / Restrictions Precautions Precautions: Fall Restrictions Weight Bearing Restrictions: No      Mobility  Bed Mobility Overal bed mobility: Needs Assistance Bed Mobility: Supine to Sit     Supine to sit: Min guard;HOB elevated     General bed mobility comments: Increased time to get to EOB but no assist needed.   Transfers Overall transfer level: Needs assistance Equipment used: None;Rolling walker (2 wheeled) Transfers: Sit to/from Stand Sit to Stand: Min guard         General transfer comment: MIn guard for safety to stand from EOB. Some ataxia noted in standing. Stood from toilet without RW.   Ambulation/Gait Ambulation/Gait assistance: Min assist Ambulation Distance (Feet): 30 Feet Assistive device: Rolling walker (2 wheeled);None Gait  Pattern/deviations: Step-through pattern;Decreased stride length;Ataxic Gait velocity: decreased Gait velocity interpretation: Below normal speed for age/gender General Gait Details: Slow, unsteady gait with mild ataxia noted throughout. VSS. Short step lengths bilaterally and unsure of self.  Stairs            Wheelchair Mobility    Modified Rankin (Stroke Patients Only) Modified Rankin (Stroke Patients Only) Pre-Morbid Rankin Score: No symptoms Modified Rankin: Moderately severe disability     Balance Overall balance assessment: Needs assistance Sitting-balance support: Feet supported;No upper extremity supported Sitting balance-Leahy Scale: Fair     Standing balance support: During functional activity Standing balance-Leahy Scale: Fair Standing balance comment: Able to stand at sink and wash hands with Min A for balance.                              Pertinent Vitals/Pain Pain Assessment: Faces Faces Pain Scale: Hurts little more Pain Location: head Pain Descriptors / Indicators: Headache Pain Intervention(s): Monitored during session;Repositioned;Premedicated before session    Home Living Family/patient expects to be discharged to:: Private residence Living Arrangements: Alone Available Help at Discharge: Family (brother can provide assist at d/c.) Type of Home: House Home Access: Stairs to enter Entrance Stairs-Rails: Right;Left;Can reach both Technical brewer of Steps: 6 Home Layout: One level Home Equipment: None      Prior Function Level of Independence: Independent         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     Hand Dominance   Dominant Hand: Right    Extremity/Trunk Assessment   Upper Extremity Assessment Upper Extremity Assessment: Defer to OT  evaluation    Lower Extremity Assessment Lower Extremity Assessment: Generalized weakness RLE  Deficits / Details: Dysadiadochokinesia RLE during heel to shin.  RLE Sensation:  [facility])       Communication   Communication: Expressive difficulties  Cognition Arousal/Alertness: Awake/alert Behavior During Therapy: WFL for tasks assessed/performed Overall Cognitive Status: Impaired/Different from baseline Area of Impairment: Memory;Following commands;Problem solving                     Memory: Decreased short-term memory Following Commands: Follows one step commands with increased time (repetition/cues)       General Comments: Some expressive aphasia noted. Sometimes goes on tangents unrelated to questions asked. Curious about what happened and wanted more info. RN to give.       General Comments      Exercises     Assessment/Plan    PT Assessment Patient needs continued PT services  PT Problem List Decreased mobility;Decreased strength;Decreased safety awareness;Decreased coordination;Decreased activity tolerance;Decreased cognition;Decreased balance;Decreased knowledge of use of DME       PT Treatment Interventions Therapeutic activities;Gait training;Therapeutic exercise;Patient/family education;Balance training;Functional mobility training;Stair training;DME instruction;Neuromuscular re-education    PT Goals (Current goals can be found in the Care Plan section)  Acute Rehab PT Goals Patient Stated Goal: to go to rehab so I can return to normal PT Goal Formulation: With patient Time For Goal Achievement: 03/21/17 Potential to Achieve Goals: Fair    Frequency Min 4X/week   Barriers to discharge Inaccessible home environment      Co-evaluation PT/OT/SLP Co-Evaluation/Treatment: Yes Reason for Co-Treatment: To address functional/ADL transfers;For patient/therapist safety PT goals addressed during session: Mobility/safety with mobility;Balance         AM-PAC PT "6 Clicks" Daily Activity  Outcome Measure Difficulty turning over in bed (including  adjusting bedclothes, sheets and blankets)?: None Difficulty moving from lying on back to sitting on the side of the bed? : None Difficulty sitting down on and standing up from a chair with arms (e.g., wheelchair, bedside commode, etc,.)?: Total Help needed moving to and from a bed to chair (including a wheelchair)?: A Little Help needed walking in hospital room?: A Little Help needed climbing 3-5 steps with a railing? : A Lot 6 Click Score: 17    End of Session Equipment Utilized During Treatment: Gait belt Activity Tolerance: Patient tolerated treatment well Patient left: in chair;with call bell/phone within reach;with nursing/sitter in room Nurse Communication: Mobility status PT Visit Diagnosis: Unsteadiness on feet (R26.81);Difficulty in walking, not elsewhere classified (R26.2);Other abnormalities of gait and mobility (R26.89);Ataxic gait (R26.0)    Time: 2536-6440 PT Time Calculation (min) (ACUTE ONLY): 32 min   Charges:   PT Evaluation $PT Eval Moderate Complexity: 1 Procedure     PT G Codes:        Wray Kearns, PT, DPT [phone]    Marguarite Arbour A Karlin Binion 03/07/2017, 12:34 PM

## 2017-03-07 NOTE — Progress Notes (Signed)
Stopped by to visit, bur receiving briefing from med staff. Will try again later.   03/07/17 1200  Clinical Encounter Type  Visited With Patient not available  Visit Type Initial  Referral From [place] Mallorey Odonell, Chaplain

## 2017-03-07 NOTE — Plan of Care (Signed)
Problem: Education: Goal: Knowledge of the prescribed therapeutic regimen will improve Outcome: Progressing Patient verbalizes understanding

## 2017-03-07 NOTE — Progress Notes (Signed)
Patient ID: Tracy Price, female   DOB: [birth year], 61 y.o.   MRN: [id number]   overall doing well no headaches or nausea  awake alert and confused remains dysarthric strength is stable pronator drift is improved  Continued observed another 24 hours repeat CT scan in the morning work with physical outpatient therapy and agree with inpatient rehabilitation.

## 2017-03-07 NOTE — Consult Note (Signed)
Physical Medicine and Rehabilitation Consult Reason for Consult: Decreased functional mobility with aphasia Referring Physician: Dr.Cram   HPI: Tracy Price is a 61 y.o. right handed female with history of CKD stage III, diabetes mellitus, hypertension, hyperlipidemia. Per chart review patient lives alone and was independent prior to admission. One level home 6 steps to entry. Brother in area can assist. Presented 03/05/2017 with sudden onset of headache with slurred speech as well as reported fall.  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 Underwent left-sided burr hole craniectomy for evacuation of subdural hematoma 03/05/2017 per Dr. Saintclair Halsted. Keppra for seizure prophylaxis. Decadron protocol is indicated. Physical therapy evaluation completed 03/07/2017 with recommendations of physical medicine rehabilitation consult.   Review of Systems  Constitutional: Negative for chills and fever.  HENT: Negative for hearing loss.   Eyes: Negative for blurred vision and double vision.  Respiratory: Negative for cough and shortness of breath.   Cardiovascular: Negative for chest pain, palpitations and leg swelling.  Gastrointestinal: Positive for constipation. Negative for nausea and vomiting.  Genitourinary: Negative for dysuria, flank pain and hematuria.  Musculoskeletal: Positive for joint pain and myalgias.  Skin: Negative for rash.  Neurological: Positive for speech change and headaches.  All other systems reviewed and are negative.  Past Medical History:  Diagnosis Date  . Chest pain   . CKD (chronic kidney disease) stage 3, GFR 30-59 ml/min   . Diabetes mellitus without complication ([place])    type 2  . Hyperlipidemia   . Hypertension   . Sleep apnea 1995   had surgery to correct   Past Surgical History:  Procedure Laterality Date  . CRANIOTOMY Left 03/05/2017   Procedure: LEFT BURR HOLE  HEMATOMA EVACUATION SUBDURAL;  Surgeon: Kary Kos, MD;   Location: [facility];  Service: Neurosurgery;  Laterality: Left;  Marland Kitchen GASTRIC BYPASS  2005   Family History  Problem Relation Age of Onset  . Heart disease Mother   . Diabetes Sister 75       heart attack  . Heart disease Brother    Social History:  reports that she has never smoked. She has never used smokeless tobacco. She reports that she does not drink alcohol or use drugs. Allergies:  Allergies  Allergen Reactions  . Crestor [Rosuvastatin Calcium] Other (See Comments)    Mylgia  . Lipitor [Atorvastatin] Other (See Comments)    Mylagia   Medications Prior to Admission  Medication Sig Dispense Refill  . Empagliflozin-Linagliptin (GLYXAMBI) 25-5 MG TABS Take 1 tablet by mouth daily. 90 tablet 3  . ibuprofen (ADVIL,MOTRIN) 200 MG tablet Take 400 mg by mouth at bedtime as needed for pain.    Marland Kitchen losartan (COZAAR) 100 MG tablet Take 1 tablet (100 mg total) by mouth daily. 90 tablet 3  . Melatonin 1 MG TABS Take 1 mg by mouth at bedtime as needed.     . sertraline (ZOLOFT) 100 MG tablet TAKE 1 TABLET BY MOUTH EVERY DAY 90 tablet 3  . traMADol (ULTRAM) 50 MG tablet TAKE 1 TABLET BY MOUTH EVERY 6 HOURS AS NEEDED FOR PAIN 180 tablet 0  . atorvastatin (LIPITOR) 20 MG tablet Take 1 tablet (20 mg total) by mouth daily. 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 Functional Status:  Mobility: Bed  Mobility Overal bed mobility: Needs Assistance Bed Mobility: Supine to Sit Supine to sit: Min guard, HOB elevated General bed mobility comments: Increased time to get to EOB but no assist needed.  Transfers Overall transfer level: Needs assistance Equipment used: None, Rolling walker (2 wheeled) Transfers: Sit to/from Stand Sit to Stand: Min guard General transfer comment: MIn guard for safety to stand from EOB. Some ataxia noted in standing. Stood from toilet without RW.  Ambulation/Gait Ambulation/Gait assistance: Min assist Ambulation Distance (Feet): 30 Feet Assistive device: Rolling walker (2 wheeled), None Gait Pattern/deviations: Step-through pattern, Decreased stride length, Ataxic General Gait Details: Slow, unsteady gait with mild ataxia noted throughout. VSS. Short step lengths bilaterally and unsure of self. Gait velocity: decreased Gait velocity interpretation: Below normal speed for age/gender    ADL:    Cognition: Cognition Overall Cognitive Status: Impaired/Different from baseline Orientation Level: Oriented X4 Cognition Arousal/Alertness: Awake/alert Behavior During Therapy: WFL for tasks assessed/performed Overall Cognitive Status: Impaired/Different from baseline Area of Impairment: Memory, Following commands, Problem solving Memory: Decreased short-term memory Following Commands: Follows one step commands with increased time (repetition/cues) General Comments: Some expressive aphasia noted. Sometimes goes on tangents unrelated to questions asked. Curious about what happened and wanted more info. RN to give.   Blood pressure (!) 139/101, pulse 84, temperature 98.4 F (36.9 C), temperature source Oral, resp. rate 13, SpO2 98 %. Physical Exam  HENT:  Craniotomy site clean and dry  Eyes: EOM are normal.  Neck: Normal range of motion. Neck supple. No thyromegaly present.  Cardiovascular: Normal rate and regular rhythm.   Respiratory: Effort normal and  breath sounds normal. No respiratory distress.  GI: Soft. Bowel sounds are normal. She exhibits no distension.  Neurological: She is alert.  Mixed aphasia but able to express thoughts and converse with extra time. Mild right central 7. Strength 4-/5 RUE and 4- to 4/5 RLE. LUE and LLE 4+ to 5/5. Senses pain in all 4's. Reasonable insight and awareness.      Results for orders placed or performed during the hospital encounter of 03/05/17 (from the past 24 hour(s))  Glucose, capillary     Status: None   Collection Time: 03/06/17  3:44 PM  Result Value Ref Range   Glucose-Capillary 97 65 - 99 mg/dL  Glucose, capillary     Status: Abnormal   Collection Time: 03/06/17  9:30 PM  Result Value Ref Range   Glucose-Capillary 124 (H) 65 - 99 mg/dL   Comment 1 Notify RN    Comment 2 Document in Chart   Glucose, capillary     Status: Abnormal   Collection Time: 03/07/17  7:31 AM  Result Value Ref Range   Glucose-Capillary 129 (H) 65 - 99 mg/dL  Glucose, capillary     Status: Abnormal   Collection Time: 03/07/17 11:31 AM  Result Value Ref Range   Glucose-Capillary 179 (H) 65 - 99 mg/dL   Ct Angio Head W Or Wo Contrast  Result Date: 03/05/2017 CLINICAL DATA:  61 year old female with mixed density left subdural hematoma. Progressive altered mental status and neurologic deficits for 2 weeks. EXAM: CT ANGIOGRAPHY HEAD TECHNIQUE: Multidetector CT imaging of the  head was performed using the standard protocol during bolus administration of intravenous contrast. Multiplanar CT image reconstructions and MIPs were obtained to evaluate the vascular anatomy. CONTRAST:  50 mL Isovue 370 COMPARISON:  Head CT without contrast 1238 hours today FINDINGS: Posterior circulation: Codominant distal vertebral arteries with mild irregularity but no stenosis. Patent PICA origins. Patent vertebrobasilar junction. Patent basilar artery. Mild distal basilar irregularity without significant stenosis. Patent SCA and PCA origins.  Posterior communicating arteries are diminutive or absent. Mild bilateral P1 and P2 segment irregularity without stenosis. Anterior circulation: Patent distal cervical ICAs. Severe bilateral ICA siphon calcified plaque. Both siphons are patent but there is severe bilateral siphon stenosis affecting the left cavernous segment and right anterior genu (series 7 image 115). Normal ophthalmic artery origins. Patent carotid termini. Normal MCA and ACA origins. The left A1 segment is mildly dominant. Anterior communicating artery and bilateral ACA branches are within normal limits aside from mild rightward deviation. Right MCA M1 segment, bifurcation, and right MCA branches are within normal limits. Left MCA M1 segment is patent without stenosis. Left MCA bifurcation and left MCA branches appear normal aside from medial displacement from the left subdural hematoma. No active contrast extravasation into the mixed density left subdural collection is identified. Venous sinuses: Patent on the delayed phase images. Anatomic variants: Mildly dominant left A1 segment. Delayed phase: No abnormal enhancement identified. Stable intracranial mass effect with rightward midline shift of 14 mm. Mixed density left subdural collection measures up to 23 mm in thickness at the level of the operculum. Review of the MIP images confirms the above findings IMPRESSION: 1. Intracranial CTA is negative for aneurysm or left subdural space contrast extravasation, but positive for high-grade bilateral ICA siphon stenosis related to bulky calcified plaque. 2. Other intracranial atherosclerosis without significant stenosis. 3. Mixed density left subdural hematoma measuring up to 2.3 cm in thickness and with rightward midline shift of 14 mm is stable since 12/30 1238 hours today. Electronically Signed   By: Genevie Ann M.D.   On: 03/05/2017 14:27   Ct Head Wo Contrast  Result Date: 03/06/2017 CLINICAL DATA:  Followup subdural hematoma. EXAM: CT HEAD  WITHOUT CONTRAST TECHNIQUE: Contiguous axial images were obtained from the base of the skull through the vertex without intravenous contrast. COMPARISON:  CT HEAD Mar 05, 2017 FINDINGS: BRAIN: Interval evacuation LEFT holo hemispheric subdural hematoma with 16 mm residual component, decreased in 23 mm. 10 mm LEFT-to-RIGHT midline shift, decreased from 12 mm. Partially re-expanded LEFT lateral ventricle with persistent mild RIGHT ventricular entrapment and interstitial edema. No intraparenchymal hemorrhage or acute large vascular territory infarct. Basal cisterns are patent. Similar mild LEFT uncal herniation. VASCULAR: Severe calcific atherosclerosis of the carotid siphons. SKULL: No skull fracture. Two new LEFT frontal burr holes. Overlying soft tissue swelling, subcutaneous gas and skin staples. SINUSES/ORBITS: The mastoid air-cells and included paranasal sinuses are well-aerated.The included ocular globes and orbital contents are non-suspicious. OTHER: None. IMPRESSION: Interval burr hole placement for evacuation of LEFT subdural hematoma with 16 mm residual component. Residual 10 mm LEFT-to-RIGHT midline shift with persistent RIGHT ventricular entrapment. Electronically Signed   By: Elon Alas M.D.   On: 03/06/2017 04:56    Assessment/Plan: Diagnosis: left SDH after fall s/p craniotomy 1. Does the need for close, 24 hr/day medical supervision in concert with the patient's rehab needs make it unreasonable for this patient to be served in a less intensive setting? Yes 2. Co-Morbidities requiring supervision/potential complications: DM, CKD III 3. Due to bladder management,  bowel management, safety, skin/wound care, disease management, medication administration, pain management and patient education, does the patient require 24 hr/day rehab nursing? Yes 4. Does the patient require coordinated care of a physician, rehab nurse, PT (1-2 hrs/day, 5 days/week), OT (1-2 hrs/day, 5 days/week) and SLP (1-2  hrs/day, 5 days/week) to address physical and functional deficits in the context of the above medical diagnosis(es)? Yes Addressing deficits in the following areas: balance, endurance, locomotion, strength, transferring, bowel/bladder control, bathing, dressing, feeding, grooming, toileting, cognition, language and psychosocial support 5. Can the patient actively participate in an intensive therapy program of at least 3 hrs of therapy per day at least 5 days per week? Yes 6. The potential for patient to make measurable gains while on inpatient rehab is excellent 7. Anticipated functional outcomes upon discharge from inpatient rehab are modified independent  with PT, modified independent with OT, modified independent with SLP. 8. Estimated rehab length of stay to reach the above functional goals is: 7-10 days 9. Anticipated D/C setting: Home 10. Anticipated post D/C treatments: [facility] therapy 11. Overall Rehab/Functional Prognosis: excellent  RECOMMENDATIONS: This patient's condition is appropriate for continued rehabilitative care in the following setting: CIR Patient has agreed to participate in recommended program. Yes Note that insurance prior authorization may be required for reimbursement for recommended care.  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 Pam was active and independent prior to this fall. Rehab Admissions Coordinator to follow up.  Thanks,  Meredith Staggers, MD, Mellody Drown    Cathlyn Parsons., PA-C 03/07/2017

## 2017-03-07 NOTE — Evaluation (Signed)
Occupational Therapy Evaluation Patient Details Name: Tracy Price MRN: [id number] DOB: [birth year] Today's Date: 03/07/2017    History of Present Illness Patient is a 61 y/o female who presents with speech changes and slurred speech as well as a fall. Head CT- Large left-sided extra-axial mixed attenuation collection compatible with acute/subacute SDH measuring 2.4 midline shift. s/p burr hole evacuation. PMH includes HTN, HLD, DM, CKD.   Clinical Impression   Patient is s/p burr hole evacuation surgery resulting in functional limitations due to the deficits listed below (see OT problem list). PTA was independent and living alone.  Patient will benefit from skilled OT acutely to increase independence and safety with ADLS to allow discharge CIR. Pt requires min (A) for all adls due to balance deficits and cognitive deficits.     Follow Up Recommendations  CIR;Supervision/Assistance - 24 hour    Equipment Recommendations  Other (comment) (defer to CIR)    Recommendations for Other Services Rehab consult     Precautions / Restrictions Precautions Precautions: Fall Restrictions Weight Bearing Restrictions: No      Mobility Bed Mobility Overal bed mobility: Needs Assistance Bed Mobility: Supine to Sit     Supine to sit: Min guard;HOB elevated     General bed mobility comments: Increased time to get to EOB but no assist needed.   Transfers Overall transfer level: Needs assistance Equipment used: None;Rolling walker (2 wheeled) Transfers: Sit to/from Stand Sit to Stand: Min guard         General transfer comment: MIn guard for safety to stand from EOB. Some ataxia noted in standing. Stood from toilet without RW.     Balance Overall balance assessment: Needs assistance Sitting-balance support: Feet supported;No upper extremity supported Sitting balance-Leahy Scale: Fair     Standing balance support: During functional activity Standing balance-Leahy Scale:  Fair Standing balance comment: Able to stand at sink and wash hands with Min A for balance.                            ADL either performed or assessed with clinical judgement   ADL Overall ADL's : Needs assistance/impaired     Grooming: Wash/dry hands;Wash/dry face;Oral care;Minimal assistance Grooming Details (indicate cue type and reason): pt dropping items with R hand and states "what is wrong with this hand?" pt asking "did i have a stroke"                 Toilet Transfer: Min guard;RW           Functional mobility during ADLs: Minimal assistance;Rolling walker General ADL Comments: Pt requires redirection at times and asking questions about recovery process. Pt unable to recall course of treatment since admission.      Vision         Perception     Praxis      Pertinent Vitals/Pain Pain Assessment: Faces Faces Pain Scale: Hurts little more Pain Location: head Pain Descriptors / Indicators: Headache Pain Intervention(s): Monitored during session;Repositioned;Premedicated before session     Hand Dominance Right   Extremity/Trunk Assessment Upper Extremity Assessment Upper Extremity Assessment: Generalized weakness;RUE deficits/detail RUE Deficits / Details: noted ataxic with oral care products RUE Coordination: decreased fine motor;decreased gross motor   Lower Extremity Assessment Lower Extremity Assessment: Defer to PT evaluation RLE Deficits / Details: Dysadiadochokinesia RLE during heel to shin.  RLE Sensation:  [facility])   Cervical / Trunk Assessment Cervical / Trunk Assessment: Normal   Communication  Communication Communication: Expressive difficulties   Cognition Arousal/Alertness: Awake/alert Behavior During Therapy: WFL for tasks assessed/performed Overall Cognitive Status: Impaired/Different from baseline Area of Impairment: Memory;Following commands;Problem solving                     Memory: Decreased short-term  memory Following Commands: Follows one step commands with increased time       General Comments: Some expressive aphasia noted. Sometimes goes on tangents unrelated to questions asked. Curious about what happened and wanted more info. RN to give.    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        OT Problem List: Decreased strength;Decreased activity tolerance;Impaired balance (sitting and/or standing);Decreased safety awareness;Decreased knowledge of use of DME or AE;Decreased knowledge of precautions;Decreased cognition;Impaired UE functional use      OT Treatment/Interventions: Self-care/ADL training;Therapeutic exercise;DME and/or AE instruction;Therapeutic activities;Cognitive remediation/compensation;Visual/perceptual remediation/compensation;Patient/family education;Balance training    OT Goals(Current goals can be found in the care plan section) Acute Rehab OT Goals Patient Stated Goal: to go to rehab so I can return to normal OT Goal Formulation: With patient Time For Goal Achievement: 03/21/17 Potential to Achieve Goals: Good  OT Frequency: Min 3X/week   Barriers to D/C: Decreased caregiver support          Co-evaluation PT/OT/SLP Co-Evaluation/Treatment: Yes Reason for Co-Treatment: Complexity  of the patient's impairments (multi-system involvement);Necessary to address cognition/behavior during functional activity;For patient/therapist safety;To address functional/ADL transfers PT goals addressed during session: Mobility/safety with mobility;Balance OT goals addressed during session: ADL's and self-care;Proper use of Adaptive equipment and DME;Strengthening/ROM      AM-PAC PT "6 Clicks" Daily Activity     Outcome Measure Help from another person eating meals?: A Little Help from another person taking care of personal grooming?: A Little Help from another person toileting, which includes using toliet, bedpan, or urinal?: A Little Help from another person bathing (including washing, rinsing, drying)?: A Little Help from another person to put on and taking off regular upper body clothing?: A Little Help from another person to put on and taking off regular lower body clothing?: A Little 6 Click Score: 18   End of Session Equipment Utilized During Treatment: Gait belt;Rolling walker Nurse Communication: Mobility status;Precautions  Activity Tolerance: Patient tolerated treatment well Patient left: in chair;with call bell/phone within reach;with chair alarm set  OT Visit Diagnosis: Unsteadiness on feet (R26.81)                Time: 1101-1120 OT Time Calculation (min): 19 min Charges:  OT General Charges $OT Visit: 1 Procedure OT Evaluation $OT Eval Moderate Complexity: 1 Procedure G-Codes:      Jeri Modena   OTR/L Pager: [phone] Office: [phone] .   Parke Poisson B 03/07/2017, 2:05 PM

## 2017-03-07 NOTE — Progress Notes (Signed)
Rehab Admissions Coordinator Note:  Patient was screened by Cleatrice Burke for appropriateness for an Inpatient Acute Rehab Consult per PT recommendation.  At this time, we are recommending Inpatient Rehab consult.  Cleatrice Burke 03/07/2017, 12:38 PM  I can be reached at [phone].

## 2017-03-08 ENCOUNTER — Inpatient Hospital Stay (HOSPITAL_COMMUNITY): Payer: BC Managed Care – PPO

## 2017-03-08 LAB — GLUCOSE, CAPILLARY
GLUCOSE-CAPILLARY: 147 mg/dL — AB (ref 65–99)
GLUCOSE-CAPILLARY: 176 mg/dL — AB (ref 65–99)
GLUCOSE-CAPILLARY: 276 mg/dL — AB (ref 65–99)
Glucose-Capillary: 235 mg/dL — ABNORMAL HIGH (ref 65–99)
Glucose-Capillary: 169 mg/dL — ABNORMAL HIGH (ref 65–99)

## 2017-03-08 MED ORDER — LEVETIRACETAM 500 MG PO TABS
500.0000 mg | ORAL_TABLET | Freq: Two times a day (BID) | ORAL | Status: DC
  Administered 2017-03-08 – 2017-03-10 (×5): 500 mg via ORAL
  Filled 2017-03-08 (×5): qty 1

## 2017-03-08 MED ORDER — INSULIN ASPART 100 UNIT/ML ~~LOC~~ SOLN
0.0000 [IU] | SUBCUTANEOUS | Status: DC
  Administered 2017-03-08: 4 [IU] via SUBCUTANEOUS
  Administered 2017-03-08: 3 [IU] via SUBCUTANEOUS
  Administered 2017-03-08: 7 [IU] via SUBCUTANEOUS
  Administered 2017-03-09: 3 [IU] via SUBCUTANEOUS
  Administered 2017-03-09: 4 [IU] via SUBCUTANEOUS
  Administered 2017-03-10: 3 [IU] via SUBCUTANEOUS
  Administered 2017-03-10: 4 [IU] via SUBCUTANEOUS

## 2017-03-08 NOTE — Progress Notes (Signed)
No acute events Feels well Incisions with dried blood, intact Moves all extremities well, no drift CT reviewed; stable from post-op scan TTF Dispo pending CIR acceptance

## 2017-03-09 LAB — GLUCOSE, CAPILLARY
GLUCOSE-CAPILLARY: 106 mg/dL — AB (ref 65–99)
GLUCOSE-CAPILLARY: 116 mg/dL — AB (ref 65–99)
GLUCOSE-CAPILLARY: 173 mg/dL — AB (ref 65–99)
GLUCOSE-CAPILLARY: 88 mg/dL (ref 65–99)
Glucose-Capillary: 132 mg/dL — ABNORMAL HIGH (ref 65–99)
Glucose-Capillary: 143 mg/dL — ABNORMAL HIGH (ref 65–99)
Glucose-Capillary: 98 mg/dL (ref 65–99)

## 2017-03-09 NOTE — Progress Notes (Signed)
Occupational Therapy Treatment Patient Details Name: Daralyn Bert MRN: [id number] DOB: Apr 28, 1956 Today's Date: 03/09/2017    History of present illness Patient is a 61 y/o female who presents with speech changes and slurred speech as well as a fall. Head CT- Large left-sided extra-axial mixed attenuation collection compatible with acute/subacute SDH measuring 2.4 midline shift. s/p burr hole evacuation. PMH includes HTN, HLD, DM, CKD.   OT comments  Pt making progress towards OT goals this session. Pt continues to present with Right hand weakness and decreased grip strength. This session Pt did better with built up handles during ADL and writing (red tubing provided). Pt continues to benefit from skilled OT in the acute setting and requires CIR level therapy to return to PLOF.   Follow Up Recommendations  CIR;Supervision/Assistance - 24 hour    Equipment Recommendations  Other (comment) (defer to CIR)    Recommendations for Other Services Rehab consult    Precautions / Restrictions Precautions Precautions: Fall Restrictions Weight Bearing Restrictions: No       Mobility Bed Mobility               General bed mobility comments: Pt sitting OOB in chair when OT entered room  Transfers Overall transfer level: Needs assistance Equipment used: Rolling walker (2 wheeled) Transfers: Sit to/from Stand Sit to Stand: Min guard         General transfer comment: Min guard for safety to stand from recliner. Some ataxia noted in standing. Stood from toilet without RW.     Balance Overall balance assessment: Needs assistance Sitting-balance support: Feet supported;No upper extremity supported Sitting balance-Leahy Scale: Fair     Standing balance support: During functional activity Standing balance-Leahy Scale: Fair Standing balance comment: Able to stand at sink and wash hands with Min A for balance.                            ADL either performed or assessed  with clinical judgement   ADL Overall ADL's : Needs assistance/impaired Eating/Feeding: Minimal assistance Eating/Feeding Details (indicate cue type and reason): red tubing provided to build up handles as Pt was demonstrating difficulty with grasping items Grooming: Wash/dry hands;Min guard;Cueing for sequencing;Standing Grooming Details (indicate cue type and reason): sink level                 Toilet Transfer: Min guard;RW;Ambulation;Comfort height toilet;Grab bars           Functional mobility during ADLs: Minimal assistance;Rolling walker General ADL Comments: Pt could not write with pen during session, explained benefits of building up grip Pt asking "I did not have a stroke, why is this happening?" Ot provided encouragement and support.     Vision       Perception     Praxis      Cognition Arousal/Alertness: Awake/alert Behavior During Therapy: WFL for tasks assessed/performed Overall Cognitive Status: Impaired/Different from baseline Area of Impairment: Memory;Following commands;Problem solving                     Memory: Decreased short-term memory Following Commands: Follows one step commands with increased time     Problem Solving: Slow processing;Requires verbal cues General Comments: Some expressive aphasia continues to be noted. More memory recall this session about what happened.        Exercises     Shoulder Instructions       General Comments      Pertinent  Vitals/ Pain       Pain Assessment: Faces Faces Pain Scale: Hurts a little bit Pain Location: head Pain Descriptors / Indicators: Headache Pain Intervention(s): Limited activity within patient's tolerance;Monitored during session  Home Living                                          Prior Functioning/Environment              Frequency  Min 3X/week        Progress Toward Goals  OT Goals(current goals can now be found in the care plan  section)  Progress towards OT goals: Progressing toward goals  Acute Rehab OT Goals Patient Stated Goal: to go to rehab so I can return to normal OT Goal Formulation: With patient Time For Goal Achievement: 03/21/17 Potential to Achieve Goals: Good  Plan Discharge plan remains appropriate;Frequency remains appropriate    Co-evaluation                 AM-PAC PT "6 Clicks" Daily Activity     Outcome Measure   Help from another person eating meals?: A Little Help from another person taking care of personal grooming?: A Little Help from another person toileting, which includes using toliet, bedpan, or urinal?: A Little Help from another person bathing (including washing, rinsing, drying)?: A Little Help from another person to put on and taking off regular upper body clothing?: A Little Help from another person to put on and taking off regular lower body clothing?: A Little 6 Click Score: 18    End of Session Equipment Utilized During Treatment: Gait belt;Rolling walker  OT Visit Diagnosis: Unsteadiness on feet (R26.81);Other (comment) (RUE hand with decreased grasp)   Activity Tolerance Patient tolerated treatment well   Patient Left in chair;with call bell/phone within reach;with chair alarm set   Nurse Communication Mobility status;Precautions (please assist with red tubing on handles of utensils)        Time: 6073-7106 OT Time Calculation (min): 28 min  Charges: OT General Charges $OT Visit: 1 Procedure OT Treatments $Self Care/Home Management : 23-37 mins  Hulda Humphrey OTR/L [place] 03/09/2017, 4:47 PM

## 2017-03-09 NOTE — Progress Notes (Signed)
No acute events; mild headache Incisions with dried blood, intact Moves all extremities well, no drift Continue current care Hopefully to [facility] tomorrow

## 2017-03-10 ENCOUNTER — Inpatient Hospital Stay (HOSPITAL_COMMUNITY)
Admission: RE | Admit: 2017-03-10 | Discharge: 2017-03-20 | DRG: 949 | Disposition: A | Discharge status: Home / Self Care | Payer: BC Managed Care – PPO | Source: Intra-hospital | Attending: Physical Medicine & Rehabilitation | Admitting: Physical Medicine & Rehabilitation

## 2017-03-10 DIAGNOSIS — Z9884 Bariatric surgery status: Secondary | ICD-10-CM | POA: Diagnosis not present

## 2017-03-10 DIAGNOSIS — E1169 Type 2 diabetes mellitus with other specified complication: Secondary | ICD-10-CM | POA: Diagnosis not present

## 2017-03-10 DIAGNOSIS — D72829 Elevated white blood cell count, unspecified: Secondary | ICD-10-CM

## 2017-03-10 DIAGNOSIS — I671 Cerebral aneurysm, nonruptured: Secondary | ICD-10-CM

## 2017-03-10 DIAGNOSIS — N390 Urinary tract infection, site not specified: Secondary | ICD-10-CM | POA: Diagnosis present

## 2017-03-10 DIAGNOSIS — E785 Hyperlipidemia, unspecified: Secondary | ICD-10-CM | POA: Diagnosis present

## 2017-03-10 DIAGNOSIS — F419 Anxiety disorder, unspecified: Secondary | ICD-10-CM | POA: Diagnosis present

## 2017-03-10 DIAGNOSIS — R29818 Other symptoms and signs involving the nervous system: Secondary | ICD-10-CM | POA: Diagnosis not present

## 2017-03-10 DIAGNOSIS — E46 Unspecified protein-calorie malnutrition: Secondary | ICD-10-CM | POA: Diagnosis present

## 2017-03-10 DIAGNOSIS — I129 Hypertensive chronic kidney disease with stage 1 through stage 4 chronic kidney disease, or unspecified chronic kidney disease: Secondary | ICD-10-CM | POA: Diagnosis present

## 2017-03-10 DIAGNOSIS — E1122 Type 2 diabetes mellitus with diabetic chronic kidney disease: Secondary | ICD-10-CM | POA: Diagnosis present

## 2017-03-10 DIAGNOSIS — G441 Vascular headache, not elsewhere classified: Secondary | ICD-10-CM

## 2017-03-10 DIAGNOSIS — G4733 Obstructive sleep apnea (adult) (pediatric): Secondary | ICD-10-CM | POA: Diagnosis present

## 2017-03-10 DIAGNOSIS — N183 Chronic kidney disease, stage 3 unspecified: Secondary | ICD-10-CM

## 2017-03-10 DIAGNOSIS — Z6831 Body mass index (BMI) 31.0-31.9, adult: Secondary | ICD-10-CM | POA: Diagnosis not present

## 2017-03-10 DIAGNOSIS — I10 Essential (primary) hypertension: Secondary | ICD-10-CM | POA: Diagnosis not present

## 2017-03-10 DIAGNOSIS — E8809 Other disorders of plasma-protein metabolism, not elsewhere classified: Secondary | ICD-10-CM | POA: Diagnosis present

## 2017-03-10 DIAGNOSIS — S065X9A Traumatic subdural hemorrhage with loss of consciousness of unspecified duration, initial encounter: Secondary | ICD-10-CM | POA: Diagnosis present

## 2017-03-10 DIAGNOSIS — S065X9D Traumatic subdural hemorrhage with loss of consciousness of unspecified duration, subsequent encounter: Principal | ICD-10-CM

## 2017-03-10 DIAGNOSIS — I6200 Nontraumatic subdural hemorrhage, unspecified: Secondary | ICD-10-CM

## 2017-03-10 DIAGNOSIS — Z2989 Encounter for other specified prophylactic measures: Secondary | ICD-10-CM

## 2017-03-10 DIAGNOSIS — K5900 Constipation, unspecified: Secondary | ICD-10-CM | POA: Diagnosis present

## 2017-03-10 DIAGNOSIS — D62 Acute posthemorrhagic anemia: Secondary | ICD-10-CM | POA: Diagnosis present

## 2017-03-10 DIAGNOSIS — Z79899 Other long term (current) drug therapy: Secondary | ICD-10-CM

## 2017-03-10 DIAGNOSIS — E119 Type 2 diabetes mellitus without complications: Secondary | ICD-10-CM

## 2017-03-10 DIAGNOSIS — M25511 Pain in right shoulder: Secondary | ICD-10-CM | POA: Diagnosis present

## 2017-03-10 DIAGNOSIS — H5340 Unspecified visual field defects: Secondary | ICD-10-CM | POA: Diagnosis present

## 2017-03-10 DIAGNOSIS — R260 Ataxic gait: Secondary | ICD-10-CM | POA: Diagnosis present

## 2017-03-10 DIAGNOSIS — G8929 Other chronic pain: Secondary | ICD-10-CM | POA: Diagnosis present

## 2017-03-10 DIAGNOSIS — F329 Major depressive disorder, single episode, unspecified: Secondary | ICD-10-CM

## 2017-03-10 DIAGNOSIS — R270 Ataxia, unspecified: Secondary | ICD-10-CM

## 2017-03-10 DIAGNOSIS — Z888 Allergy status to other drugs, medicaments and biological substances status: Secondary | ICD-10-CM | POA: Diagnosis not present

## 2017-03-10 DIAGNOSIS — E669 Obesity, unspecified: Secondary | ICD-10-CM | POA: Diagnosis present

## 2017-03-10 DIAGNOSIS — Z298 Encounter for other specified prophylactic measures: Secondary | ICD-10-CM

## 2017-03-10 DIAGNOSIS — S065X0S Traumatic subdural hemorrhage without loss of consciousness, sequela: Secondary | ICD-10-CM | POA: Diagnosis not present

## 2017-03-10 DIAGNOSIS — R4189 Other symptoms and signs involving cognitive functions and awareness: Secondary | ICD-10-CM | POA: Diagnosis not present

## 2017-03-10 DIAGNOSIS — S065XAA Traumatic subdural hemorrhage with loss of consciousness status unknown, initial encounter: Secondary | ICD-10-CM | POA: Diagnosis present

## 2017-03-10 LAB — GLUCOSE, CAPILLARY
Glucose-Capillary: 201 mg/dL — ABNORMAL HIGH (ref 65–99)
Glucose-Capillary: 102 mg/dL — ABNORMAL HIGH (ref 65–99)
Glucose-Capillary: 102 mg/dL — ABNORMAL HIGH (ref 65–99)
Glucose-Capillary: 130 mg/dL — ABNORMAL HIGH (ref 65–99)
Glucose-Capillary: 152 mg/dL — ABNORMAL HIGH (ref 65–99)

## 2017-03-10 LAB — URINALYSIS, ROUTINE W REFLEX MICROSCOPIC
Bilirubin Urine: NEGATIVE
GLUCOSE, UA: NEGATIVE mg/dL
KETONES UR: NEGATIVE mg/dL
Nitrite: NEGATIVE
Protein, ur: 30 mg/dL — AB
SPECIFIC GRAVITY, URINE: 1.028 (ref 1.005–1.030)
pH: 5 (ref 5.0–8.0)

## 2017-03-10 MED ORDER — SERTRALINE HCL 100 MG PO TABS
100.0000 mg | ORAL_TABLET | Freq: Every day | ORAL | Status: DC
  Administered 2017-03-11 – 2017-03-20 (×10): 100 mg via ORAL
  Filled 2017-03-10 (×10): qty 1

## 2017-03-10 MED ORDER — DIPHENHYDRAMINE HCL 12.5 MG/5ML PO ELIX: 12.5000 mg | ORAL_SOLUTION | Freq: Four times a day (QID) | ORAL | Status: DC | PRN

## 2017-03-10 MED ORDER — POLYETHYLENE GLYCOL 3350 17 G PO PACK
17.0000 g | PACK | Freq: Two times a day (BID) | ORAL | Status: DC
  Administered 2017-03-10 – 2017-03-12 (×3): 17 g via ORAL
  Filled 2017-03-10 (×18): qty 1

## 2017-03-10 MED ORDER — FLEET ENEMA 7-19 GM/118ML RE ENEM: 1.0000 | ENEMA | Freq: Once | RECTAL | Status: DC | PRN

## 2017-03-10 MED ORDER — ALPRAZOLAM 0.5 MG PO TABS
0.5000 mg | ORAL_TABLET | Freq: Four times a day (QID) | ORAL | Status: DC | PRN
  Administered 2017-03-11 – 2017-03-20 (×14): 0.5 mg via ORAL
  Filled 2017-03-10 (×9): qty 1
  Filled 2017-03-10: qty 2
  Filled 2017-03-10 (×5): qty 1

## 2017-03-10 MED ORDER — INSULIN ASPART 100 UNIT/ML ~~LOC~~ SOLN: 0.0000 [IU] | Freq: Every day | SUBCUTANEOUS | Status: DC

## 2017-03-10 MED ORDER — BISACODYL 5 MG PO TBEC
10.0000 mg | DELAYED_RELEASE_TABLET | Freq: Every day | ORAL | Status: DC | PRN
  Administered 2017-03-10: 10 mg via ORAL
  Filled 2017-03-10: qty 2

## 2017-03-10 MED ORDER — PANTOPRAZOLE SODIUM 40 MG PO TBEC
40.0000 mg | DELAYED_RELEASE_TABLET | Freq: Every day | ORAL | Status: DC
  Administered 2017-03-10 – 2017-03-19 (×10): 40 mg via ORAL
  Filled 2017-03-10 (×10): qty 1

## 2017-03-10 MED ORDER — LEVETIRACETAM 500 MG PO TABS
500.0000 mg | ORAL_TABLET | Freq: Two times a day (BID) | ORAL | Status: DC
  Administered 2017-03-10 – 2017-03-20 (×20): 500 mg via ORAL
  Filled 2017-03-10 (×21): qty 1

## 2017-03-10 MED ORDER — INSULIN ASPART 100 UNIT/ML ~~LOC~~ SOLN
0.0000 [IU] | Freq: Three times a day (TID) | SUBCUTANEOUS | Status: DC
  Administered 2017-03-11 – 2017-03-12 (×2): 2 [IU] via SUBCUTANEOUS
  Administered 2017-03-13 – 2017-03-15 (×3): 3 [IU] via SUBCUTANEOUS
  Administered 2017-03-16 – 2017-03-17 (×2): 2 [IU] via SUBCUTANEOUS
  Administered 2017-03-19: 3 [IU] via SUBCUTANEOUS

## 2017-03-10 MED ORDER — TRAMADOL HCL 50 MG PO TABS
50.0000 mg | ORAL_TABLET | Freq: Four times a day (QID) | ORAL | Status: DC | PRN
  Administered 2017-03-10 – 2017-03-12 (×5): 50 mg via ORAL
  Filled 2017-03-10 (×6): qty 1

## 2017-03-10 MED ORDER — BISACODYL 10 MG RE SUPP: 10.0000 mg | Freq: Every day | RECTAL | Status: DC | PRN

## 2017-03-10 MED ORDER — LOSARTAN POTASSIUM 50 MG PO TABS
100.0000 mg | ORAL_TABLET | Freq: Every day | ORAL | Status: DC
  Administered 2017-03-11 – 2017-03-20 (×10): 100 mg via ORAL
  Filled 2017-03-10 (×10): qty 2

## 2017-03-10 MED ORDER — PROCHLORPERAZINE 25 MG RE SUPP: 12.5000 mg | Freq: Four times a day (QID) | RECTAL | Status: DC | PRN

## 2017-03-10 MED ORDER — ALUM & MAG HYDROXIDE-SIMETH 200-200-20 MG/5ML PO SUSP: 30.0000 mL | ORAL | Status: DC | PRN

## 2017-03-10 MED ORDER — LINAGLIPTIN 5 MG PO TABS
5.0000 mg | ORAL_TABLET | Freq: Every day | ORAL | Status: DC
  Administered 2017-03-11 – 2017-03-20 (×10): 5 mg via ORAL
  Filled 2017-03-10 (×10): qty 1

## 2017-03-10 MED ORDER — ACETAMINOPHEN 500 MG PO TABS
500.0000 mg | ORAL_TABLET | Freq: Four times a day (QID) | ORAL | Status: DC | PRN
  Administered 2017-03-10: 500 mg via ORAL
  Filled 2017-03-10: qty 1

## 2017-03-10 MED ORDER — ALBUTEROL SULFATE (2.5 MG/3ML) 0.083% IN NEBU: 2.5000 mg | INHALATION_SOLUTION | RESPIRATORY_TRACT | Status: DC | PRN

## 2017-03-10 MED ORDER — GUAIFENESIN-DM 100-10 MG/5ML PO SYRP: 5.0000 mL | ORAL_SOLUTION | Freq: Four times a day (QID) | ORAL | Status: DC | PRN

## 2017-03-10 MED ORDER — ACETAMINOPHEN 500 MG PO TABS: 500.0000 mg | ORAL_TABLET | Freq: Four times a day (QID) | ORAL | Status: DC | PRN

## 2017-03-10 MED ORDER — TRAZODONE HCL 50 MG PO TABS
25.0000 mg | ORAL_TABLET | Freq: Every evening | ORAL | Status: DC | PRN
  Filled 2017-03-10: qty 1

## 2017-03-10 MED ORDER — PROCHLORPERAZINE EDISYLATE 5 MG/ML IJ SOLN
5.0000 mg | Freq: Four times a day (QID) | INTRAMUSCULAR | Status: DC | PRN
Start: 2017-03-10 — End: 2017-03-20

## 2017-03-10 MED ORDER — PROCHLORPERAZINE MALEATE 5 MG PO TABS: 5.0000 mg | ORAL_TABLET | Freq: Four times a day (QID) | ORAL | Status: DC | PRN

## 2017-03-10 MED ORDER — ACETAMINOPHEN 325 MG PO TABS
325.0000 mg | ORAL_TABLET | ORAL | Status: DC | PRN
  Administered 2017-03-10 – 2017-03-19 (×6): 650 mg via ORAL
  Filled 2017-03-10 (×6): qty 2

## 2017-03-10 NOTE — Discharge Summary (Signed)
  Physician Discharge Summary  Patient ID: Tracy Price MRN: [id number] DOB/AGE: 1956-01-03 61 y.o.  Admit date: 03/05/2017 Discharge date: 03/10/2017  Admission Diagnoses:Subacute subdural hematoma  Discharge Diagnoses: Same Active Problems:   SDH (subdural hematoma) (HCC)   Cerebral aneurysm   Discharged Condition: good  Hospital Course: Patient is medical emergency room with altered mental status revealed large left-sided subacute subdural hematoma post patient presented to the emergency room with altered mental status workup revealed a large subacute subdural hematoma. Patient was emergently taken the OR underwent burr hole craniectomy for evacuation of a large subacute subdural hematoma. Postoperatively patient did very well went to the recovery room then the ICU in the ICU patient was observed for 48 hours been transferred to the floor. On the floor patient's ambulating and voiding displayed neurologic improvement and is been stable for transfer to rehabilitation.  Consults: Significant Diagnostic Studies: Treatments: Burr hole craniectomy left burr hole craniectomy left burr hole Discharge Exam: Blood pressure (!) 124/57, pulse 79, temperature 98.3 F (36.8 C), temperature source Oral, resp. rate 18, height 5' (1.524 m), weight 72.3 kg (159 lb 6.3 oz), SpO2 100 %. Strength 5 out of 5 wound clean dry and intact  Disposition: Inpatient rehabilitation   Allergies as of 03/10/2017      Reactions   Crestor [rosuvastatin Calcium] Other (See Comments)   Mylgia   Lipitor [atorvastatin] Other (See Comments)   Mylagia      Medication List    TAKE these medications   atorvastatin 20 MG tablet Commonly known as:  LIPITOR Take 1 tablet (20 mg total) by mouth daily.   Empagliflozin-Linagliptin 25-5 MG Tabs Commonly known as:  GLYXAMBI Take 1 tablet by mouth daily.   ibuprofen 200 MG tablet Commonly known as:  ADVIL,MOTRIN Take 400 mg by mouth at bedtime as needed for pain.    losartan 100 MG tablet Commonly known as:  COZAAR Take 1 tablet (100 mg total) by mouth daily.   Melatonin 1 MG Tabs Take 1 mg by mouth at bedtime as needed.   sertraline 100 MG tablet Commonly known as:  ZOLOFT TAKE 1 TABLET BY MOUTH EVERY DAY   traMADol 50 MG tablet Commonly known as:  ULTRAM TAKE 1 TABLET BY MOUTH EVERY 6 HOURS AS NEEDED FOR PAIN        Signed: Quamaine Webb P 03/10/2017, 7:37 AM

## 2017-03-10 NOTE — Progress Notes (Signed)
Patient rested comfortably though out the night not complaining of pain, will continue to monitor.

## 2017-03-10 NOTE — Care Management Note (Signed)
Case Management Note  Patient Details  Name: Tracy Price MRN: [id number] Date of Birth: 04/16/1956  Subjective/Objective:                    Action/Plan: Pt discharging to CIR today. No further needs per CM.  Expected Discharge Date:  03/10/17               Expected Discharge Plan:  [place]  In-House Referral:     Discharge planning Services  CM Consult  Post Acute Care Choice:    Choice offered to:     DME Arranged:    DME Agency:     HH Arranged:    HH Agency:     Status of Service:  Completed, signed off  If discussed at [employer], dates discussed:    Additional Comments:  Pollie Friar, RN 03/10/2017, 3:24 PM

## 2017-03-10 NOTE — Progress Notes (Signed)
Pt. Still with no BM; offered other options, pt. Does NOT want to take anything but pills for this.   Report called to [facility] on 4W.

## 2017-03-10 NOTE — PMR Pre-admission (Signed)
PMR Admission Coordinator Pre-Admission Assessment  Patient: Tracy Price is an 61 y.o., female MRN: [id number] DOB: 03-22-1956 Height: 5' (152.4 cm) Weight: 72.3 kg (159 lb 6.3 oz)              Insurance Information HMO:    PPO:       PCP:       IPA:       80/20:       OTHER:  Group # [id number] PRIMARY:  BCBS state health plan      Policy#: [id number]      Subscriber:  Jennelle Human CM Name: Philis Fendt      Phone#:  [phone]     Fax#:  [phone] Pre-Cert#:  [id number]      Employer:  Retired Benefits:  Phone #: [phone]     Name:  Hanley Seamen. Date: 10/07/16     Deduct:  $1250 (met $1250)      Out of Pocket Max:  [phone] (met $0)      Life Max:  unlimited CIR: 80% with $450 copay per admission      SNF:  805 with 100 days max Outpatient:  Medical necessity     Co-Pay:  $52/visit Home Health: 80%      Co-Pay: 20% DME: 80%     Co-Pay: 20% Providers: in network  Medicaid Application Date:        Case Manager:   Disability Application Date:        Case Worker:    Emergency Facilities manager Information    Name Relation Home Work Mobile   [place] Brother [phone]       Current Medical History  Patient Admitting Diagnosis: Left SDH after fall s/p craniotomy   History of Present Illness: A 61 y.o.right handed femalewith history of CKD stage III, diabetes mellitus, hypertension, hyperlipidemia with reports of 2 weeks history of waxing and waning of severe headaches and question of fall prior to onset. Brother reported issues with confusion, slurred speech and unsteady gait with decline. 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 She underwent left-sided burr hole craniectomy for evacuation of SDH by Dr. Saintclair Halsted. Has been treated with Decadron and on Keppra for seizure prophylaxis. Patient with resultant ataxic gait, expressive deficits, and right sided weakness affecting mobility as well as  ability to carry out ADL tasks. CIR recommended for follow up therapy.     Total: 1=NIH  Past Medical History  Past Medical History:  Diagnosis Date  . Chest pain   . CKD (chronic kidney disease) stage 3, GFR 30-59 ml/min   . Diabetes mellitus without complication ([place])    type 2  . Hyperlipidemia   . Hypertension   . Sleep apnea 1995   had surgery to correct    Family History  family history includes Diabetes (age of onset: 62) in her sister; Heart disease in her brother and mother.  Prior Rehab/Hospitalizations:  Has the patient had major surgery during 100 days prior to admission? No  Current Medications   Current Facility-Administered Medications:  .  acetaminophen (TYLENOL) tablet 500 mg, 500 mg, Oral, Q6H PRN, Kary Kos, MD, 500 mg at 03/10/17 1046 .  albuterol (PROVENTIL) (2.5 MG/3ML) 0.083% nebulizer solution 2.5 mg, 2.5 mg, Nebulization, Once, Kary Kos, MD .  ALPRAZolam Duanne Moron) tablet 0.5 mg, 0.5 mg, Oral, Q6H PRN, Kary Kos, MD, 0.5 mg at 03/06/17 0800 .  atorvastatin (LIPITOR) tablet 20 mg,  20 mg, Oral, q1800, Kary Kos, MD .  bisacodyl (DULCOLAX) EC tablet 10 mg, 10 mg, Oral, Daily PRN, Kary Kos, MD, 10 mg at 03/10/17 0803 .  docusate sodium (COLACE) capsule 100 mg, 100 mg, Oral, BID, Kary Kos, MD, 100 mg at 03/10/17 1046 .  HYDROmorphone (DILAUDID) injection 0.5 mg, 0.5 mg, Intravenous, Q2H PRN, Kary Kos, MD, 0.5 mg at 03/08/17 1122 .  insulin aspart (novoLOG) injection 0-20 Units, 0-20 Units, Subcutaneous, Q4H, Ditty, Kevan Ny, MD, 3 Units at 03/10/17 1218 .  labetalol (NORMODYNE,TRANDATE) injection 10-40 mg, 10-40 mg, Intravenous, Q10 min PRN, Kary Kos, MD .  levETIRAcetam (KEPPRA) tablet 500 mg, 500 mg, Oral, BID, Ditty, Kevan Ny, MD, 500 mg at 03/10/17 1047 .  linagliptin (TRADJENTA) tablet 5 mg, 5 mg, Oral, Daily, Kary Kos, MD, 5 mg at 03/10/17 1046 .  losartan (COZAAR) tablet 100 mg, 100 mg, Oral, Daily, Kary Kos, MD, 100 mg at  03/10/17 1046 .  menthol-cetylpyridinium (CEPACOL) lozenge 3 mg, 1 lozenge, Oral, PRN, Meyran, Ocie Cornfield, NP .  ondansetron (ZOFRAN) tablet 4 mg, 4 mg, Oral, Q4H PRN **OR** ondansetron (ZOFRAN) injection 4 mg, 4 mg, Intravenous, Q4H PRN, Kary Kos, MD .  pantoprazole (PROTONIX) EC tablet 40 mg, 40 mg, Oral, QHS, Rumbarger, Valeda Malm, RPH, 40 mg at 03/09/17 2050 .  promethazine (PHENERGAN) tablet 12.5-25 mg, 12.5-25 mg, Oral, Q4H PRN, Kary Kos, MD .  sertraline (ZOLOFT) tablet 100 mg, 100 mg, Oral, Daily, Kary Kos, MD, 100 mg at 03/10/17 1047 .  traMADol (ULTRAM) tablet 50 mg, 50 mg, Oral, Q6H PRN, Kary Kos, MD, 50 mg at 03/10/17 1440  Patients Current Diet: Diet Carb Modified Fluid consistency: Thin; Room service appropriate? Yes  Precautions / Restrictions Precautions Precautions: Fall Restrictions Weight Bearing Restrictions: No   Has the patient had 2 or more falls or a fall with injury in the past year?YesPatient reports 2 falls in the past year.  Prior Activity Level Community (5-7x/wk): Went out daily.  Was driving.  Home Assistive Devices / Equipment Home Assistive Devices/Equipment: None Home Equipment: None  Prior Device Use: Indicate devices/aids used by the patient prior to current illness, exacerbation or injury? 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  Self Care: Did the patient need help bathing, dressing, using the toilet or eating?  Independent  Indoor Mobility: Did the patient need assistance with walking from room to room (with or without device)? Independent  Stairs: Did the patient need assistance with internal or external stairs (with or without device)? Independent  Functional Cognition: Did the patient need help planning regular tasks such as shopping or remembering to take medications?  Independent  Current Functional Level Cognition  Overall Cognitive Status: Impaired/Different from baseline Orientation Level: Oriented X4 Following Commands: Follows one step commands with increased time General Comments: Some expressive aphasia continues to be noted. More memory recall this session about what happened.    Extremity Assessment (includes Sensation/Coordination)  Upper Extremity Assessment: Generalized weakness, RUE deficits/detail RUE Deficits / Details: noted ataxic with oral care products RUE Coordination: decreased fine motor, decreased gross motor  Lower Extremity Assessment: Defer to PT evaluation RLE Deficits / Details: Dysadiadochokinesia RLE during heel to shin.  RLE Sensation:  [facility])    ADLs  Overall ADL's : Needs assistance/impaired Eating/Feeding: Minimal assistance Eating/Feeding Details (indicate cue type and reason): red tubing provided to  build up handles as Pt was demonstrating difficulty with grasping items Grooming: Wash/dry hands, Min guard, Cueing for sequencing, Standing Grooming Details (indicate cue type and reason): sink level Toilet Transfer: Min guard, RW, Ambulation, Comfort height toilet, Grab bars Functional mobility during ADLs: Minimal assistance, Rolling walker General ADL Comments: Pt could not write with pen during session, explained benefits of building up grip Pt asking "I did not have a stroke, why is this happening?" Ot provided encouragement and support.    Mobility  Overal bed mobility: Needs Assistance Bed Mobility: Supine to Sit Supine to sit: Min guard, HOB elevated General bed mobility comments: increased time and effort, min guard for safety    Transfers  Overall transfer level: Needs assistance Equipment used: 1 person hand held assist Transfers: Sit to/from Stand Sit to Stand: Min guard General transfer comment: Min guard for safety to stand from recliner. Some ataxia noted in standing. Stood from toilet without  RW.     Ambulation / Gait / Stairs / Wheelchair Mobility  Ambulation/Gait Ambulation/Gait assistance: Min assist (ocassional moderate assist for instability during retraining) Ambulation Distance (Feet): 50 Feet (x2) Assistive device: 1 person hand held assist Gait Pattern/deviations: Step-through pattern, Decreased stride length, Ataxic General Gait Details: Focus of gait retraining with multi modal faciliatation cues to assist patient in weight shift and stride production. Patient given visual cues for upright posture and gaze stability. Patient continues to show deviations in gait but demonstrates improvements with re-education and carry over of external cues. Gait velocity: decreased Gait velocity interpretation: Below normal speed for age/gender    Posture / Balance Dynamic Sitting Balance Sitting balance - Comments: improved ability to reach outside BOS in sitting Balance Overall balance assessment: Needs assistance Sitting-balance support: Feet supported, No upper extremity supported Sitting balance-Leahy Scale: Good Sitting balance - Comments: improved ability to reach outside BOS in sitting Standing balance support: During functional activity Standing balance-Leahy Scale: Fair Standing balance comment: can static stand without UE support    Special needs/care consideration BiPAP/CPAP No CPM No Continuous Drip IV No Dialysis No       Life Vest No Oxygen No Special Bed No Trach Size No Wound Vac (area) No     Skin Left scalp with staples, right arm wound                           Bowel mgmt: No BM since 03/04/17 Bladder mgmt: Voiding with urgency and frequency Diabetic mgmt Yes, on oral medication at home.    Previous Home Environment Living Arrangements: Alone Available Help at Discharge: Family Type of Home: House Home Layout: One level Home Access: Stairs to enter Entrance Stairs-Rails: Right, Left, Can reach both Entrance Stairs-Number of Steps: 6 Bathroom  Shower/Tub: Chiropodist: Standard Home Care Services: No Additional Comments: pt has (A) from brother and niece PRN   Discharge Living Setting Plans for Discharge Living Setting: Patient's home, Alone, House (Lives alone.) Type of Home at Discharge: House Discharge Home Layout: One level Discharge Home Access: Stairs to enter Entrance Stairs-Number of Steps: 6 Does the patient have any problems obtaining your medications?: No  Social/Family/Support Systems Patient Roles: Other (Comment) (Has a brother.) Contact Information: Shawnna Pancake - brother - [phone] Anticipated Caregiver: self Ability/Limitations of Caregiver: Brother is retired, not working, brother lives locally. Caregiver Availability: Intermittent (Has mod I goals) Discharge Plan Discussed with Primary Caregiver: Yes Is Caregiver In Agreement with Plan?: Yes Does Caregiver/Family  have Issues with Lodging/Transportation while Pt is in Rehab?: No  Goals/Additional Needs Patient/Family Goal for Rehab: PT/OT/SLP mod I goals Expected length of stay: 7-10 days Cultural Considerations: [employer] Dietary Needs: Carb mod, med calorie, thin liquids Equipment Needs: TBD Pt/Family Agrees to Admission and willing to participate: Yes Program Orientation Provided & Reviewed with Pt/Caregiver Including Roles  & Responsibilities: Yes  Decrease burden of Care through IP rehab admission: N/A  Possible need for SNF placement upon discharge: Not planned  Patient Condition: This patient's medical and functional status has changed since the consult dated: 03/07/17 in which the Rehabilitation Physician determined and documented that the patient's condition is appropriate for intensive rehabilitative care in an inpatient rehabilitation facility. See "History of Present Illness" (above) for medical update. Functional changes are:  Currently requiring min assist to ambulate 50 feet (x@) +1 HHA. Patient's medical and  functional status update has been discussed with the Rehabilitation physician and patient remains appropriate for inpatient rehabilitation. Will admit to inpatient rehab today.  Preadmission Screen Completed By:  Retta Diones, 03/10/2017 3:04 PM ______________________________________________________________________   Discussed status with Dr. Posey Pronto on 03/10/17 at 1503 and received telephone approval for admission today.  Admission Coordinator:  Retta Diones, time 1503/Date 03/10/17

## 2017-03-10 NOTE — Anesthesia Postprocedure Evaluation (Signed)
Anesthesia Post Note  Patient: Tracy Price  Procedure(s) Performed: Procedure(s) (LRB): LEFT BURR HOLE  HEMATOMA EVACUATION SUBDURAL (Left)     Anesthesia Post Evaluation  Last Vitals:  Vitals:   03/10/17 0436 03/10/17 1011  BP: (!) 124/57 120/68  Pulse: 79 80  Resp: 18 18  Temp: 36.8 C 36.7 C    Last Pain:  Vitals:   03/10/17 1146  TempSrc:   PainSc: 2                  Leslea Vowles S

## 2017-03-10 NOTE — H&P (Addendum)
Physical Medicine and Rehabilitation Admission H&P    Chief Complaint  Patient presents with  . Left subdural hemorrhage.     HPI:   Tracy Price a 61 y.o.right handed femalewith history of CKD stage III, diabetes mellitus, hypertension, hyperlipidemia with reports of 2 weeks history of waxing and waning of severe headaches and question of fall prior to onset. History taken from chart review and patient. Brother reported issues with confusion, slurred speech and unsteady gait with decline. She presented to ED 03/05/17 for evaluation and  CT ordered, reviewed, showing SDH.  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 She underwent left-sided burr hole craniectomy for evacuation of SDH by Dr. Saintclair Halsted. Has been treated with Decadron and on Keppra for seizure prophylaxis. Patient with resultant ataxic gait, expressive deficits, left visual field deficits and right sided weakness affecting mobility as well as ability to carry out ADL tasks. CIR recommended for follow up therapy.     Review of Systems  HENT: Negative for hearing loss.   Eyes: Positive for blurred vision. Negative for double vision.  Respiratory: Negative for cough, sputum production and shortness of breath.   Cardiovascular: Negative for chest pain and palpitations.  Gastrointestinal: Positive for constipation. Negative for abdominal pain, heartburn and vomiting.  Genitourinary: Negative for dysuria and urgency.  Musculoskeletal: Positive for joint pain (right shoulder pain due to inury.).  Skin: Negative for itching and rash.  Neurological: Positive for focal weakness and headaches. Negative for tremors and sensory change.  Psychiatric/Behavioral: Negative for depression. The patient does not have insomnia.   All other systems reviewed and are negative.     Past Medical History:  Diagnosis Date  . Chest pain   . CKD (chronic kidney disease) stage 3, GFR 30-59 ml/min   . Diabetes mellitus without  complication ([place])    type 2  . Hyperlipidemia   . Hypertension   . Sleep apnea 1995   had surgery to correct    Past Surgical History:  Procedure Laterality Date  . CRANIOTOMY Left 03/05/2017   Procedure: LEFT BURR HOLE  HEMATOMA EVACUATION SUBDURAL;  Surgeon: Kary Kos, MD;  Location: [facility];  Service: Neurosurgery;  Laterality: Left;  Marland Kitchen GASTRIC BYPASS  2005    Family History  Problem Relation Age of Onset  . Heart disease Mother   . Diabetes Sister 64       heart attack  . Heart disease Brother     Social History:  Lives alone and independent PTA. Retired Scientist, product/process development. Retired 10 years ago and has been the caregiver of her disabled mother (who had multiple rehab admissions) she reports that she has never smoked. She has never used smokeless tobacco. She reports that she does not drink alcohol or use drugs.     Allergies  Allergen Reactions  . Crestor [Rosuvastatin Calcium] Other (See Comments)    Mylgia  . Lipitor [Atorvastatin] Other (See Comments)    Mylagia    Medications Prior to Admission  Medication Sig Dispense Refill  . Empagliflozin-Linagliptin (GLYXAMBI) 25-5 MG TABS Take 1 tablet by mouth daily. 90 tablet 3  . ibuprofen (ADVIL,MOTRIN) 200 MG tablet Take 400 mg by mouth at bedtime as needed for pain.    Marland Kitchen losartan (COZAAR) 100 MG tablet Take 1 tablet (100 mg total) by mouth daily. 90 tablet 3  . Melatonin 1 MG TABS Take 1 mg by mouth at bedtime as needed.     . sertraline (ZOLOFT) 100 MG  tablet TAKE 1 TABLET BY MOUTH EVERY DAY 90 tablet 3  . traMADol (ULTRAM) 50 MG tablet TAKE 1 TABLET BY MOUTH EVERY 6 HOURS AS NEEDED FOR PAIN 180 tablet 0  . atorvastatin (LIPITOR) 20 MG tablet Take 1 tablet (20 mg total) by mouth daily. (Patient not taking: Reported on 03/05/2017) 90 tablet 3    Home: Home Living Family/patient expects to be discharged to:: Private residence Living Arrangements: Alone Available Help at Discharge: Family Type of Home: House Home  Access: Stairs to enter Technical brewer of Steps: 6 Entrance Stairs-Rails: Right, Left, Can reach both Home Layout: One level Bathroom Shower/Tub: Chiropodist: Standard Home Equipment: None Additional Comments: pt has (A) from brother and niece PRN    Functional History: Prior Function Level of Independence: Independent 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  Functional Status:  Mobility: Bed Mobility Overal bed mobility: Needs Assistance Bed Mobility: Supine to Sit Supine to sit: Min guard, HOB elevated General bed mobility comments: increased time and effort, min guard for safety Transfers Overall transfer level: Needs assistance Equipment used: 1 person hand held assist Transfers: Sit to/from Stand Sit to Stand: Min guard General transfer comment: Min guard for safety to stand from recliner. Some ataxia noted in standing. Stood from toilet without RW.  Ambulation/Gait Ambulation/Gait assistance: Min assist (ocassional moderate assist for instability during retraining) Ambulation Distance (Feet): 50 Feet (x2) Assistive device: 1 person hand held assist Gait Pattern/deviations: Step-through pattern, Decreased stride length, Ataxic General Gait Details: Focus of gait retraining with multi modal faciliatation cues to assist patient in weight shift and stride production. Patient given visual cues for upright posture and gaze stability. Patient continues to show deviations in gait but demonstrates improvements with re-education and carry over of external cues. Gait velocity: decreased Gait velocity interpretation: Below normal speed for age/gender    ADL: ADL Overall ADL's : Needs assistance/impaired Eating/Feeding: Minimal assistance Eating/Feeding Details (indicate cue type and reason): red tubing provided to build up handles as Pt was demonstrating difficulty with grasping  items Grooming: Wash/dry hands, Min guard, Cueing for sequencing, Standing Grooming Details (indicate cue type and reason): sink level Toilet Transfer: Min guard, RW, Ambulation, Comfort height toilet, Grab bars Functional mobility during ADLs: Minimal assistance, Rolling walker General ADL Comments: Pt could not write with pen during session, explained benefits of building up grip Pt asking "I did not have a stroke, why is this happening?" Ot provided encouragement and support.  Cognition: Cognition Overall Cognitive Status: Impaired/Different from baseline Orientation Level: Oriented X4 Cognition Arousal/Alertness: Awake/alert Behavior During Therapy: WFL for tasks assessed/performed Overall Cognitive Status: Impaired/Different from baseline Area of Impairment: Memory, Following commands, Problem solving Memory: Decreased short-term memory Following Commands: Follows one step commands with increased time Problem Solving: Slow processing, Requires verbal cues General Comments: Some expressive aphasia continues to be noted. More memory recall this session about what happened.    Blood pressure 120/68, pulse 80, temperature 98 F (36.7 C), temperature source Oral, resp. rate 18, height 5' (1.524 m), weight 72.3 kg (159 lb 6.3 oz), SpO2 100 %. Physical Exam  Nursing note and vitals reviewed. Constitutional: She is oriented to person, place, and time. She appears well-developed and well-nourished.  HENT:  Head: Normocephalic and atraumatic.  Mouth/Throat: Oropharynx is clear and moist.  Left crani incisions with staples in place and dry crusted blood.   Eyes: Conjunctivae and EOM are normal. Pupils  are equal, round, and reactive to light.  Neck: Normal range of motion. Neck supple.  Cardiovascular: Normal rate and regular rhythm.   No murmur heard. Respiratory: Effort normal and breath sounds normal. Stridor present. No respiratory distress. She has no wheezes.  GI: Soft. Bowel  sounds are normal. She exhibits no distension. There is no tenderness.  Musculoskeletal: She exhibits no edema or tenderness.  Resolving ecchymosis RUE/RLE  Neurological: She is alert and oriented to person, place, and time.  Speech clear.  Able to follow one and two step commands without difficulty.  Motor: RUE: 4+/5 proximal to distal RLE: 4-/5 proximal to distal LUE/LLE: 5/5 proximal to distal  Skin: Skin is warm and dry.  Psychiatric: She has a normal mood and affect. Her behavior is normal. Thought content normal.   Results for orders placed or performed during the hospital encounter of 03/05/17 (from the past 48 hour(s))  Glucose, capillary     Status: Abnormal   Collection Time: 03/08/17  4:55 PM  Result Value Ref Range   Glucose-Capillary 147 (H) 65 - 99 mg/dL   Comment 1 Notify RN    Comment 2 Document in Chart   Glucose, capillary     Status: Abnormal   Collection Time: 03/08/17  8:26 PM  Result Value Ref Range   Glucose-Capillary 169 (H) 65 - 99 mg/dL   Comment 1 Notify RN    Comment 2 Document in Chart   Glucose, capillary     Status: None   Collection Time: 03/09/17 12:24 AM  Result Value Ref Range   Glucose-Capillary 98 65 - 99 mg/dL   Comment 1 Notify RN    Comment 2 Document in Chart   Glucose, capillary     Status: Abnormal   Collection Time: 03/09/17  4:00 AM  Result Value Ref Range   Glucose-Capillary 106 (H) 65 - 99 mg/dL   Comment 1 Notify RN    Comment 2 Document in Chart   Glucose, capillary     Status: Abnormal   Collection Time: 03/09/17  8:15 AM  Result Value Ref Range   Glucose-Capillary 132 (H) 65 - 99 mg/dL   Comment 1 Notify RN    Comment 2 Document in Chart   Glucose, capillary     Status: Abnormal   Collection Time: 03/09/17 11:47 AM  Result Value Ref Range   Glucose-Capillary 116 (H) 65 - 99 mg/dL   Comment 1 Notify RN    Comment 2 Document in Chart   Glucose, capillary     Status: Abnormal   Collection Time: 03/09/17  5:08 PM    Result Value Ref Range   Glucose-Capillary 143 (H) 65 - 99 mg/dL   Comment 1 Notify RN    Comment 2 Document in Chart   Glucose, capillary     Status: Abnormal   Collection Time: 03/09/17  7:58 PM  Result Value Ref Range   Glucose-Capillary 173 (H) 65 - 99 mg/dL   Comment 1 Notify RN    Comment 2 Document in Chart   Glucose, capillary     Status: None   Collection Time: 03/09/17 11:30 PM  Result Value Ref Range   Glucose-Capillary 88 65 - 99 mg/dL   Comment 1 Notify RN    Comment 2 Document in Chart   Glucose, capillary     Status: Abnormal   Collection Time: 03/10/17  3:02 AM  Result Value Ref Range   Glucose-Capillary 102 (H) 65 - 99 mg/dL  Comment 1 Notify RN    Comment 2 Document in Chart   Glucose, capillary     Status: Abnormal   Collection Time: 03/10/17  7:15 AM  Result Value Ref Range   Glucose-Capillary 102 (H) 65 - 99 mg/dL  Glucose, capillary     Status: Abnormal   Collection Time: 03/10/17 11:45 AM  Result Value Ref Range   Glucose-Capillary 130 (H) 65 - 99 mg/dL   No results found.     Medical Problem List and Plan: 1.  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  2.  DVT Prophylaxis/Anticoagulation: Mechanical: Sequential compression devices, below knee Bilateral lower extremities 3. Headaches/Pain Management: tramadol prn 4. Mood: LCSW to follow for evaluation and support.  5. Neuropsych: This patient is capable of making decisions on her own behalf. 6. Skin/Wound Care: routine pressure relief measures. Maintain adequate nutritional and hydration status.  7. Fluids/Electrolytes/Nutrition: Monitor I/O. Check lytes in am.  8. HTN: Monitor BP bid. Continue cozaar 9. ABLA: Monitor serially. Recheck in am.  10. CKD stage 3: Encourage fluid intake. Recheck lytes in am. 11. Leucocytosis: Question due to steroids and/or reactive.  Monitor for  signs of infection.  12. T2DM: Hgb A1c- 6.5. Monitor BS ac/hs. On Trajenta. Use SSI for elevate BS.  13. OSA: Encourage use of CPAP.  7. H/o depression: On Zoloft.  15. Constipation: Augment bowel regimen.  16. Seizure prophylaxis: On keppra   Post Admission Physician Evaluation: 1. Preadmission assessment reviewed and changes made below. 2. Functional deficits secondary  to SDH. 3. Patient is admitted to receive collaborative, interdisciplinary care between the physiatrist, rehab nursing staff, and therapy team. 4. Patient's level of medical complexity and substantial therapy needs in context of that medical necessity cannot be provided at a lesser intensity of care such as a SNF. 5. Patient has experienced substantial functional loss from his/her baseline which was documented above under the "Functional History" and "Functional Status" headings.  Judging by the patient's diagnosis, physical exam, and functional history, the patient has potential for functional progress which will result in measurable gains while on inpatient rehab.  These gains will be of substantial and practical use upon discharge  in facilitating mobility and self-care at the household level. 36. Physiatrist will provide 24 hour management of medical needs as well as oversight of the therapy plan/treatment and provide guidance as appropriate regarding the interaction of the two. 7. 24 hour rehab nursing will assist with safety, skin/wound care, disease management, pain management and patient education  and help integrate therapy concepts, techniques,education, etc. 8. PT will assess and treat for/with: Lower extremity strength, range of motion, stamina, balance, functional mobility, safety, adaptive techniques and equipment, woundcare, coping skills, pain control, education.   Goals are: Mod I. 9. OT will assess and treat for/with: ADL's, functional mobility, safety, upper extremity strength, adaptive techniques and equipment,  wound mgt, ego support, and community reintegration.   Goals are: Mod I. Therapy may not proceed with showering this patient. 10. Case Management and Social Worker will assess and treat for psychological issues and discharge planning. 11. Team conference will be held weekly to assess progress toward goals and to determine barriers to discharge. 12. Patient will receive at least 3 hours of therapy per day at least 5 days per week. 13. ELOS: 7-12 days.        14. Prognosis:  good  Delice Lesch, MD, Mellody Drown  [place], [place], [place] 03/10/2017

## 2017-03-10 NOTE — Progress Notes (Signed)
CSW consulted for possible CIR back-up, pending insurance authorization. CSW alerted from [facility] that patient will not be interested in pursuing SNF as back-up to CIR; patient will go home with brother and set up home health services instead.  CSW not needed to facilitate discharge to SNF. CSW signing off.  Laveda Abbe LCSW [phone]

## 2017-03-10 NOTE — Progress Notes (Signed)
Rehab admissions - I met with patient this am.  She would like to admit to inpatient rehab.  I have called and opened the case with state [employer] requesting acute inpatient rehab admission.  I will update all later today when I hear back from insurance case manager.  I do have rehab bed available today if we get authorization.  Call me for questions.  #[phone]

## 2017-03-10 NOTE — Progress Notes (Signed)
Physical Therapy Treatment Patient Details Name: Tracy Price MRN: [id number] DOB: 1956/02/06 Today's Date: 03/10/2017    History of Present Illness Patient is a 61 y/o female who presents with speech changes and slurred speech as well as a fall. Head CT- Large left-sided extra-axial mixed attenuation collection compatible with acute/subacute SDH measuring 2.4 midline shift. s/p burr hole evacuation. PMH includes HTN, HLD, DM, CKD.    PT Comments    Patient eager for therapy. Tolerated session well. Focus of today's session on gait retraining with multi modal faciliatation cues to assist patient in weight shift and stride production. Patient given visual cues for upright posture and gaze stability. Patient continues to show deviations in gait but demonstrates improvements with re-education and carry over of external cues. Given prior level of function, and patients commitment to carry out therapy feel patient would make significant gains with comprehensive therapies to maximize recovery for patient to return to independence. Continue to recommend CIR.  Follow Up Recommendations  CIR;Supervision for mobility/OOB;Supervision/Assistance - 24 hour     Equipment Recommendations  Other (comment) (TBA)    Recommendations for Other Services Rehab consult;Speech consult     Precautions / Restrictions Precautions Precautions: Fall Restrictions Weight Bearing Restrictions: No    Mobility  Bed Mobility Overal bed mobility: Needs Assistance Bed Mobility: Supine to Sit     Supine to sit: Min guard;HOB elevated     General bed mobility comments: increased time and effort, min guard for safety  Transfers Overall transfer level: Needs assistance Equipment used: 1 person hand held assist Transfers: Sit to/from Stand Sit to Stand: Min guard         General transfer comment: Min guard for safety to stand from recliner. Some ataxia noted in standing. Stood from toilet without RW.    Ambulation/Gait Ambulation/Gait assistance: Min assist (ocassional moderate assist for instability during retraining) Ambulation Distance (Feet): 50 Feet (x2) Assistive device: 1 person hand held assist Gait Pattern/deviations: Step-through pattern;Decreased stride length;Ataxic Gait velocity: decreased Gait velocity interpretation: Below normal speed for age/gender General Gait Details: Focus of gait retraining with multi modal faciliatation cues to assist patient in weight shift and stride production. Patient given visual cues for upright posture and gaze stability. Patient continues to show deviations in gait but demonstrates improvements with re-education and carry over of external cues.   Stairs            Wheelchair Mobility    Modified Rankin (Stroke Patients Only) Modified Rankin (Stroke Patients Only) Pre-Morbid Rankin Score: No symptoms Modified Rankin: Moderately severe disability     Balance Overall balance assessment: Needs assistance Sitting-balance support: Feet supported;No upper extremity supported Sitting balance-Leahy Scale: Good Sitting balance - Comments: improved ability to reach outside BOS in sitting   Standing balance support: During functional activity Standing balance-Leahy Scale: Fair Standing balance comment: can static stand without UE support                            Cognition Arousal/Alertness: Awake/alert Behavior During Therapy: WFL for tasks assessed/performed Overall Cognitive Status: Impaired/Different from baseline Area of Impairment: Memory;Following commands;Problem solving                     Memory: Decreased short-term memory Following Commands: Follows one step commands with increased time     Problem Solving: Slow processing;Requires verbal cues General Comments: Some expressive aphasia continues to be noted. More memory recall this session about  what happened.      Exercises      General  Comments        Pertinent Vitals/Pain Pain Assessment: Faces Faces Pain Scale: Hurts even more Pain Location: head Pain Descriptors / Indicators: Headache Pain Intervention(s): Monitored during session    Home Living                      Prior Function            PT Goals (current goals can now be found in the care plan section) Acute Rehab PT Goals Patient Stated Goal: to go to rehab so I can return to normal PT Goal Formulation: With patient Time For Goal Achievement: 03/21/17 Potential to Achieve Goals: Fair Progress towards PT goals: Progressing toward goals    Frequency    Min 4X/week      PT Plan Current plan remains appropriate    Co-evaluation              AM-PAC PT "6 Clicks" Daily Activity  Outcome Measure  Difficulty turning over in bed (including adjusting bedclothes, sheets and blankets)?: None Difficulty moving from lying on back to sitting on the side of the bed? : None Difficulty sitting down on and standing up from a chair with arms (e.g., wheelchair, bedside commode, etc,.)?: Total Help needed moving to and from a bed to chair (including a wheelchair)?: A Little Help needed walking in hospital room?: A Little Help needed climbing 3-5 steps with a railing? : A Lot 6 Click Score: 17    End of Session Equipment Utilized During Treatment: Gait belt Activity Tolerance: Patient tolerated treatment well Patient left: in chair;with call bell/phone within reach;with nursing/sitter in room Nurse Communication: Mobility status PT Visit Diagnosis: Unsteadiness on feet (R26.81);Difficulty in walking, not elsewhere classified (R26.2);Other abnormalities of gait and mobility (R26.89);Ataxic gait (R26.0)     Time: 1191-4782 PT Time Calculation (min) (ACUTE ONLY): 18 min  Charges:  $Gait Training: 8-22 mins                    G Codes:       Alben Deeds, PT DPT  [facility] 03/10/2017, 9:27 AM

## 2017-03-10 NOTE — Progress Notes (Signed)
Rehab admissions - I have approval for acute inpatient rehab admission for today.  Bed available and will admit to acute inpatient rehab today.  Call me for questions.  #[phone] 

## 2017-03-10 NOTE — Progress Notes (Addendum)
Admit to unit, oriented to rehab, reviewed medications, orders and reviewed plan of care. No questions noted.  Margarito Liner

## 2017-03-10 NOTE — Progress Notes (Signed)
Meredith Staggers, MD Physician Signed Physical Medicine and Rehabilitation  Consult Note Date of Service: 03/07/2017 1:10 PM  Related encounter: ED to Hosp-Admission (Current) from 03/05/2017 in [facility] All Collapse All   [] Hide copied text [] Hover for attribution information      Physical Medicine and Rehabilitation Consult Reason for Consult: Decreased functional mobility with aphasia Referring Physician: Dr.Cram   HPI: Tracy Price is a 61 y.o. right handed female with history of CKD stage III, diabetes mellitus, hypertension, hyperlipidemia. Per chart review patient lives alone and was independent prior to admission. One level home 6 steps to entry. Brother in area can assist. Presented 03/05/2017 with sudden onset of headache with slurred speech as well as reported fall.  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 Underwent left-sided burr hole craniectomy for evacuation of subdural hematoma 03/05/2017 per Dr. Saintclair Halsted. Keppra for seizure prophylaxis. Decadron protocol is indicated. Physical therapy evaluation completed 03/07/2017 with recommendations of physical medicine rehabilitation consult.   Review of Systems  Constitutional: Negative for chills and fever.  HENT: Negative for hearing loss.   Eyes: Negative for blurred vision and double vision.  Respiratory: Negative for cough and shortness of breath.   Cardiovascular: Negative for chest pain, palpitations and leg swelling.  Gastrointestinal: Positive for constipation. Negative for nausea and vomiting.  Genitourinary: Negative for dysuria, flank pain and hematuria.  Musculoskeletal: Positive for joint pain and myalgias.  Skin: Negative for rash.  Neurological: Positive for speech change and headaches.  All other systems reviewed and are negative.  Past Medical History:  Diagnosis Date  . Chest pain   . CKD (chronic kidney  disease) stage 3, GFR 30-59 ml/min   . Diabetes mellitus without complication ([place])    type 2  . Hyperlipidemia   . Hypertension   . Sleep apnea 1995   had surgery to correct        Past Surgical History:  Procedure Laterality Date  . CRANIOTOMY Left 03/05/2017   Procedure: LEFT BURR HOLE  HEMATOMA EVACUATION SUBDURAL;  Surgeon: Kary Kos, MD;  Location: [facility];  Service: Neurosurgery;  Laterality: Left;  Marland Kitchen GASTRIC BYPASS  2005        Family History  Problem Relation Age of Onset  . Heart disease Mother   . Diabetes Sister 75       heart attack  . Heart disease Brother    Social History:  reports that she has never smoked. She has never used smokeless tobacco. She reports that she does not drink alcohol or use drugs. Allergies:       Allergies  Allergen Reactions  . Crestor [Rosuvastatin Calcium] Other (See Comments)    Mylgia  . Lipitor [Atorvastatin] Other (See Comments)    Mylagia   Medications Prior to Admission  Medication Sig Dispense Refill  . Empagliflozin-Linagliptin (GLYXAMBI) 25-5 MG TABS Take 1 tablet by mouth daily. 90 tablet 3  . ibuprofen (ADVIL,MOTRIN) 200 MG tablet Take 400 mg by mouth at bedtime as needed for pain.    Marland Kitchen losartan (COZAAR) 100 MG tablet Take 1 tablet (100 mg total) by mouth daily. 90 tablet 3  . Melatonin 1 MG TABS Take 1 mg by mouth at bedtime as needed.     . sertraline (ZOLOFT) 100 MG tablet TAKE 1 TABLET BY MOUTH EVERY DAY 90 tablet 3  . traMADol (ULTRAM) 50 MG tablet TAKE 1 TABLET BY MOUTH EVERY  6 HOURS AS NEEDED FOR PAIN 180 tablet 0  . atorvastatin (LIPITOR) 20 MG tablet Take 1 tablet (20 mg total) by mouth daily. (Patient not taking: Reported on 03/05/2017) 90 tablet 3    Home: [place] expects to be discharged to:: Private residence Living Arrangements: Alone Available Help at Discharge: Family (brother can provide assist at d/c.) Type of Home: House Home Access: Stairs to  enter [employer] of Steps: 6 Entrance Stairs-Rails: Right, Left, Can reach both Home Layout: One level Bathroom Shower/Tub: Tub/shower unit Home Equipment: None  Functional History: Prior Function Level of Independence: Independent 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 Functional Status:  Mobility: Bed Mobility Overal bed mobility: Needs Assistance Bed Mobility: Supine to Sit Supine to sit: Min guard, HOB elevated General bed mobility comments: Increased time to get to EOB but no assist needed.  Transfers Overall transfer level: Needs assistance Equipment used: None, Rolling walker (2 wheeled) Transfers: Sit to/from Stand Sit to Stand: Min guard General transfer comment: MIn guard for safety to stand from EOB. Some ataxia noted in standing. Stood from toilet without RW.  Ambulation/Gait Ambulation/Gait assistance: Min assist Ambulation Distance (Feet): 30 Feet Assistive device: Rolling walker (2 wheeled), None Gait Pattern/deviations: Step-through pattern, Decreased stride length, Ataxic General Gait Details: Slow, unsteady gait with mild ataxia noted throughout. VSS. Short step lengths bilaterally and unsure of self. Gait velocity: decreased Gait velocity interpretation: Below normal speed for age/gender  ADL:  Cognition: Cognition Overall Cognitive Status: Impaired/Different from baseline Orientation Level: Oriented X4 Cognition Arousal/Alertness: Awake/alert Behavior During Therapy: WFL for tasks assessed/performed Overall Cognitive Status: Impaired/Different from baseline Area of Impairment: Memory, Following commands, Problem solving Memory: Decreased short-term memory Following Commands: Follows one step commands with increased time (repetition/cues) General Comments: Some expressive aphasia noted. Sometimes goes on tangents unrelated to questions asked. Curious about what  happened and wanted more info. RN to give.   Blood pressure (!) 139/101, pulse 84, temperature 98.4 F (36.9 C), temperature source Oral, resp. rate 13, SpO2 98 %. Physical Exam  HENT:  Craniotomy site clean and dry  Eyes: EOM are normal.  Neck: Normal range of motion. Neck supple. No thyromegaly present.  Cardiovascular: Normal rate and regular rhythm.   Respiratory: Effort normal and breath sounds normal. No respiratory distress.  GI: Soft. Bowel sounds are normal. She exhibits no distension.  Neurological: She is alert.  Mixed aphasia but able to express thoughts and converse with extra time. Mild right central 7. Strength 4-/5 RUE and 4- to 4/5 RLE. LUE and LLE 4+ to 5/5. Senses pain in all 4's. Reasonable insight and awareness.      Lab Results Last 24 Hours       Results for orders placed or performed during the hospital encounter of 03/05/17 (from the past 24 hour(s))  Glucose, capillary     Status: None   Collection Time: 03/06/17  3:44 PM  Result Value Ref Range   Glucose-Capillary 97 65 - 99 mg/dL  Glucose, capillary     Status: Abnormal   Collection Time: 03/06/17  9:30 PM  Result Value Ref Range   Glucose-Capillary 124 (H) 65 - 99 mg/dL   Comment 1 Notify RN    Comment 2 Document in Chart   Glucose, capillary     Status: Abnormal   Collection Time: 03/07/17  7:31 AM  Result Value Ref Range   Glucose-Capillary 129 (H) 65 -  99 mg/dL  Glucose, capillary     Status: Abnormal   Collection Time: 03/07/17 11:31 AM  Result Value Ref Range   Glucose-Capillary 179 (H) 65 - 99 mg/dL      Imaging Results (Last 48 hours)  Ct Angio Head W Or Wo Contrast  Result Date: 03/05/2017 CLINICAL DATA:  61 year old female with mixed density left subdural hematoma. Progressive altered mental status and neurologic deficits for 2 weeks. EXAM: CT ANGIOGRAPHY HEAD TECHNIQUE: Multidetector CT imaging of the head was performed using the standard protocol during bolus  administration of intravenous contrast. Multiplanar CT image reconstructions and MIPs were obtained to evaluate the vascular anatomy. CONTRAST:  50 mL Isovue 370 COMPARISON:  Head CT without contrast 1238 hours today FINDINGS: Posterior circulation: Codominant distal vertebral arteries with mild irregularity but no stenosis. Patent PICA origins. Patent vertebrobasilar junction. Patent basilar artery. Mild distal basilar irregularity without significant stenosis. Patent SCA and PCA origins. Posterior communicating arteries are diminutive or absent. Mild bilateral P1 and P2 segment irregularity without stenosis. Anterior circulation: Patent distal cervical ICAs. Severe bilateral ICA siphon calcified plaque. Both siphons are patent but there is severe bilateral siphon stenosis affecting the left cavernous segment and right anterior genu (series 7 image 115). Normal ophthalmic artery origins. Patent carotid termini. Normal MCA and ACA origins. The left A1 segment is mildly dominant. Anterior communicating artery and bilateral ACA branches are within normal limits aside from mild rightward deviation. Right MCA M1 segment, bifurcation, and right MCA branches are within normal limits. Left MCA M1 segment is patent without stenosis. Left MCA bifurcation and left MCA branches appear normal aside from medial displacement from the left subdural hematoma. No active contrast extravasation into the mixed density left subdural collection is identified. Venous sinuses: Patent on the delayed phase images. Anatomic variants: Mildly dominant left A1 segment. Delayed phase: No abnormal enhancement identified. Stable intracranial mass effect with rightward midline shift of 14 mm. Mixed density left subdural collection measures up to 23 mm in thickness at the level of the operculum. Review of the MIP images confirms the above findings IMPRESSION: 1. Intracranial CTA is negative for aneurysm or left subdural space contrast extravasation,  but positive for high-grade bilateral ICA siphon stenosis related to bulky calcified plaque. 2. Other intracranial atherosclerosis without significant stenosis. 3. Mixed density left subdural hematoma measuring up to 2.3 cm in thickness and with rightward midline shift of 14 mm is stable since 12/30 1238 hours today. Electronically Signed   By: Genevie Ann M.D.   On: 03/05/2017 14:27   Ct Head Wo Contrast  Result Date: 03/06/2017 CLINICAL DATA:  Followup subdural hematoma. EXAM: CT HEAD WITHOUT CONTRAST TECHNIQUE: Contiguous axial images were obtained from the base of the skull through the vertex without intravenous contrast. COMPARISON:  CT HEAD Mar 05, 2017 FINDINGS: BRAIN: Interval evacuation LEFT holo hemispheric subdural hematoma with 16 mm residual component, decreased in 23 mm. 10 mm LEFT-to-RIGHT midline shift, decreased from 12 mm. Partially re-expanded LEFT lateral ventricle with persistent mild RIGHT ventricular entrapment and interstitial edema. No intraparenchymal hemorrhage or acute large vascular territory infarct. Basal cisterns are patent. Similar mild LEFT uncal herniation. VASCULAR: Severe calcific atherosclerosis of the carotid siphons. SKULL: No skull fracture. Two new LEFT frontal burr holes. Overlying soft tissue swelling, subcutaneous gas and skin staples. SINUSES/ORBITS: The mastoid air-cells and included paranasal sinuses are well-aerated.The included ocular globes and orbital contents are non-suspicious. OTHER: None. IMPRESSION: Interval burr hole placement for evacuation of LEFT subdural hematoma with  16 mm residual component. Residual 10 mm LEFT-to-RIGHT midline shift with persistent RIGHT ventricular entrapment. Electronically Signed   By: Elon Alas M.D.   On: 03/06/2017 04:56     Assessment/Plan: Diagnosis: left SDH after fall s/p craniotomy 1. Does the need for close, 24 hr/day medical supervision in concert with the patient's rehab needs make it unreasonable for this  patient to be served in a less intensive setting? Yes 2. Co-Morbidities requiring supervision/potential complications: DM, CKD III 3. Due to bladder management, bowel management, safety, skin/wound care, disease management, medication administration, pain management and patient education, does the patient require 24 hr/day rehab nursing? Yes 4. Does the patient require coordinated care of a physician, rehab nurse, PT (1-2 hrs/day, 5 days/week), OT (1-2 hrs/day, 5 days/week) and SLP (1-2 hrs/day, 5 days/week) to address physical and functional deficits in the context of the above medical diagnosis(es)? Yes Addressing deficits in the following areas: balance, endurance, locomotion, strength, transferring, bowel/bladder control, bathing, dressing, feeding, grooming, toileting, cognition, language and psychosocial support 5. Can the patient actively participate in an intensive therapy program of at least 3 hrs of therapy per day at least 5 days per week? Yes 6. The potential for patient to make measurable gains while on inpatient rehab is excellent 7. Anticipated functional outcomes upon discharge from inpatient rehab are modified independent  with PT, modified independent with OT, modified independent with SLP. 8. Estimated rehab length of stay to reach the above functional goals is: 7-10 days 9. Anticipated D/C setting: Home 10. Anticipated post D/C treatments: [facility] therapy 11. Overall Rehab/Functional Prognosis: excellent  RECOMMENDATIONS: This patient's condition is appropriate for continued rehabilitative care in the following setting: CIR Patient has agreed to participate in recommended program. Yes Note that insurance prior authorization may be required for reimbursement for recommended care.  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 Pam was active and independent prior to this fall. Rehab Admissions Coordinator to follow up.  Thanks,  Meredith Staggers, MD,  Mellody Drown    Cathlyn Parsons., PA-C 03/07/2017    Revision History                        Routing History

## 2017-03-10 NOTE — Progress Notes (Signed)
Retta Diones, RN Rehab Admission Coordinator Signed Physical Medicine and Rehabilitation  PMR Pre-admission Date of Service: 03/10/2017 2:50 PM  Related encounter: ED to Hosp-Admission (Current) from 03/05/2017 in [place]       [] Hide copied text PMR Admission Coordinator Pre-Admission Assessment  Patient: Tracy Price is an 61 y.o., female MRN: [id number] DOB: 01-01-1956 Height: 5' (152.4 cm) Weight: 72.3 kg (159 lb 6.3 oz)                                                                                                                                                  Insurance Information HMO:    PPO:       PCP:       IPA:       80/20:       OTHER:  Group # [id number] PRIMARY:  BCBS state health plan      Policy#: [id number]      Subscriber:  Jennelle Human CM Name: Philis Fendt      Phone#:  [phone]     Fax#:  [phone] Pre-Cert#:  [id number]      Employer:  Retired Benefits:  Phone #: [phone]     Name:  Hanley Seamen. Date: 10/07/16     Deduct:  $1250 (met $1250)      Out of Pocket Max:  [phone] (met $0)      Life Max:  unlimited CIR: 80% with $450 copay per admission      SNF:  805 with 100 days max Outpatient:  Medical necessity     Co-Pay:  $52/visit Home Health: 80%      Co-Pay: 20% DME: 80%     Co-Pay: 20% Providers: in network  Medicaid Application Date:        Case Manager:   Disability Application Date:        Case Worker:    Emergency Tax adviser Information    Name Relation Home Work Mobile   [place] Brother [phone]       Current Medical History  Patient Admitting Diagnosis: Left SDH after fall s/p craniotomy   History of Present Illness: A 61 y.o.right handed femalewith history of CKD stage III, diabetes mellitus, hypertension, hyperlipidemia with reports of 2 weeks history of waxing and waning of severe headaches and question of fall prior to onset. Brother reported  issues with confusion, slurred speech and unsteady gait with decline. 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 She underwent left-sided burr hole craniectomy for evacuation of SDH by Dr. Saintclair Halsted. Has been treated with Decadron and on Keppra for seizure prophylaxis. Patient with resultant ataxic gait, expressive deficits, and right sided weakness affecting mobility as well as ability to carry out ADL tasks.  CIR recommended for follow up therapy.    Total: 1=NIH  Past Medical History      Past Medical History:  Diagnosis Date  . Chest pain   . CKD (chronic kidney disease) stage 3, GFR 30-59 ml/min   . Diabetes mellitus without complication ([place])    type 2  . Hyperlipidemia   . Hypertension   . Sleep apnea 1995   had surgery to correct    Family History  family history includes Diabetes (age of onset: 35) in her sister; Heart disease in her brother and mother.  Prior Rehab/Hospitalizations:  Has the patient had major surgery during 100 days prior to admission? No  Current Medications   Current Facility-Administered Medications:  .  acetaminophen (TYLENOL) tablet 500 mg, 500 mg, Oral, Q6H PRN, Kary Kos, MD, 500 mg at 03/10/17 1046 .  albuterol (PROVENTIL) (2.5 MG/3ML) 0.083% nebulizer solution 2.5 mg, 2.5 mg, Nebulization, Once, Kary Kos, MD .  ALPRAZolam Duanne Moron) tablet 0.5 mg, 0.5 mg, Oral, Q6H PRN, Kary Kos, MD, 0.5 mg at 03/06/17 0800 .  atorvastatin (LIPITOR) tablet 20 mg, 20 mg, Oral, q1800, Kary Kos, MD .  bisacodyl (DULCOLAX) EC tablet 10 mg, 10 mg, Oral, Daily PRN, Kary Kos, MD, 10 mg at 03/10/17 0803 .  docusate sodium (COLACE) capsule 100 mg, 100 mg, Oral, BID, Kary Kos, MD, 100 mg at 03/10/17 1046 .  HYDROmorphone (DILAUDID) injection 0.5 mg, 0.5 mg, Intravenous, Q2H PRN, Kary Kos, MD, 0.5 mg at 03/08/17 1122 .  insulin aspart (novoLOG) injection 0-20 Units, 0-20 Units,  Subcutaneous, Q4H, Ditty, Kevan Ny, MD, 3 Units at 03/10/17 1218 .  labetalol (NORMODYNE,TRANDATE) injection 10-40 mg, 10-40 mg, Intravenous, Q10 min PRN, Kary Kos, MD .  levETIRAcetam (KEPPRA) tablet 500 mg, 500 mg, Oral, BID, Ditty, Kevan Ny, MD, 500 mg at 03/10/17 1047 .  linagliptin (TRADJENTA) tablet 5 mg, 5 mg, Oral, Daily, Kary Kos, MD, 5 mg at 03/10/17 1046 .  losartan (COZAAR) tablet 100 mg, 100 mg, Oral, Daily, Kary Kos, MD, 100 mg at 03/10/17 1046 .  menthol-cetylpyridinium (CEPACOL) lozenge 3 mg, 1 lozenge, Oral, PRN, Meyran, Ocie Cornfield, NP .  ondansetron (ZOFRAN) tablet 4 mg, 4 mg, Oral, Q4H PRN **OR** ondansetron (ZOFRAN) injection 4 mg, 4 mg, Intravenous, Q4H PRN, Kary Kos, MD .  pantoprazole (PROTONIX) EC tablet 40 mg, 40 mg, Oral, QHS, Rumbarger, Valeda Malm, RPH, 40 mg at 03/09/17 2050 .  promethazine (PHENERGAN) tablet 12.5-25 mg, 12.5-25 mg, Oral, Q4H PRN, Kary Kos, MD .  sertraline (ZOLOFT) tablet 100 mg, 100 mg, Oral, Daily, Kary Kos, MD, 100 mg at 03/10/17 1047 .  traMADol (ULTRAM) tablet 50 mg, 50 mg, Oral, Q6H PRN, Kary Kos, MD, 50 mg at 03/10/17 1440  Patients Current Diet: Diet Carb Modified Fluid consistency: Thin; Room service appropriate? Yes  Precautions / Restrictions Precautions Precautions: Fall Restrictions Weight Bearing Restrictions: No   Has the patient had 2 or more falls or a fall with injury in the past year?YesPatient reports 2 falls in the past year.  Prior Activity Level Community (5-7x/wk): Went out daily.  Was driving.  Home Assistive Devices / Equipment Home Assistive Devices/Equipment: None Home Equipment: None  Prior Device Use: Indicate devices/aids used by the patient prior to current illness, exacerbation or injury? 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 Drives. Used to care for mother who passed  away.  Self Care: Did the patient  need help bathing, dressing, using the toilet or eating?  Independent  Indoor Mobility: Did the patient need assistance with walking from room to room (with or without device)? Independent  Stairs: Did the patient need assistance with internal or external stairs (with or without device)? Independent  Functional Cognition: Did the patient need help planning regular tasks such as shopping or remembering to take medications? Independent  Current Functional Level Cognition  Overall Cognitive Status: Impaired/Different from baseline Orientation Level: Oriented X4 Following Commands: Follows one step commands with increased time General Comments: Some expressive aphasia continues to be noted. More memory recall this session about what happened.    Extremity Assessment (includes Sensation/Coordination)  Upper Extremity Assessment: Generalized weakness, RUE deficits/detail RUE Deficits / Details: noted ataxic with oral care products RUE Coordination: decreased fine motor, decreased gross motor  Lower Extremity Assessment: Defer to PT evaluation RLE Deficits / Details: Dysadiadochokinesia RLE during heel to shin.  RLE Sensation:  [facility])    ADLs  Overall ADL's : Needs assistance/impaired Eating/Feeding: Minimal assistance Eating/Feeding Details (indicate cue type and reason): red tubing provided to build up handles as Pt was demonstrating difficulty with grasping items Grooming: Wash/dry hands, Min guard, Cueing for sequencing, Standing Grooming Details (indicate cue type and reason): sink level Toilet Transfer: Min guard, RW, Ambulation, Comfort height toilet, Grab bars Functional mobility during ADLs: Minimal assistance, Rolling walker General ADL Comments: Pt could not write with pen during session, explained benefits of building up grip Pt asking "I did not have a stroke, why is this happening?" Ot provided encouragement and support.      Mobility  Overal bed mobility: Needs Assistance Bed Mobility: Supine to Sit Supine to sit: Min guard, HOB elevated General bed mobility comments: increased time and effort, min guard for safety    Transfers  Overall transfer level: Needs assistance Equipment used: 1 person hand held assist Transfers: Sit to/from Stand Sit to Stand: Min guard General transfer comment: Min guard for safety to stand from recliner. Some ataxia noted in standing. Stood from toilet without RW.     Ambulation / Gait / Stairs / Wheelchair Mobility  Ambulation/Gait Ambulation/Gait assistance: Min assist (ocassional moderate assist for instability during retraining) Ambulation Distance (Feet): 50 Feet (x2) Assistive device: 1 person hand held assist Gait Pattern/deviations: Step-through pattern, Decreased stride length, Ataxic General Gait Details: Focus of gait retraining with multi modal faciliatation cues to assist patient in weight shift and stride production. Patient given visual cues for upright posture and gaze stability. Patient continues to show deviations in gait but demonstrates improvements with re-education and carry over of external cues. Gait velocity: decreased Gait velocity interpretation: Below normal speed for age/gender    Posture / Balance Dynamic Sitting Balance Sitting balance - Comments: improved ability to reach outside BOS in sitting Balance Overall balance assessment: Needs assistance Sitting-balance support: Feet supported, No upper extremity supported Sitting balance-Leahy Scale: Good Sitting balance - Comments: improved ability to reach outside BOS in sitting Standing balance support: During functional activity Standing balance-Leahy Scale: Fair Standing balance comment: can static stand without UE support    Special needs/care consideration BiPAP/CPAP No CPM No Continuous Drip IV No Dialysis No       Life Vest No Oxygen No Special Bed No Trach Size No Wound Vac  (area) No     Skin Left scalp with staples, right arm wound  Bowel mgmt: No BM since 03/04/17 Bladder mgmt: Voiding with urgency and frequency Diabetic mgmt Yes, on oral medication at home.    Previous Home Environment Living Arrangements: Alone Available Help at Discharge: Family Type of Home: House Home Layout: One level Home Access: Stairs to enter Entrance Stairs-Rails: Right, Left, Can reach both Entrance Stairs-Number of Steps: 6 Bathroom Shower/Tub: Chiropodist: Standard Home Care Services: No Additional Comments: pt has (A) from brother and niece PRN   Discharge Living Setting Plans for Discharge Living Setting: Patient's home, Alone, House (Lives alone.) Type of Home at Discharge: House Discharge Home Layout: One level Discharge Home Access: Stairs to enter Entrance Stairs-Number of Steps: 6 Does the patient have any problems obtaining your medications?: No  Social/Family/Support Systems Patient Roles: Other (Comment) (Has a brother.) Contact Information: Mileidy Atkin - brother - [phone] Anticipated Caregiver: self Ability/Limitations of Caregiver: Brother is retired, not working, brother lives locally. Caregiver Availability: Intermittent (Has mod I goals) Discharge Plan Discussed with Primary Caregiver: Yes Is Caregiver In Agreement with Plan?: Yes Does Caregiver/Family have Issues with Lodging/Transportation while Pt is in Rehab?: No  Goals/Additional Needs Patient/Family Goal for Rehab: PT/OT/SLP mod I goals Expected length of stay: 7-10 days Cultural Considerations: Episcopal Dietary Needs: Carb mod, med calorie, thin liquids Equipment Needs: TBD Pt/Family Agrees to Admission and willing to participate: Yes Program Orientation Provided & Reviewed with Pt/Caregiver Including Roles  & Responsibilities: Yes  Decrease burden of Care through IP rehab admission: N/A  Possible need for SNF placement upon  discharge: Not planned  Patient Condition: This patient's medical and functional status has changed since the consult dated: 03/07/17 in which the Rehabilitation Physician determined and documented that the patient's condition is appropriate for intensive rehabilitative care in an inpatient rehabilitation facility. See "History of Present Illness" (above) for medical update. Functional changes are:  Currently requiring min assist to ambulate 50 feet (x@) +1 HHA. Patient's medical and functional status update has been discussed with the Rehabilitation physician and patient remains appropriate for inpatient rehabilitation. Will admit to inpatient rehab today.  Preadmission Screen Completed By:  Retta Diones, 03/10/2017 3:04 PM ______________________________________________________________________   Discussed status with Dr. Posey Pronto on 03/10/17 at 1503 and received telephone approval for admission today.  Admission Coordinator:  Retta Diones, time 1503/Date 03/10/17       Cosigned by: Jamse Arn, MD at 03/10/2017 3:16 PM  Revision History

## 2017-03-10 NOTE — Addendum Note (Signed)
Addendum  created 03/10/17 1405 by Shifra Swartzentruber, MD   Sign clinical note    

## 2017-03-10 NOTE — Progress Notes (Signed)
Noted pt. With no BM since 5/29. D/W patient and Dr. Saintclair Halsted; pt. Only wants pills right now, will give Bisacodyl po.

## 2017-03-10 NOTE — Progress Notes (Signed)
Patient ID: Tracy Price, female   DOB: September 08, 1956, 61 y.o.   MRN: [id number] Doing well awake and alert minimal headache  Strength 5 out of 5 wound clean dry and intact no pronator drift strength 5 out of 5 wound clean dry and intact no pronator drift  Okay to transfer to rehabilitation in bed becomes available.

## 2017-03-11 ENCOUNTER — Inpatient Hospital Stay (HOSPITAL_COMMUNITY): Payer: BC Managed Care – PPO | Admitting: Speech Pathology

## 2017-03-11 ENCOUNTER — Inpatient Hospital Stay (HOSPITAL_COMMUNITY): Payer: BC Managed Care – PPO | Admitting: Physical Therapy

## 2017-03-11 ENCOUNTER — Inpatient Hospital Stay (HOSPITAL_COMMUNITY): Payer: BC Managed Care – PPO | Admitting: Occupational Therapy

## 2017-03-11 DIAGNOSIS — N390 Urinary tract infection, site not specified: Secondary | ICD-10-CM

## 2017-03-11 DIAGNOSIS — N183 Chronic kidney disease, stage 3 (moderate): Secondary | ICD-10-CM

## 2017-03-11 DIAGNOSIS — E8809 Other disorders of plasma-protein metabolism, not elsewhere classified: Secondary | ICD-10-CM

## 2017-03-11 DIAGNOSIS — E46 Unspecified protein-calorie malnutrition: Secondary | ICD-10-CM

## 2017-03-11 DIAGNOSIS — S065X0S Traumatic subdural hemorrhage without loss of consciousness, sequela: Secondary | ICD-10-CM

## 2017-03-11 DIAGNOSIS — E119 Type 2 diabetes mellitus without complications: Secondary | ICD-10-CM

## 2017-03-11 DIAGNOSIS — I10 Essential (primary) hypertension: Secondary | ICD-10-CM

## 2017-03-11 DIAGNOSIS — D62 Acute posthemorrhagic anemia: Secondary | ICD-10-CM

## 2017-03-11 LAB — CBC WITH DIFFERENTIAL/PLATELET
Basophils Absolute: 0 10*3/uL (ref 0.0–0.1)
Basophils Relative: 0 %
Eosinophils Absolute: 0.7 10*3/uL (ref 0.0–0.7)
Eosinophils Relative: 10 %
HCT: 36.4 % (ref 36.0–46.0)
Hemoglobin: 11.8 g/dL — ABNORMAL LOW (ref 12.0–15.0)
Lymphocytes Relative: 32 %
Lymphs Abs: 2.3 10*3/uL (ref 0.7–4.0)
MCH: 28.6 pg (ref 26.0–34.0)
MCHC: 32.4 g/dL (ref 30.0–36.0)
MCV: 88.3 fL (ref 78.0–100.0)
Monocytes Absolute: 0.6 10*3/uL (ref 0.1–1.0)
Monocytes Relative: 8 %
Neutro Abs: 3.7 10*3/uL (ref 1.7–7.7)
Neutrophils Relative %: 50 %
Platelets: 307 10*3/uL (ref 150–400)
RBC: 4.12 MIL/uL (ref 3.87–5.11)
RDW: 12.7 % (ref 11.5–15.5)
WBC: 7.4 10*3/uL (ref 4.0–10.5)

## 2017-03-11 LAB — COMPREHENSIVE METABOLIC PANEL
ALBUMIN: 3 g/dL — AB (ref 3.5–5.0)
ALK PHOS: 125 U/L (ref 38–126)
ALT: 9 U/L — AB (ref 14–54)
AST: 14 U/L — AB (ref 15–41)
Anion gap: 9 (ref 5–15)
BUN: 21 mg/dL — AB (ref 6–20)
CALCIUM: 8.9 mg/dL (ref 8.9–10.3)
CO2: 25 mmol/L (ref 22–32)
Chloride: 103 mmol/L (ref 101–111)
Creatinine, Ser: 1.16 mg/dL — ABNORMAL HIGH (ref 0.44–1.00)
GFR calc Af Amer: 58 mL/min — ABNORMAL LOW (ref >60)
GFR calc non Af Amer: 50 mL/min — ABNORMAL LOW (ref >60)
GLUCOSE: 102 mg/dL — AB (ref 65–99)
Potassium: 3.6 mmol/L (ref 3.5–5.1)
Sodium: 137 mmol/L (ref 135–145)
Total Bilirubin: 0.5 mg/dL (ref 0.3–1.2)
Total Protein: 6.3 g/dL — ABNORMAL LOW (ref 6.5–8.1)

## 2017-03-11 LAB — GLUCOSE, CAPILLARY
GLUCOSE-CAPILLARY: 97 mg/dL (ref 65–99)
GLUCOSE-CAPILLARY: 93 mg/dL (ref 65–99)
Glucose-Capillary: 89 mg/dL (ref 65–99)
Glucose-Capillary: 123 mg/dL — ABNORMAL HIGH (ref 65–99)
Glucose-Capillary: 152 mg/dL — ABNORMAL HIGH (ref 65–99)

## 2017-03-11 MED ORDER — NITROFURANTOIN MONOHYD MACRO 100 MG PO CAPS
100.0000 mg | ORAL_CAPSULE | Freq: Two times a day (BID) | ORAL | Status: AC
  Administered 2017-03-11 – 2017-03-17 (×14): 100 mg via ORAL
  Filled 2017-03-11 (×14): qty 1

## 2017-03-11 MED ORDER — PRO-STAT SUGAR FREE PO LIQD
30.0000 mL | Freq: Two times a day (BID) | ORAL | Status: DC
  Administered 2017-03-11 – 2017-03-12 (×2): 30 mL via ORAL
  Filled 2017-03-11 (×16): qty 30

## 2017-03-11 NOTE — Evaluation (Signed)
Occupational Therapy Assessment and Plan  Patient Details  Name: Tracy Price MRN: [id number] Date of Birth: 10-28-1955  OT Diagnosis: acute pain, cognitive deficits, disturbance of vision and muscle weakness (generalized) Rehab Potential: Rehab Potential (ACUTE ONLY): Good ELOS: 7-10 days   Today's Date: 03/11/2017 OT Individual Time: 4098-1191 OT Individual Time Calculation (min): 60 min     Problem List:  Patient Active Problem List   Diagnosis Date Noted  . Subdural hemorrhage following injury ([place]) 03/10/2017  . Vascular headache   . Ataxia   . Benign essential HTN   . Acute blood loss anemia   . Stage 3 chronic kidney disease   . Leukocytosis   . Diabetes mellitus type 2 in nonobese (HCC)   . OSA (obstructive sleep apnea)   . Major depressive disorder with single episode   . Constipation   . Seizure prophylaxis   . SDH (subdural hematoma) ([place]) 03/05/2017  . Cerebral aneurysm 03/05/2017  . Family history of heart disease 02/25/2013  . Chest pain 02/25/2013  . Diabetes mellitus without complication ([place])   . Hyperlipidemia   . Hypertension     Past Medical History:  Past Medical History:  Diagnosis Date  . Chest pain   . CKD (chronic kidney disease) stage 3, GFR 30-59 ml/min   . Diabetes mellitus without complication ([place])    type 2  . Hyperlipidemia   . Hypertension   . Sleep apnea 1995   had surgery to correct   Past Surgical History:  Past Surgical History:  Procedure Laterality Date  . CRANIOTOMY Left 03/05/2017   Procedure: LEFT BURR HOLE  HEMATOMA EVACUATION SUBDURAL;  Surgeon: Kary Kos, MD;  Location: [facility];  Service: Neurosurgery;  Laterality: Left;  Marland Kitchen GASTRIC BYPASS  2005    Assessment & Plan Clinical Impression: Patient is a 61 y.o. right handed femalewith history of CKD stage III, diabetes mellitus, hypertension, hyperlipidemia with reports of 2 weeks history of waxing and waning of severe headaches and question of fall prior to onset.  History taken from chart review and patient. Brother reported issues with confusion, slurred speech and unsteady gait with decline. She presented to ED 03/05/17 for evaluation and  CT ordered, reviewed, showing SDH.  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 She underwent left-sided burr hole craniectomy for evacuation of SDH by Dr. Saintclair Halsted. Has been treated with Decadron and on Keppra for seizure prophylaxis. Patient with resultant ataxic gait, expressive deficits, left visual field deficits and right sided weakness affecting mobility as well as ability to carry out ADL tasks. CIR recommended for follow up therapy.     Patient transferred to CIR on 03/10/2017 .    Patient currently requires min with basic self-care skills secondary to muscle weakness, ataxia and decreased coordination, decreased visual perceptual skills and decreased visual motor skills, decreased attention, decreased awareness and decreased safety awareness and decreased standing balance.  Prior to hospitalization, patient could complete ADLs and IADLs with independent .  Patient will benefit from skilled intervention to increase independence with basic self-care skills and increase level of independence with iADL prior to discharge home independently.  Anticipate patient will require intermittent supervision and follow up home health (TBD).  OT - End of Session Activity Tolerance: Tolerates 30+ min activity with multiple rests Endurance Deficit: No OT Assessment Rehab Potential (ACUTE ONLY): Good Barriers to Discharge: Decreased caregiver support Barriers to Discharge Comments: pt lives in [facility], brother and niece in [place] Patient demonstrates impairments  in the following area(s): Balance;Cognition;Endurance;Motor;Pain;Safety OT Basic ADL's Functional Problem(s): Grooming;Bathing;Dressing;Toileting;Eating OT Advanced ADL's Functional Problem(s): Simple Meal Preparation OT Transfers  Functional Problem(s): Toilet;Tub/Shower OT Additional Impairment(s): Fuctional Use of Upper Extremity OT Plan OT Intensity: Minimum of 1-2 x/day, 45 to 90 minutes OT Frequency: 5 out of 7 days OT Duration/Estimated Length of Stay: 7-10 days OT Treatment/Interventions: Balance/vestibular training;Cognitive remediation/compensation;Community reintegration;Discharge planning;Disease mangement/prevention;DME/adaptive equipment instruction;Functional mobility training;Neuromuscular re-education;Pain management;Patient/family education;Psychosocial support;Self Care/advanced ADL retraining;Skin care/wound managment;Therapeutic Activities;Therapeutic Exercise;UE/LE Strength taining/ROM;UE/LE Coordination activities;Visual/perceptual remediation/compensation OT Self Feeding Anticipated Outcome(s): Mod I OT Basic Self-Care Anticipated Outcome(s): Mod I OT Toileting Anticipated Outcome(s): Mod I OT Bathroom Transfers Anticipated Outcome(s): Mod I OT Recommendation Recommendations for Other Services: Therapeutic Recreation consult Therapeutic Recreation Interventions: Kitchen group;Outing/community reintergration Patient destination: Home Follow Up Recommendations: Home health OT Equipment Recommended: Tub/shower bench   Skilled Therapeutic Intervention   OT Evaluation Precautions/Restrictions  Precautions Precautions: Fall General   Vital Signs   Pain Pain Assessment Pain Assessment: No/denies pain Pain Score: 3  Pain Location: Head Pain Orientation: Left Pain Descriptors / Indicators: Throbbing Pain Onset: Gradual Patients Stated Pain Goal: 1 Pain Intervention(s): Medication (See eMAR) Home Living/Prior Functioning Home Living Available Help at Discharge: Available PRN/intermittently, Family (pt lives in [facility], brother in [place]) Type of Home: House Home Access: Stairs to enter Technical brewer of Steps: 6 Entrance Stairs-Rails: Left, Can reach both (can steady  on side of house on Rt) Home Layout: One level Bathroom Shower/Tub: Chiropodist: Standard  Lives With: Alone IADL History Homemaking Responsibilities: Yes Meal Prep Responsibility: Primary Laundry Responsibility: Primary Cleaning Responsibility: Primary Bill Paying/Finance Responsibility: Primary Shopping Responsibility: Primary Current License: Yes Occupation: Retired Prior Function Level of Independence: Independent with basic ADLs, Independent with homemaking with ambulation, Independent with gait  Able to Take Stairs?: Yes Driving: Yes Vocation: Retired 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 ADL   Vision Baseline Vision/History: Wears glasses Wears Glasses: Reading only Patient Visual Report:  (reports vision is improving) Vision Assessment?: Yes Eye Alignment: Within Functional Limits Ocular Range of Motion: Within Functional Limits Alignment/Gaze Preference: Within Defined Limits Tracking/Visual Pursuits: Decreased smoothness of horizontal tracking Saccades: Additional eye shifts occurred during testing;Decreased speed of saccadic movement Visual Fields: No apparent deficits Cognition Overall Cognitive Status: Impaired/Different from baseline Arousal/Alertness: Awake/alert Orientation Level: Person;Place;Situation Person: Oriented Place: Oriented Situation: Oriented Year: 2018 Month: June Day of Week: Correct Immediate Memory Recall: Sock;Blue;Bed Memory Recall: Sock;Blue;Bed Memory Recall Sock: Without Cue Memory Recall Blue: Without Cue Memory Recall Bed: Without Cue Attention: Selective Selective Attention: Appears intact Awareness: Impaired Sensation Sensation Light Touch: Appears Intact Stereognosis: Not tested Hot/Cold: Appears Intact Proprioception: Appears Intact Coordination Gross Motor Movements are Fluid and Coordinated: No Fine Motor Movements are  Fluid and Coordinated: No Finger Nose Finger Test: impaired Rt > Lt Extremity/Trunk Assessment RUE Assessment RUE Assessment: Exceptions to [facility] (shoulder flexion limited to 90 degrees, reporting h/o shoulder injury.  Strength grossly 4/5, decreased coordination) LUE Assessment LUE Assessment: Within Functional Limits   See Function Navigator for Current Functional Status.   Refer to Care Plan for Long Term Goals  Recommendations for other services: Therapeutic Recreation  Kitchen group and Outing/community reintegration   Discharge Criteria: Patient will be discharged from OT if patient refuses treatment 3 consecutive times without medical reason, if treatment goals not met, if there is a change in medical status, if patient makes no progress towards goals or  if patient is discharged from hospital.  The above assessment, treatment plan, treatment alternatives and goals were discussed and mutually agreed upon: by patient  Simonne Come 03/11/2017, 8:58 AM

## 2017-03-11 NOTE — IPOC Note (Signed)
Overall Plan of Care [facility]) Patient Details Name: Tracy Price MRN: [id number] DOB: 12/31/1955  Admitting Diagnosis: Confusion  Hospital Problems: Active Problems:   Subdural hemorrhage following injury ([place])   Acute lower UTI   Hypoalbuminemia due to protein-calorie malnutrition ([place])     Functional Problem List: Nursing Pain, Bowel, Safety, Endurance, Medication Management, Skin Integrity  PT Balance, Endurance, Motor, Safety  OT Balance, Cognition, Endurance, Motor, Pain, Safety  SLP Linguistic  TR         Basic ADL's: OT Grooming, Bathing, Dressing, Toileting, Eating     Advanced  ADL's: OT Simple Meal Preparation     Transfers: PT Bed to Chair, Furniture, Floor, Teacher, early years/pre, Tub/Shower     Locomotion: PT Ambulation, Stairs     Additional Impairments: OT Fuctional Use of Upper Extremity  SLP Communication expression    TR      Anticipated Outcomes Item Anticipated Outcome  Self Feeding Mod I  Swallowing      Basic self-care  Mod I  Toileting  Mod I   Bathroom Transfers Mod I  Bowel/Bladder  manage bowel with mod I assist  Transfers  mod I  Locomotion  mod I  Communication  mod I   Cognition  mod I   Pain  Pain at or below level 3  Safety/Judgment  maintain safety with cues/reminders   Therapy Plan: PT Intensity: Minimum of 1-2 x/day ,45 to 90 minutes PT Frequency: 5 out of 7 days PT Duration Estimated Length of Stay: 7-10 days OT Intensity: Minimum of 1-2 x/day, 45 to 90 minutes OT Frequency: 5 out of 7 days OT Duration/Estimated Length of Stay: 7-10 days SLP Intensity: Minumum of 1-2 x/day, 30 to 90 minutes SLP Frequency: 3 to 5 out of 7 days SLP Duration/Estimated Length of Stay: 7-10 days        Team Interventions: Nursing Interventions Patient/Family Education, Disease Management/Prevention, Skin Care/Wound Management, Discharge Planning, Pain Management, Psychosocial Support, Bowel Management, Medication Management  PT  interventions Ambulation/gait training, Cognitive remediation/compensation, Discharge planning, DME/adaptive equipment instruction, Functional mobility training, Pain management, Splinting/orthotics, Therapeutic Activities, UE/LE Strength taining/ROM, Wheelchair propulsion/positioning, UE/LE Coordination activities, Therapeutic Exercise, Stair training, Patient/family education, Neuromuscular re-education, Functional electrical stimulation, Training and development officer, Community reintegration  OT Interventions Training and development officer, Cognitive remediation/compensation, Academic librarian, Discharge planning, Disease mangement/prevention, Engineer, drilling, Functional mobility training, Neuromuscular re-education, Pain management, Patient/family education, Psychosocial support, Self Care/advanced ADL retraining, Skin care/wound managment, Therapeutic Activities, Therapeutic Exercise, UE/LE Strength taining/ROM, UE/LE Coordination activities, Visual/perceptual remediation/compensation  SLP Interventions Cueing hierarchy, Internal/external aids, Patient/family education, Speech/Language facilitation  TR Interventions    SW/CM Interventions Discharge Planning, Psychosocial Support, Patient/Family Education    Team Discharge Planning: Destination: PT-Home ,OT- Home , SLP-Home Projected Follow-up: PT-Outpatient PT, OT-  Home health OT, SLP-Outpatient SLP Projected Equipment Needs: PT-To be determined, OT- Tub/shower bench, SLP-None recommended by SLP Equipment Details: PT- , OT-  Patient/family involved in discharge planning: PT- Patient,  OT-Patient, SLP-Patient  MD ELOS: 7-10 day. Medical Rehab Prognosis:  Good Assessment: 61 y.o. right handed female with history of CKD stage III, diabetes mellitus, hypertension, hyperlipidemia with reports of 2 weeks history of waxing and waning of severe headaches and question of fall prior to onset. Brother reported issues with confusion,  slurred speech and unsteady gait with decline. 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 She underwent left-sided burr hole craniectomy for evacuation of SDH by Dr. Saintclair Halsted. Has  been treated with Decadron and on Keppra for seizure prophylaxis. Patient with resultant ataxic gait, expressive deficits, left visual field deficits and right sided weakness affecting mobility as well as ability to carry out ADL tasks. Will set goals for Mod I with PT/OT/SLP.  See Team Conference Notes for weekly updates to the plan of care

## 2017-03-11 NOTE — Progress Notes (Signed)
Occupational Therapy Session Note  Patient Details  Name: Tracy Price MRN: [id number] Date of Birth: [birth year]  Today's Date: 03/11/2017 OT Individual Time: 1638-4536 OT Individual Time Calculation (min): 28 min    Short Term Goals: Week 1:  OT Short Term Goal 1 (Week 1): STG = LTGs due to ELOS  Skilled Therapeutic Interventions/Progress Updates:    Pt presented sitting up in recliner ready for OT treatment session. Pt completed short functional mobility with RW and MinA to transfer to w/c. Assisted Pt to dayroom via w/c where Pt completed seated tabletop activities. Pt completed visual scanning to sort playing cards, using R UE to independently reach, pick up and place cards based on suit and color. Pt then completed seated scanning and matching activity using R UE to place cards in correct corresponding location. After return to room Pt completed functional mobility with RW with MinA to return to bed. [name], bed alarm activated, with call bell and needs within reach.   Therapy Documentation Precautions:  Precautions Precautions: Fall Restrictions Weight Bearing Restrictions: No General:     Pain: Pain Assessment Pain Assessment: Faces Pain Score:  Faces Pain Scale: No hurt    See Function Navigator for Current Functional Status.   Therapy/Group: Individual Therapy  Raymondo Band 03/11/2017, 4:53 PM

## 2017-03-11 NOTE — Evaluation (Signed)
Physical Therapy Assessment and Plan  Patient Details  Name: Tracy Price MRN: [id number] Date of Birth: [birth year]  PT Diagnosis: Abnormality of gait, Difficulty walking, Hemiplegia dominant, Impaired cognition and Muscle weakness Rehab Potential: Good ELOS: 7-10 days   Today's Date: 03/11/2017 PT Individual Time: 0830-0930 PT Individual Time Calculation (min): 60 min    Problem List:  Patient Active Problem List   Diagnosis Date Noted  . Subdural hemorrhage following injury ([place]) 03/10/2017  . Vascular headache   . Ataxia   . Benign essential HTN   . Acute blood loss anemia   . Stage 3 chronic kidney disease   . Leukocytosis   . Diabetes mellitus type 2 in nonobese ([facility])   . OSA (obstructive sleep apnea)   . Major depressive disorder with single episode   . Constipation   . Seizure prophylaxis   . SDH (subdural hematoma) ([place]) 03/05/2017  . Cerebral aneurysm 03/05/2017  . Family history of heart disease 02/25/2013  . Chest pain 02/25/2013  . Diabetes mellitus without complication ([place])   . Hyperlipidemia   . Hypertension     Past Medical History:  Past Medical History:  Diagnosis Date  . Chest pain   . CKD (chronic kidney disease) stage 3, GFR 30-59 ml/min   . Diabetes mellitus without complication ([place])    type 2  . Hyperlipidemia   . Hypertension   . Sleep apnea 1995   had surgery to correct   Past Surgical History:  Past Surgical History:  Procedure Laterality Date  . CRANIOTOMY Left 03/05/2017   Procedure: LEFT BURR HOLE  HEMATOMA EVACUATION SUBDURAL;  Surgeon: Kary Kos, MD;  Location: [place];  Service: Neurosurgery;  Laterality: Left;  Marland Kitchen GASTRIC BYPASS  2005    Assessment & Plan Clinical Impression: Patient is a 61 y.o. year old female with recent admission to the hospital on 03/05/17 for evaluation and  CT ordered, reviewed, showing SDH.  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 She underwent left-sided burr hole craniectomy for  evacuation of SDH .  Patient transferred to CIR on 03/10/2017 .   Patient currently requires min with mobility secondary to muscle weakness, decreased awareness and decreased standing balance, hemiplegia and decreased balance strategies.  Prior to hospitalization, patient was independent  with mobility and lived with Alone in a House home.  Home access is 4Stairs to enter.  Patient will benefit from skilled PT intervention to maximize safe functional mobility, minimize fall risk and decrease caregiver burden for planned discharge home with intermittent assist.  Anticipate patient will benefit from follow up OP at discharge.  PT - End of Session Activity Tolerance: Tolerates 30+ min activity with multiple rests Endurance Deficit: No PT Assessment Rehab Potential (ACUTE/IP ONLY): Good Barriers to Discharge: Decreased caregiver support PT Patient demonstrates impairments in the following area(s): Balance;Endurance;Motor;Safety PT Transfers Functional Problem(s): Bed to Chair;Furniture;Floor;Car PT Locomotion Functional Problem(s): Ambulation;Stairs PT Plan PT Intensity: Minimum of 1-2 x/day ,45 to 90 minutes PT Frequency: 5 out of 7 days PT Duration Estimated Length of Stay: 7-10 days PT Treatment/Interventions: Ambulation/gait training;Cognitive remediation/compensation;Discharge planning;DME/adaptive equipment instruction;Functional mobility training;Pain management;Splinting/orthotics;Therapeutic Activities;UE/LE Strength taining/ROM;Wheelchair propulsion/positioning;UE/LE Coordination activities;Therapeutic Exercise;Stair training;Patient/family education;Neuromuscular re-education;Functional electrical stimulation;Balance/vestibular training;Community reintegration PT Transfers Anticipated Outcome(s): mod I PT Locomotion Anticipated Outcome(s): mod I PT Recommendation Recommendations for Other Services: Therapeutic Recreation consult Therapeutic Recreation Interventions: Outing/community  reintergration Follow Up Recommendations: Outpatient PT Patient destination: Home Equipment Recommended: To be determined  Skilled Therapeutic Intervention Pt participated in skilled PT  eval and was educated on PT POC and goals, pt verbalizes understanding.  Pt performs gait without AD with HHA, min A x 20'.  With RW pt able to gait with close supervision 50' x 2.  Stair negotiation with 2 handrails 2 x 4 stairs with min A.  Ramp negotiation with RW with min A for safety.  Pt completed BERG balance test with score of 27/56, pt made aware of her high risk for falls, pt verbalizes understanding.  Pt with difficulty standing for > 90 seconds due to LE fatigue.  Pt with some deficits in anticipatory awareness, difficulty understanding PT's recommendation of staying close to family as she made need assistance at d/c.  PT Evaluation Precautions/Restrictions Precautions Precautions: Fall Restrictions Weight Bearing Restrictions: No Pain Pain Assessment Pain Assessment: No/denies pain  Home Living/Prior Functioning Home Living Available Help at Discharge: Available PRN/intermittently;Family Type of Home: House Home Access: Stairs to enter Entrance Stairs-Number of Steps: 4 Entrance Stairs-Rails: Left Home Layout: One level Bathroom Shower/Tub: Tub/shower unit Bathroom Toilet: Standard  Lives With: Alone Prior Function Level of Independence: Independent with basic ADLs;Independent with homemaking with ambulation;Independent with gait;Independent with transfers  Able to Take Stairs?: Yes Driving: Yes Vocation: Retired Comments: Remodeling her house; used to be a health inspector. Drives. Used to care for mother who passed away.  Cognition Overall Cognitive Status: Impaired/Different from baseline Arousal/Alertness: Awake/alert Attention: Selective Selective Attention: Appears intact Awareness: Impaired Awareness Impairment: Anticipatory impairment Sensation Sensation Light Touch:  Appears Intact Stereognosis: Not tested Hot/Cold: Appears Intact Proprioception: Appears Intact Coordination Gross Motor Movements are Fluid and Coordinated: No Fine Motor Movements are Fluid and Coordinated: No Coordination and Movement Description: impaired Rt LE Finger Nose Finger Test: impaired Rt > Lt Motor  Motor Motor: Hemiplegia Motor - Skilled Clinical Observations: Rt sided weakness   Trunk/Postural Assessment  Cervical Assessment Cervical Assessment: Within Functional Limits Thoracic Assessment Thoracic Assessment:  (mild kyphosis) Lumbar Assessment Lumbar Assessment:  (posterior pelvic tilt) Postural Control Postural Control: Deficits on evaluation Righting Reactions: delayed  Balance Standardized Balance Assessment Standardized Balance Assessment: Berg Balance Test Berg Balance Test Sit to Stand: Able to stand without using hands and stabilize independently Standing Unsupported: Able to stand 30 seconds unsupported Sitting with Back Unsupported but Feet Supported on Floor or Stool: Able to sit safely and securely 2 minutes Stand to Sit: Controls descent by using hands Transfers: Needs one person to assist Standing Unsupported with Eyes Closed: Able to stand 10 seconds with supervision Standing Ubsupported with Feet Together: Able to place feet together independently but unable to hold for 30 seconds From Standing, Reach Forward with Outstretched Arm: Reaches forward but needs supervision From Standing Position, Pick up Object from Floor: Able to pick up shoe, needs supervision From Standing Position, Turn to Look Behind Over each Shoulder: Needs supervision when turning Turn 360 Degrees: Needs close supervision or verbal cueing Standing Unsupported, Alternately Place Feet on Step/Stool: Able to complete >2 steps/needs minimal assist Standing Unsupported, One Foot in Front: Needs help to step but can hold 15 seconds Standing on One Leg: Unable to try or needs  assist to prevent fall Total Score: 27 Extremity Assessment  RUE Assessment RUE Assessment: Exceptions to WFL (shoulder flexion limited to 90 degrees, reporting h/o shoulder injury.  Strength grossly 4/5, decreased coordination) LUE Assessment LUE Assessment: Within Functional Limits RLE Assessment RLE Assessment:  (hip abd 3-/5, hip flex 3/5, knee ext 3/5, ankle PF/DF 3/5) LLE Assessment LLE Assessment: Within Functional Limits     See Function Navigator for Current Functional Status.   Refer to Care Plan for Long Term Goals  Recommendations for other services: Therapeutic Recreation  Outing/community reintegration  Discharge Criteria: Patient will be discharged from PT if patient refuses treatment 3 consecutive times without medical reason, if treatment goals not met, if there is a change in medical status, if patient makes no progress towards goals or if patient is discharged from hospital.  The above assessment, treatment plan, treatment alternatives and goals were discussed and mutually agreed upon: by patient  Aleysia Oltmann 03/11/2017, 9:30 AM

## 2017-03-11 NOTE — Progress Notes (Signed)
[place] PHYSICAL MEDICINE & REHABILITATION     PROGRESS NOTE  Subjective/Complaints:  Pt seen sitting up in her chair this AM.  She slept well overnight.  She is tired from 1 therapy session today. 1  ROS: Denies CP, SOB, N/V/D.  Objective: Vital Signs: Blood pressure 117/62, pulse 71, temperature 98.4 F (36.9 C), temperature source Oral, resp. rate 16, weight 73.2 kg (161 lb 6.4 oz), SpO2 100 %. No results found.  Recent Labs  03/11/17 0550  WBC 7.4  HGB 11.8*  HCT 36.4  PLT 307    Recent Labs  03/11/17 0550  NA 137  K 3.6  CL 103  GLUCOSE 102*  BUN 21*  CREATININE 1.16*  CALCIUM 8.9   CBG (last 3)   Recent Labs  03/10/17 0715 03/10/17 1145 03/10/17 1612  GLUCAP 102* 130* 152*    Wt Readings from Last 3 Encounters:  03/10/17 73.2 kg (161 lb 6.4 oz)  03/08/17 72.3 kg (159 lb 6.3 oz)  04/30/16 61.7 kg (136 lb)    Physical Exam:  BP 117/62 (BP Location: Left Arm)   Pulse 71   Temp 98.4 F (36.9 C) (Oral)   Resp 16   Wt 73.2 kg (161 lb 6.4 oz)   SpO2 100%   BMI 31.52 kg/m  Constitutional: She appears well-developed and well-nourished. NAD. HENT: Left crani incisions with staples in place and dry crusted blood.   Eyes: EOMI. No discharge.  Cardiovascular: Normal rate and regular rhythm.  No JVD. Respiratory: Effort normal and breath sounds normal.  GI: Soft. Bowel sounds are normal.  Musculoskeletal: She exhibits no edema or tenderness. Neurological: She is alert and oriented.  Speech clear.  Able to follow one and two step commands without difficulty.  Motor: RUE: 4+/5 proximal to distal RLE: 4-/5 proximal to distal LUE/LLE: 5/5 proximal to distal  Skin: Resolving ecchymosis RUE/RLE  Psychiatric: She has a normal mood and affect. Her behavior is normal. Thought content normal.   Assessment/Plan: 1. 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 Physiatrist is providing close team supervision and 24 hour management of active medical problems listed below. Physiatrist and rehab team continue to assess barriers to discharge/monitor patient progress toward functional and medical goals.  Function:  Bathing Bathing position   Position: Shower  Bathing parts Body parts bathed by patient: Right arm, Left arm, Chest, Abdomen, Front perineal area, Buttocks, Right upper leg, Left upper leg    Bathing assist Assist Level: Touching or steadying assistance(Pt > 75%)      Upper Body Dressing/Undressing Upper body dressing   What is the patient wearing?: Bra, Pull over shirt/dress Bra - Perfomed by patient: Thread/unthread right bra strap, Thread/unthread left bra strap Bra - Perfomed by helper: Hook/unhook bra (pull down sports bra) Pull over shirt/dress - Perfomed by patient: Thread/unthread left sleeve, Thread/unthread right sleeve, Put head through opening, Pull shirt over trunk          Upper body assist Assist Level: Touching or steadying assistance(Pt > 75%)      Lower Body Dressing/Undressing Lower body dressing   What is the patient wearing?: Underwear, Pants, Socks, Shoes Underwear - Performed by patient: Thread/unthread right underwear leg, Thread/unthread left underwear leg, Pull underwear up/down   Pants- Performed by patient: Thread/unthread right pants leg, Thread/unthread left pants leg, Pull pants up/down, Fasten/unfasten pants       Socks - Performed by patient: Don/doff  right sock, Don/doff left sock   Shoes - Performed by patient: Don/doff right shoe, Don/doff left shoe, Fasten right, Fasten left            Lower body assist Assist for lower body dressing: Touching or steadying assistance (Pt > 75%)      Toileting Toileting   Toileting steps completed by patient: Adjust clothing prior to toileting, Performs perineal hygiene, Adjust clothing after toileting   Toileting Assistive Devices: Grab  bar or rail  Toileting assist Assist level: Touching or steadying assistance (Pt.75%)   Transfers Chair/bed transfer   Chair/bed transfer method: Ambulatory Chair/bed transfer assist level: Touching or steadying assistance (Pt > 75%) Chair/bed transfer assistive device: Medical sales representative     Max distance: 20 Assist level: Touching or steadying assistance (Pt > 75%)   Wheelchair          Cognition Comprehension Comprehension assist level: Follows basic conversation/direction with extra time/assistive device  Expression Expression assist level: Expresses basic 90% of the time/requires cueing < 10% of the time.  Social Interaction Social Interaction assist level: Interacts appropriately 90% of the time - Needs monitoring or encouragement for participation or interaction.  Problem Solving Problem solving assist level: Solves basic 90% of the time/requires cueing < 10% of the time  Memory Memory assist level: Recognizes or recalls 90% of the time/requires cueing < 10% of the time    Medical Problem List and Plan: 1.  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   Begin CIR 2.  DVT Prophylaxis/Anticoagulation: Mechanical: Sequential compression devices, below knee Bilateral lower extremities 3. Headaches/Pain Management: tramadol prn 4. Mood: LCSW to follow for evaluation and support.  5. Neuropsych: This patient is capable of making decisions on her own behalf. 6. Skin/Wound Care: routine pressure relief measures. Maintain adequate nutritional and hydration status.  7. Fluids/Electrolytes/Nutrition: Monitor I/O.  8. HTN: Monitor BP bid. Continue cozaar  Monitor with increased activity 9. ABLA: Monitor serially.   Hb 11.8 on 6/5  Cont to monitor 10. CKD stage 3: Encourage fluid intake.   Cr 1.16 on 6/5  Cont to monitor 11. Leucocytosis:  Resolved  Monitor for signs of infection.  12. T2DM: Hgb A1c- 6.5. Monitor BS ac/hs. On Trajenta. Use SSI for elevate BS.   Monitor with increased activity 13. OSA: Encourage use of CPAP.  40. H/o depression: On Zoloft.  15. Constipation: Augmented bowel regimen.  16. Seizure prophylaxis: On keppra 17. Acute lower UTI  UA+, Ucx pending  Empiric Macrobid started 6/5-6/11 18. Hypoalbuminemia  Supplement initiated 6/5  LOS (Days) 1 A FACE TO FACE EVALUATION WAS PERFORMED  Ankit Lorie Phenix 03/11/2017 9:26 AM

## 2017-03-11 NOTE — Progress Notes (Signed)
Patient information reviewed and entered into eRehab system by Subrena Devereux, RN, CRRN, PPS Coordinator.  Information including medical coding and functional independence measure will be reviewed and updated through discharge.    

## 2017-03-11 NOTE — Evaluation (Signed)
Speech Language Pathology Assessment and Plan  Patient Details  Name: Tracy Price MRN: [id number] Date of Birth: [birth year]  SLP Diagnosis: Speech and Language deficits  Rehab Potential: Good ELOS: 7-10 days     Today's Date: 03/11/2017 SLP Individual Time: 1035-1130 SLP Individual Time Calculation (min): 55 min   Problem List:  Patient Active Problem List   Diagnosis Date Noted  . Acute lower UTI   . Hypoalbuminemia due to protein-calorie malnutrition ([place])   . Subdural hemorrhage following injury ([place]) 03/10/2017  . Vascular headache   . Ataxia   . Benign essential HTN   . Acute blood loss anemia   . Stage 3 chronic kidney disease   . Leukocytosis   . Diabetes mellitus type 2 in nonobese (HCC)   . OSA (obstructive sleep apnea)   . Major depressive disorder with single episode   . Constipation   . Seizure prophylaxis   . SDH (subdural hematoma) ([place]) 03/05/2017  . Cerebral aneurysm 03/05/2017  . Family history of heart disease 02/25/2013  . Chest pain 02/25/2013  . Diabetes mellitus without complication ([place])   . Hyperlipidemia   . Hypertension    Past Medical History:  Past Medical History:  Diagnosis Date  . Chest pain   . CKD (chronic kidney disease) stage 3, GFR 30-59 ml/min   . Diabetes mellitus without complication ([place])    type 2  . Hyperlipidemia   . Hypertension   . Sleep apnea 1995   had surgery to correct   Past Surgical History:  Past Surgical History:  Procedure Laterality Date  . CRANIOTOMY Left 03/05/2017   Procedure: LEFT BURR HOLE  HEMATOMA EVACUATION SUBDURAL;  Surgeon: Kary Kos, MD;  Location: [facility];  Service: Neurosurgery;  Laterality: Left;  Marland Kitchen GASTRIC BYPASS  2005    Assessment / Plan / Recommendation Clinical Impression   Tracy Price a 61 y.o.right handed femalewith history of CKD stage III, diabetes mellitus, hypertension, hyperlipidemia with reports of 2 weeks history of waxing and waning of severe headaches and question  of fall prior to onset. History taken from chart review and patient. Brother reported issues with confusion, slurred speech and unsteady gait with decline. She presented to ED 03/05/17 for evaluation and  CT ordered, reviewed, showing SDH.  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 She underwent left-sided burr hole craniectomy for evacuation of SDH by Dr. Saintclair Halsted. Has been treated with Decadron and on Keppra for seizure prophylaxis. Patient with resultant ataxic gait, expressive deficits, left visual field deficits and right sided weakness affecting mobility as well as ability to carry out ADL tasks. CIR recommended for follow up therapy.   Pt admitted to CIR on 03/10/2017.  SLP evaluation completed on 02/08/2017 with the following results: Pt demonstrates mild higher level word finding deficits and impaired written expression with difficulty spelling at the word level. Pt also presents with mild acute versus baseline anticipatory awareness deficits.   As a result, pt would benefit from skilled ST follow up while inpatient in order to maximize functional independence and reduce burden of care prior to discharge.    Skilled Therapeutic Interventions          Cognitive-linguistic evaluation completed with results and recommendations reviewed with patient.  Pt needed min assist-supervision verbal cues for abstract functional word finding at the conversational level.  Pt reports word finding to be much more effortful than prior to admission.  All questions were answered to pt's satisfaction at this time regarding rationale  for ST follow up.  Pt was left in recliner with call bell within reach.       SLP Assessment  Patient will need skilled [place] Pathology Services during CIR admission    Recommendations  Recommendations for Other Services: Neuropsych consult Patient destination: Home Follow up Recommendations: Outpatient SLP Equipment Recommended: None recommended by SLP    SLP  Frequency 3 to 5 out of 7 days   SLP Duration  SLP Intensity  SLP Treatment/Interventions 7-10 days   Minumum of 1-2 x/day, 30 to 90 minutes  Cueing hierarchy;Internal/external aids;Patient/family education;Speech/Language facilitation    Pain Pain Assessment Pain Assessment: 0-10 Pain Score: 4  Faces Pain Scale: Hurts little more Pain Type: Acute pain Pain Location: Head Pain Orientation: Left Pain Descriptors / Indicators: Headache Patients Stated Pain Goal: 0 Pain Intervention(s): RN made aware Multiple Pain Sites: No  Prior Functioning Cognitive/Linguistic Baseline: Within functional limits Type of Home: House  Lives With: Alone Available Help at Discharge: Available PRN/intermittently;Family Vocation: Retired  Function:  Eating Eating                 Cognition Comprehension Comprehension assist level: Follows basic conversation/direction with extra time/assistive device  Expression   Expression assist level: Expresses basic 75 - 89% of the time/requires cueing 10 - 24% of the time. Needs helper to occlude trach/needs to repeat words.  Social Interaction Social Interaction assist level: Interacts appropriately 90% of the time - Needs monitoring or encouragement for participation or interaction.  Problem Solving Problem solving assist level: Solves basic 90% of the time/requires cueing < 10% of the time  Memory Memory assist level: Recognizes or recalls 90% of the time/requires cueing < 10% of the time   Short Term Goals: Week 1: SLP Short Term Goal 1 (Week 1): STG=LTG due to ELOS   Refer to Care Plan for Long Term Goals  Recommendations for other services: Neuropsych  Discharge Criteria: Patient will be discharged from SLP if patient refuses treatment 3 consecutive times without medical reason, if treatment goals not met, if there is a change in medical status, if patient makes no progress towards goals or if patient is discharged from hospital.  The  above assessment, treatment plan, treatment alternatives and goals were discussed and mutually agreed upon: by patient  Tracy Price 03/11/2017, 12:51 PM

## 2017-03-12 ENCOUNTER — Encounter (HOSPITAL_COMMUNITY): Payer: BC Managed Care – PPO | Admitting: Psychology

## 2017-03-12 ENCOUNTER — Inpatient Hospital Stay (HOSPITAL_COMMUNITY): Payer: BC Managed Care – PPO | Admitting: *Deleted

## 2017-03-12 ENCOUNTER — Inpatient Hospital Stay (HOSPITAL_COMMUNITY): Payer: BC Managed Care – PPO | Admitting: Occupational Therapy

## 2017-03-12 ENCOUNTER — Inpatient Hospital Stay (HOSPITAL_COMMUNITY): Payer: BC Managed Care – PPO | Admitting: Speech Pathology

## 2017-03-12 ENCOUNTER — Inpatient Hospital Stay (HOSPITAL_COMMUNITY): Payer: BC Managed Care – PPO | Admitting: Physical Therapy

## 2017-03-12 DIAGNOSIS — R29818 Other symptoms and signs involving the nervous system: Secondary | ICD-10-CM | POA: Insufficient documentation

## 2017-03-12 DIAGNOSIS — R4189 Other symptoms and signs involving cognitive functions and awareness: Secondary | ICD-10-CM

## 2017-03-12 LAB — URINE CULTURE

## 2017-03-12 LAB — GLUCOSE, CAPILLARY
GLUCOSE-CAPILLARY: 106 mg/dL — AB (ref 65–99)
Glucose-Capillary: 146 mg/dL — ABNORMAL HIGH (ref 65–99)
Glucose-Capillary: 102 mg/dL — ABNORMAL HIGH (ref 65–99)
Glucose-Capillary: 155 mg/dL — ABNORMAL HIGH (ref 65–99)

## 2017-03-12 MED ORDER — OXYCODONE HCL 5 MG PO TABS
5.0000 mg | ORAL_TABLET | Freq: Four times a day (QID) | ORAL | Status: DC | PRN
  Administered 2017-03-12: 5 mg via ORAL
  Filled 2017-03-12: qty 1

## 2017-03-12 NOTE — Progress Notes (Signed)
Physical Therapy Session Note  Patient Details  Name: Tracy Price MRN: [id number] Date of Birth: 07-20-1956  Today's Date: 03/12/2017 PT Individual Time: 0906-0948 PT Individual Time Calculation (min): 42 min    Skilled Therapeutic Interventions/Progress Updates:    Session initiated with pt up in room washing hands with nursing providing distant supervision and pt using walker.  Session initiated with pt ambulating approx 50 ft with RW and CGA.  Pt noted to ambulate slowly with short steps.  Session today worked on increasing cadence with procedural technique of having pt set gait speed to therapist count.  Pt was able to successfully perform activity to improve gait speed and step length, however, does demonstrate occasional stumble.  Pt additionally demonstrates decreased dual task capability due to stopping walking when talking.  Session also worked on improving functional independence with sit to stand and mat to chair transfers.  Pt performed both with CGA in blocked practice format with b/l handheld assist for transferring mat to wheelchair.  Following session, pt left up in recliner chair with nursing aware of location and call bell and phone in reach.  Pt denied pain at onset of today's session.  Therapy Documentation Precautions:  Precautions Precautions: Fall Restrictions Weight Bearing Restrictions: No   See Function Navigator for Current Functional Status.   Therapy/Group: Individual Therapy  Jose Corvin Hilario Quarry 03/12/2017, 9:55 AM

## 2017-03-12 NOTE — Consult Note (Signed)
Neuropsychological Consultation   Patient:   Tracy Price   DOB:   August 14, 1956  MR Number:  [id number]  Location:  [facility] [address] [id number] [place] [address] Dept: [phone] UMP: [phone]           Date of Service:   03/12/2017  Start Time:   11 AM End Time:   12 PM  Provider/Observer:  Ilean Skill, Psy.D.       Clinical Neuropsychologist       Billing Code/Service: [id number] 4 Units  Chief Complaint:    Annaleigha Woo is a 61 year old female with chronic Kedney disease, T2D, hyperlipidemia.  She describes having some difficulty for a few weeks before fall.  She reports that she had a fall and hit her head.  She developed gait issues, slurred speech and confusion.  Present to ED on 03/05/2017 and CT showed SDH.  Burr hole craniectomy performed.  She has continued to have expressive language issues (word finding issues), visual scanning deficits, and right side weakness.  She also reports stress and anxiety and having this happen again.  Reason for Service:  Below is the HPI from current admission.  The patient was referred for neuropsychological consultation due to concerns about significant cognitive deficits.    HPI:  Tracy Price a 61 y.o.right handed femalewith history of CKD stage III, diabetes mellitus, hypertension, hyperlipidemia with reports of 2 weeks history of waxing and waning of severe headaches and question of fall prior to onset. History taken from chart review and patient. Brother reported issues with confusion, slurred speech and unsteady gait with decline. She presented to ED 03/05/17 for evaluation and  CT ordered, reviewed, showing SDH.  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 She underwent left-sided burr hole craniectomy for evacuation of SDH by Dr. Saintclair Halsted. Has been treated with Decadron and on Keppra for seizure prophylaxis. Patient with  resultant ataxic gait, expressive deficits, left visual field deficits and right sided weakness affecting mobility as well as ability to carry out ADL tasks. CIR recommended for follow up therapy.  Current Status:  The patient reports that she is starting to see herself get better and is becoming more motivated by this.  The patient reports continued non-fluent aphasia, visual scanning issues and generally slowed information processing speed.  Reliability of Information: Information is derived from one hour face to face interview, review of medical records and direct consultation with treatment team members.    Behavioral Observation: Ariely Riddell  presents as a 61 y.o.-year-old Right Caucasian Female who appeared her stated age. her dress was Appropriate and she was Well Groomed and her manners were Appropriate to the situation.  her participation was indicative of Appropriate behaviors.  There were  physical disabilities noted due to right side weakness.  she displayed an appropriate level of cooperation and motivation.     Interactions:    Active Appropriate  Attention:   abnormal and attention span appeared shorter than expected for age  Memory:   abnormal; remote memory intact, recent memory impaired  Visuo-spatial:  abnormal  This is mostly due to visual scanning issues consistent with other cognitive changes.  Speech (Volume):  normal  Speech:   non-fluent aphasia; slurred  Thought Process:  Tangential  Though Content:  WNL; not suicidal  Orientation:   person, place, time/date and situation  Judgment:   Fair  Planning:   Fair  Affect:  Anxious  Mood:    Anxious  Insight:   Fair  Intelligence:   normal   Medical History:   Past Medical History:  Diagnosis Date  . Chest pain   . CKD (chronic kidney disease) stage 3, GFR 30-59 ml/min   . Diabetes mellitus without complication ([place])    type 2  . Hyperlipidemia   . Hypertension   . Sleep apnea 1995   had  surgery to correct        Psychiatric History:  Patient does reports some issues with anxiety and depression in past, mostly around stressors associated with her medical status.  Family Med/Psych History:  Family History  Problem Relation Age of Onset  . Heart disease Mother   . Diabetes Sister 40       heart attack  . Heart disease Brother     Risk of Suicide/Violence: virtually non-existent Patient denies any SI or HI  Impression/DX:  Tracy Price developed a SDH either before causing a fall or after her fall with presentation to ED on 03/05/17.  Confusion, slurred speech and changes in gait were noted.  She is anxious about her current symptoms but this is at a level appropriate with the current situation. She continues to have left frontal symptoms including expressive language changes, weakness in right side of body, and trouble with visual scanning.  Disposition/Plan:  Will see the patient again next week if needed.  She reports that her symptoms are improving but if anxiety worsens I will consult again.  Diagnosis:    neurocognitive deficits        Electronically Signed   _______________________ Ilean Skill, Psy.D.

## 2017-03-12 NOTE — Progress Notes (Addendum)
[facility] PHYSICAL MEDICINE & REHABILITATION     PROGRESS NOTE  Subjective/Complaints:  Patient seen sitting up in her chair this morning. She states she slept well overnight. She states she is tired from therapies yesterday. She wants know she wash her hair.  ROS: Denies CP, SOB, N/V/D.  Objective: Vital Signs: Blood pressure 118/65, pulse 70, temperature 98.1 F (36.7 C), temperature source Oral, resp. rate 17, weight 73.2 kg (161 lb 6.4 oz), SpO2 100 %. No results found.  Recent Labs  03/11/17 0550  WBC 7.4  HGB 11.8*  HCT 36.4  PLT 307    Recent Labs  03/11/17 0550  NA 137  K 3.6  CL 103  GLUCOSE 102*  BUN 21*  CREATININE 1.16*  CALCIUM 8.9   CBG (last 3)   Recent Labs  03/11/17 2102 03/12/17 0637 03/12/17 1205  GLUCAP 152* 106* 146*    Wt Readings from Last 3 Encounters:  03/10/17 73.2 kg (161 lb 6.4 oz)  03/08/17 72.3 kg (159 lb 6.3 oz)  04/30/16 61.7 kg (136 lb)    Physical Exam:  BP 118/65 (BP Location: Left Arm)   Pulse 70   Temp 98.1 F (36.7 C) (Oral)   Resp 17   Wt 73.2 kg (161 lb 6.4 oz)   SpO2 100%   BMI 31.52 kg/m  Constitutional: She appears well-developed and well-nourished. NAD. HENT: Left crani incisions with staples in place and dry crusted blood.   Eyes: EOMI. No discharge.  Cardiovascular: RRR.  No JVD. Respiratory: Effort normal and breath sounds normal.  GI: Soft. Bowel sounds are normal.  Musculoskeletal: She exhibits no edema or tenderness. Neurological: She is alert and oriented.  Speech clear.  Able to follow one and two step commands without difficulty.  Motor: RUE: 4+/5 proximal to distal RLE: 4+/5 proximal to distal LUE/LLE: 5/5 proximal to distal  Skin: Resolving ecchymosis RUE/RLE  Psychiatric: She has a normal mood and affect. Her behavior is normal. Thought content normal.   Assessment/Plan: 1. Functional deficits secondary to SDH which require 3+ hours per day of interdisciplinary therapy in a  comprehensive inpatient rehab setting. Physiatrist is providing close team supervision and 24 hour management of active medical problems listed below. Physiatrist and rehab team continue to assess barriers to discharge/monitor patient progress toward functional and medical goals.  Function:  Bathing Bathing position   Position: Shower  Bathing parts Body parts bathed by patient: Right arm, Left arm, Chest, Abdomen, Front perineal area, Buttocks, Right upper leg, Left upper leg    Bathing assist Assist Level: Touching or steadying assistance(Pt > 75%)      Upper Body Dressing/Undressing Upper body dressing   What is the patient wearing?: Bra, Pull over shirt/dress Bra - Perfomed by patient: Thread/unthread right bra strap, Thread/unthread left bra strap Bra - Perfomed by helper: Hook/unhook bra (pull down sports bra) Pull over shirt/dress - Perfomed by patient: Thread/unthread left sleeve, Thread/unthread right sleeve, Put head through opening, Pull shirt over trunk          Upper body assist Assist Level: Touching or steadying assistance(Pt > 75%)      Lower Body Dressing/Undressing Lower body dressing   What is the patient wearing?: Underwear, Pants, Socks, Shoes Underwear - Performed by patient: Thread/unthread right underwear leg, Thread/unthread left underwear leg, Pull underwear up/down   Pants- Performed by patient: Thread/unthread right pants leg, Thread/unthread left pants leg, Pull pants up/down, Fasten/unfasten pants       Socks - Performed by  patient: Don/doff right sock, Don/doff left sock   Shoes - Performed by patient: Don/doff right shoe, Don/doff left shoe, Fasten right, Fasten left            Lower body assist Assist for lower body dressing: Touching or steadying assistance (Pt > 75%)      Toileting Toileting   Toileting steps completed by patient: Adjust clothing prior to toileting, Performs perineal hygiene, Adjust clothing after toileting    Toileting Assistive Devices: Grab bar or rail  Toileting assist Assist level: Touching or steadying assistance (Pt.75%)   Transfers Chair/bed transfer   Chair/bed transfer method: Ambulatory Chair/bed transfer assist level: Touching or steadying assistance (Pt > 75%) Chair/bed transfer assistive device:  (Hand held)     Locomotion Ambulation     Max distance:  (57) Assist level: Touching or steadying assistance (Pt > 75%)   Wheelchair          Cognition Comprehension Comprehension assist level: Follows complex conversation/direction with extra time/assistive device  Expression Expression assist level: Expresses basic 90% of the time/requires cueing < 10% of the time.  Social Interaction Social Interaction assist level: Interacts appropriately 90% of the time - Needs monitoring or encouragement for participation or interaction.  Problem Solving Problem solving assist level: Solves basic problems with no assist  Memory Memory assist level: More than reasonable amount of time    Medical Problem List and Plan: 1.  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   Continue CIR 2.  DVT Prophylaxis/Anticoagulation: Mechanical: Sequential compression devices, below knee Bilateral lower extremities 3. Headaches/Pain Management: [place] prn 4. Mood: LCSW to follow for evaluation and support.  5. Neuropsych: This patient is capable of making decisions on her own behalf. 6. Skin/Wound Care: routine pressure relief measures. Maintain adequate nutritional and hydration status.  7. Fluids/Electrolytes/Nutrition: Monitor I/O.  8. HTN: Monitor BP bid. Continue cozaar  Monitor with increased activity 9. ABLA: Monitor serially.   Hb 11.8 on 6/5  Cont to monitor 10. CKD stage 3: Encourage fluid intake.   Cr 1.16 on 6/5  Cont to monitor 11. Leucocytosis: Resolved  Monitor for signs of  infection.  12. T2DM: Hgb A1c- 6.5. Monitor BS ac/hs. On Trajenta. Use SSI for elevate BS.   Relatively controlled on 6/6 13. OSA: Encourage use of CPAP.  24. H/o depression: On Zoloft.  15. Constipation: Augmented bowel regimen.  16. Seizure prophylaxis: On keppra 17. Acute lower UTI  UA+, Ucx with multiple species, reordered  Empiric Macrobid started 6/5-6/11 18. Hypoalbuminemia  Supplement initiated 6/5  LOS (Days) 2 A FACE TO FACE EVALUATION WAS PERFORMED  Ankit Lorie Phenix 03/12/2017 1:34 PM

## 2017-03-12 NOTE — Patient Care Conference (Signed)
Inpatient RehabilitationTeam Conference and Plan of Care Update Date: 03/12/2017   Time: 2:45 PM    Patient Name: Tracy Price      Medical Record Number: [id number]  Date of Birth: [birth year] Sex: Female         Room/Bed: 4W09C/4W09C-01 Payor Info: Payor: [place] / Plan: [place] PPO / Product Type: *No Product type* /    Admitting Diagnosis: Confusion  Admit Date/Time:  03/10/2017  5:16 PM Admission Comments: No comment available   Primary Diagnosis:  <principal problem not specified> Principal Problem: <principal problem not specified>  Patient Active Problem List   Diagnosis Date Noted  . Neurocognitive deficits   . Acute lower UTI   . Hypoalbuminemia due to protein-calorie malnutrition ([place])   . Subdural hemorrhage following injury ([place]) 03/10/2017  . Vascular headache   . Ataxia   . Benign essential HTN   . Acute blood loss anemia   . Stage 3 chronic kidney disease   . Leukocytosis   . Diabetes mellitus type 2 in nonobese (HCC)   . OSA (obstructive sleep apnea)   . Major depressive disorder with single episode   . Constipation   . Seizure prophylaxis   . SDH (subdural hematoma) ([place]) 03/05/2017  . Cerebral aneurysm 03/05/2017  . Family history of heart disease 02/25/2013  . Chest pain 02/25/2013  . Diabetes mellitus without complication ([place])   . Hyperlipidemia   . Hypertension     Expected Discharge Date: Expected Discharge Date: 03/20/17  Team Members Present: Physician leading conference: Dr. Delice Lesch Social Worker Present: Lennart Pall, LCSW Nurse Present: Elliot Cousin, RN PT Present: Kem Parkinson, PT OT Present: Simonne Come, OT;Other (comment) Lou Cal, OT) SLP Present: Windell Moulding, SLP PPS Coordinator present : Daiva Nakayama, RN, CRRN     Current Status/Progress Goal Weekly Team Focus  Medical   Ataxic gait, expressive deficits, left visual field deficits and right sided weakness affecting mobility secondary to  SDH- on Keppra for seizure prophylaxis.  Improve mobility, transfers, tx UTI  See above   Bowel/Bladder   Patient continent of B/B. Voids per BR and LBM 03/11/17.  Patient to remain continent of B/B while on IPR  Continue to assess for toileting needs and assist to BR as needed.   Swallow/Nutrition/ Hydration             ADL's   Min assist with self-care tasks, min guard with RW  Mod I   balance, activity tolerance, RUE strengthening, ADL retraining   Mobility   min A gait, transfers  mod I gait transfers  safety awareness, balance, gait   Communication   mild higher level word finding impairment   mod I   education and carryover of compensatory strategies    Safety/Cognition/ Behavioral Observations  mild anticipatory awareness deficits versus baseline personality   mod I   safety awareness and discharge planning    Pain   Patient denies pain at this time. States pain is <3.  Patient to remain with pain level <3 while on IPR.  Continue to monitor pain level and medicate as needed.   Skin   Patient with two incisions to scalp w/ staples. No other skin issues noted at this time.  Patient to have skin integrity intact while on IPR.  Continue to monitor skin integrity of patient.    Rehab Goals Patient on target to meet rehab goals: Yes *See Care Plan and progress notes for long and short-term goals.  Barriers to Discharge: Mobility, transfers, UTI, DM, HTN, CKD    Possible Resolutions to Barriers:  Therapies, follow labs, follow Ucx    Discharge Planning/Teaching Needs:  Pt plans to d/c home alone.  Hopes to d/c to home in [facility] however, open to considering closer to family.  Teaching needs to be determined.   Team Discussion:  Relatively stable per MD;  Rechecking UA for possible UTI.  Cont b/b and incision healing well.  Supervision/ min assist overall with PT, OT.  Awareness is very mildly impaired but anticipating mod ind goals.  Tx feel she will be safe to be alone at  home but do recommend she remain at her local home where family can assist easier.    Revisions to Treatment Plan:  None   Continued Need for Acute Rehabilitation Level of Care: The patient requires daily medical management by a physician with specialized training in physical medicine and rehabilitation for the following conditions: Daily direction of a multidisciplinary physical rehabilitation program to ensure safe treatment while eliciting the highest outcome that is of practical value to the patient.: Yes Daily medical management of patient stability for increased activity during participation in an intensive rehabilitation regime.: Yes Daily analysis of laboratory values and/or radiology reports with any subsequent need for medication adjustment of medical intervention for : Post surgical problems;Blood pressure problems;Diabetes problems;Renal problems  Marian Meneely, [place] 03/12/2017, 4:14 PM

## 2017-03-12 NOTE — Evaluation (Signed)
Recreational Therapy Assessment and Plan  Patient Details  Name: Tracy Price MRN: [id number] Date of Birth: December 31, 1955 Today's Date: 03/12/2017  Rehab Potential: Good ELOS: 10 days   Assessment Clinical Impression: Problem List:      Patient Active Problem List   Diagnosis Date Noted  . Subdural hemorrhage following injury ([place]) 03/10/2017  . Vascular headache   . Ataxia   . Benign essential HTN   . Acute blood loss anemia   . Stage 3 chronic kidney disease   . Leukocytosis   . Diabetes mellitus type 2 in nonobese (HCC)   . OSA (obstructive sleep apnea)   . Major depressive disorder with single episode   . Constipation   . Seizure prophylaxis   . SDH (subdural hematoma) ([place]) 03/05/2017  . Cerebral aneurysm 03/05/2017  . Family history of heart disease 02/25/2013  . Chest pain 02/25/2013  . Diabetes mellitus without complication ([place])   . Hyperlipidemia   . Hypertension     Past Medical History:      Past Medical History:  Diagnosis Date  . Chest pain   . CKD (chronic kidney disease) stage 3, GFR 30-59 ml/min   . Diabetes mellitus without complication ([place])    type 2  . Hyperlipidemia   . Hypertension   . Sleep apnea 1995   had surgery to correct   Past Surgical History:       Past Surgical History:  Procedure Laterality Date  . CRANIOTOMY Left 03/05/2017   Procedure: LEFT BURR HOLE  HEMATOMA EVACUATION SUBDURAL;  Surgeon: Kary Kos, MD;  Location: [facility];  Service: Neurosurgery;  Laterality: Left;  Marland Kitchen GASTRIC BYPASS  2005    Assessment & Plan Clinical Impression: Patient is a 60 y.o. right handed femalewith history of CKD stage III, diabetes mellitus, hypertension, hyperlipidemia with reports of 2 weeks history of waxing and waning of severe headaches and question of fall prior to onset. History taken from chart review and patient. Brother reported issues with confusion, slurred speech and unsteady gait with decline. She  presented to ED 03/05/17 for evaluation and CT ordered, reviewed, showing SDH. 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 She underwent left-sided burr hole craniectomy for evacuation of SDH by Dr. Saintclair Halsted. Has been treated with Decadron and on Keppra for seizure prophylaxis. Patient with resultant ataxic gait, expressive deficits, left visual field deficits and right sided weakness affecting mobility as well as ability to carry out ADL tasks. CIR recommended for follow up therapy. Patient transferred to CIR on 03/10/2017 .    Pt presents with decreased activity tolerance, decreased functional mobility, decreased balanced, decreased coordination, decreased attention, decreased awareness and decreased safety awareness,  Limiting pt's independence with leisure/community pursuits.   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, agricultural - Teacher, adult education care Other Leisure Interests: Television Leisure Participation Style: With Family/Friends Awareness of Community Resources: Good-identify 3 post discharge leisure resources Psychosocial / Spiritual Patient agreeable to Pet Therapy: Yes Does patient have pets?: Yes Social interaction - Mood/Behavior: Cooperative Academic librarian Appropriate for Education?: Yes Patient Agreeable to Outing?: Yes Recreational Therapy Orientation Orientation -Reviewed with patient: Available activity resources Strengths/Weaknesses Patient Strengths/Abilities: Willingness to participate;Active premorbidly Patient weaknesses: Physical limitations TR Patient demonstrates impairments in the following area(s): Endurance;Motor;Safety  Plan Rec Therapy Plan Is patient appropriate for Therapeutic Recreation?: Yes Rehab Potential: Good Treatment times per week: Min 1 TR session/group >20 minutes during LOS Estimated Length of Stay: 10 days TR Treatment/Interventions:  Adaptive equipment instruction;1:1 session;Balance/vestibular training;Functional mobility training;Community reintegration;Cognitive remediation/compensation;Group participation (Comment);Patient/family education;Therapeutic activities;Recreation/leisure participation;Therapeutic exercise Recommendations for other services: Neuropsych  Recommendations for other services: Neuropsych  Discharge Criteria: Patient will be discharged from TR if patient refuses treatment 3 consecutive times without medical reason.  If treatment goals not met, if there is a change in medical status, if patient makes no progress towards goals or if patient is discharged from hospital.  The above assessment, treatment plan, treatment alternatives and goals were discussed and mutually agreed upon: by patient  [place] 03/12/2017, 3:36 PM

## 2017-03-12 NOTE — Progress Notes (Signed)
Occupational Therapy Session Note  Patient Details  Name: Tracy Price MRN: [id number] Date of Birth: Aug 11, 1956  Today's Date: 03/12/2017 OT Individual Time: [phone] and 1300-1400 OT Individual Time Calculation (min): 60 min and 60 min   Short Term Goals: Week 1:  OT Short Term Goal 1 (Week 1): STG = LTGs due to ELOS  Skilled Therapeutic Interventions/Progress Updates:    1) Treatment session with focus on ADL retraining with functional transfers and use of RUE during bathing and dressing tasks.  Pt received upright in bed reporting sleeping okay but having a headache - RN notified.  Ambulated to bathroom with RW with supervision, completing shower transfer with min guard.  Completed bathing with min guard - supervision during standing balance.  Completed dressing with increased time, only requiring assistance to pull down sports bra over back.  Set up with breakfast tray with focus on functional use of RUE with opening containers and scooping food, utilized built up handle to increase grasp.  2) Treatment session with focus on dynamic standing balance, balance reactions, and functional use of RUE.  Pt ambulated > 150 feet with RW with supervision to therapy gym.  Engaged in [employer] balance activity with focus on acquiring and maintaining midline standing balance as well as weight shifting.  Pt with increased difficulty weight shifting posterior, engaged in reaching activity to further simulate activity with increased success in more functional context.  RUE motor control with sorting colored beads and then stringing beads with increased time.  Engaged in discussion regarding brief anatomy of brain and description of current symptoms and situation.  Provided pt with pictorial examples of current symptoms with pt reporting increased awareness and understanding.  Pt will benefit from continued education.  Therapy Documentation Precautions:  Precautions Precautions: Fall Restrictions Weight  Bearing Restrictions: No General:   Vital Signs: Therapy Vitals Temp: 98.1 F (36.7 C) Temp Source: Oral Pulse Rate: 80 Resp: 18 BP: 120/70 Patient Position (if appropriate): Sitting Oxygen Therapy SpO2: 100 % O2 Device: Not Delivered Pain:  Pt with c/o headache.  RN arrived during AM session to provide pain meds  See Function Navigator for Current Functional Status.   Therapy/Group: Individual Therapy  Simonne Come 03/12/2017, 4:09 PM

## 2017-03-12 NOTE — Progress Notes (Signed)
Speech Language Pathology Daily Session Note  Patient Details  Name: Cynithia Hakimi MRN: [id number] Date of Birth: 12-17-1955  Today's Date: 03/12/2017 SLP Individual Time: 1033-1100 SLP Individual Time Calculation (min): 27 min  Short Term Goals: Week 1: SLP Short Term Goal 1 (Week 1): STG=LTG due to ELOS   Skilled Therapeutic Interventions:  Pt was seen for skilled ST targeting cognitive-linguistic goals.  SLP facilitated the session with a verbal description task targeting use of word finding strategies.  Pt needed supervision for word finding of abstract, complex concepts and presented with good insight into task difficulty.  Pt reported that she was going on an outing tomorrow and verbalized appropriate anticipatory awareness of potential obstacles in the community with mod I.  Pt was left in recliner with call bell within reach.  Continue per current plan of care.       Function:  Eating Eating                 Cognition Comprehension Comprehension assist level: Follows complex conversation/direction with extra time/assistive device  Expression   Expression assist level: Expresses basic 90% of the time/requires cueing < 10% of the time.  Social Interaction Social Interaction assist level: Interacts appropriately 90% of the time - Needs monitoring or encouragement for participation or interaction.  Problem Solving Problem solving assist level: Solves basic problems with no assist  Memory Memory assist level: More than reasonable amount of time    Pain Pain Assessment Pain Assessment: No/denies pain  Therapy/Group: Individual Therapy  Ram Haugan, Elmyra Ricks L 03/12/2017, 11:00 AM

## 2017-03-13 ENCOUNTER — Inpatient Hospital Stay (HOSPITAL_COMMUNITY): Payer: BC Managed Care – PPO | Admitting: Occupational Therapy

## 2017-03-13 ENCOUNTER — Inpatient Hospital Stay (HOSPITAL_COMMUNITY): Payer: BC Managed Care – PPO | Admitting: Speech Pathology

## 2017-03-13 ENCOUNTER — Encounter (HOSPITAL_COMMUNITY): Payer: BC Managed Care – PPO | Admitting: Physical Therapy

## 2017-03-13 DIAGNOSIS — E669 Obesity, unspecified: Secondary | ICD-10-CM

## 2017-03-13 DIAGNOSIS — G8929 Other chronic pain: Secondary | ICD-10-CM

## 2017-03-13 DIAGNOSIS — E1169 Type 2 diabetes mellitus with other specified complication: Secondary | ICD-10-CM

## 2017-03-13 DIAGNOSIS — M25511 Pain in right shoulder: Secondary | ICD-10-CM

## 2017-03-13 LAB — GLUCOSE, CAPILLARY
GLUCOSE-CAPILLARY: 157 mg/dL — AB (ref 65–99)
GLUCOSE-CAPILLARY: 105 mg/dL — AB (ref 65–99)
GLUCOSE-CAPILLARY: 127 mg/dL — AB (ref 65–99)
Glucose-Capillary: 120 mg/dL — ABNORMAL HIGH (ref 65–99)

## 2017-03-13 LAB — URINE CULTURE: Culture: <10000 — AB

## 2017-03-13 MED ORDER — OXYCODONE HCL 5 MG PO TABS
5.0000 mg | ORAL_TABLET | Freq: Four times a day (QID) | ORAL | Status: DC | PRN
  Administered 2017-03-18: 5 mg via ORAL
  Filled 2017-03-13 (×2): qty 1

## 2017-03-13 MED ORDER — DICLOFENAC SODIUM 50 MG PO TBEC
50.0000 mg | DELAYED_RELEASE_TABLET | Freq: Two times a day (BID) | ORAL | Status: DC | PRN
  Administered 2017-03-13 – 2017-03-20 (×13): 50 mg via ORAL
  Filled 2017-03-13 (×17): qty 1

## 2017-03-13 NOTE — Progress Notes (Signed)
Recreational Therapy Session Note  Patient Details  Name: Tracy Price MRN: [id number] Date of Birth: 1956/09/07 Today's Date: 03/13/2017  Pain: no c/o Skilled Therapeutic Interventions/Progress Updates: Pt participated in community reintegration/outing to [employer] at overall close supervision-contact guard assist ambulatory level using RW.  Goals focused on safe community mobility, identification & negotiation of obstacles, accessing public restroom, energy conservation techniques/education.  See outing goal sheet in shadow chart for full details.   Therapy/Group: [employer]   Makyla Bye 03/13/2017, 8:53 AM

## 2017-03-13 NOTE — Progress Notes (Signed)
[place] PHYSICAL MEDICINE & REHABILITATION     PROGRESS NOTE  Subjective/Complaints:  Pt seen sitting up in her chair this AM, working with OT.  She slept fairly overnight.  She has questions about when she can return to driving and requests change in pain meds.   ROS: Denies CP, SOB, N/V/D.  Objective: Vital Signs: Blood pressure 122/72, pulse 78, temperature 98 F (36.7 C), temperature source Oral, resp. rate 18, weight 73.2 kg (161 lb 6.4 oz), SpO2 100 %. No results found.  Recent Labs  03/11/17 0550  WBC 7.4  HGB 11.8*  HCT 36.4  PLT 307    Recent Labs  03/11/17 0550  NA 137  K 3.6  CL 103  GLUCOSE 102*  BUN 21*  CREATININE 1.16*  CALCIUM 8.9   CBG (last 3)   Recent Labs  03/12/17 1656 03/12/17 2051 03/13/17 0638  GLUCAP 102* 155* 120*    Wt Readings from Last 3 Encounters:  03/10/17 73.2 kg (161 lb 6.4 oz)  03/08/17 72.3 kg (159 lb 6.3 oz)  04/30/16 61.7 kg (136 lb)    Physical Exam:  BP 122/72 (BP Location: Left Arm)   Pulse 78   Temp 98 F (36.7 C) (Oral)   Resp 18   Wt 73.2 kg (161 lb 6.4 oz)   SpO2 100%   BMI 31.52 kg/m  Constitutional: She appears well-developed and well-nourished. NAD. HENT: Left crani incisions with staples in place and dry crusted blood.   Eyes: EOMI. No discharge.  Cardiovascular: RRR.  No JVD. Respiratory: Effort normal and breath sounds normal.  GI: Soft. Bowel sounds are normal.  Musculoskeletal: She exhibits no edema or tenderness. Neurological: She is alert and oriented.  Speech clear.  Able to follow one and two step commands without difficulty.  Motor: RUE: 4+/5 proximal to distal (stable) RLE: 4+/5 proximal to distal LUE/LLE: 5/5 proximal to distal  Skin: Resolving ecchymosis RUE/RLE  Psychiatric: She has a normal mood and affect. Her behavior is normal. Thought content normal.   Assessment/Plan: 1. Functional deficits secondary to SDH which require 3+ hours per day of interdisciplinary therapy in a  comprehensive inpatient rehab setting. Physiatrist is providing close team supervision and 24 hour management of active medical problems listed below. Physiatrist and rehab team continue to assess barriers to discharge/monitor patient progress toward functional and medical goals.  Function:  Bathing Bathing position   Position: Shower  Bathing parts Body parts bathed by patient: Right arm, Left arm, Chest, Abdomen, Front perineal area, Buttocks, Right upper leg, Left upper leg, Back    Bathing assist Assist Level: Supervision or verbal cues      Upper Body Dressing/Undressing Upper body dressing   What is the patient wearing?: Bra, Pull over shirt/dress Bra - Perfomed by patient: Thread/unthread right bra strap, Thread/unthread left bra strap, Hook/unhook bra (pull down sports bra) Bra - Perfomed by helper: Hook/unhook bra (pull down sports bra) Pull over shirt/dress - Perfomed by patient: Thread/unthread left sleeve, Thread/unthread right sleeve, Put head through opening, Pull shirt over trunk          Upper body assist Assist Level: Supervision or verbal cues      Lower Body Dressing/Undressing Lower body dressing   What is the patient wearing?: Underwear, Pants, Socks, Shoes Underwear - Performed by patient: Thread/unthread right underwear leg, Thread/unthread left underwear leg, Pull underwear up/down   Pants- Performed by patient: Thread/unthread right pants leg, Thread/unthread left pants leg, Pull pants up/down, Fasten/unfasten pants  Socks - Performed by patient: Don/doff right sock, Don/doff left sock   Shoes - Performed by patient: Don/doff right shoe, Don/doff left shoe, Fasten right, Fasten left            Lower body assist Assist for lower body dressing: Supervision or verbal cues      Toileting Toileting   Toileting steps completed by patient: Adjust clothing prior to toileting, Performs perineal hygiene, Adjust clothing after toileting    Toileting Assistive Devices: Grab bar or rail  Toileting assist Assist level: Touching or steadying assistance (Pt.75%)   Transfers Chair/bed transfer   Chair/bed transfer method: Ambulatory Chair/bed transfer assist level: Touching or steadying assistance (Pt > 75%) (without RW) Chair/bed transfer assistive device:  (Hand held)     Locomotion Ambulation     Max distance:  (57) Assist level: Touching or steadying assistance (Pt > 75%)   Wheelchair          Cognition Comprehension Comprehension assist level: Follows complex conversation/direction with extra time/assistive device  Expression Expression assist level: Expresses basic 90% of the time/requires cueing < 10% of the time.  Social Interaction Social Interaction assist level: Interacts appropriately 90% of the time - Needs monitoring or encouragement for participation or interaction.  Problem Solving Problem solving assist level: Solves basic problems with no assist  Memory Memory assist level: More than reasonable amount of time    Medical Problem List and Plan: 1.  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   Continue CIR 2.  DVT Prophylaxis/Anticoagulation: Mechanical: Sequential compression devices, below knee Bilateral lower extremities 3. Headaches/Pain Management: Diclofenac, [place] prn 4. Mood: LCSW to follow for evaluation and support.  5. Neuropsych: This patient is capable of making decisions on her own behalf. 6. Skin/Wound Care: routine pressure relief measures. Maintain adequate nutritional and hydration status.  7. Fluids/Electrolytes/Nutrition: Monitor I/O.  8. HTN: Monitor BP bid. Continue cozaar  Monitor with increased activity  Controlled 6/7 9. ABLA: Monitor serially.   Hb 11.8 on 6/5  Cont to monitor 10. CKD stage 3: Encourage fluid intake.   Cr 1.16 on 6/5  Cont to monitor 11.  Leucocytosis: Resolved  Monitor for signs of infection.  12. T2DM: Hgb A1c- 6.5. Monitor BS ac/hs. On Trajenta. Use SSI for elevate BS.   Relatively controlled on 6/7 13. OSA: Encourage use of CPAP.  62. H/o depression: On Zoloft.  15. Constipation: Augmented bowel regimen.  16. Seizure prophylaxis: On keppra 17. Acute lower UTI  UA+, Ucx with multiple species, reculture pending  Empiric Macrobid started 6/5-6/11 18. Hypoalbuminemia  Supplement initiated 6/5 19. Obesity  Body mass index is 31.52 kg/m.  Diet and exercise education  Encourage weight loss to increase endurance and promote overall health   LOS (Days) 3 A FACE TO FACE EVALUATION WAS PERFORMED  Diedra Sinor Lorie Phenix 03/13/2017 10:01 AM

## 2017-03-13 NOTE — Progress Notes (Signed)
Occupational Therapy Session Note  Patient Details  Name: Tracy Price MRN: [id number] Date of Birth: 1956-09-26  Today's Date: 03/13/2017 OT Individual Time: 3500-9381 and 1300-1330 OT Individual Time Calculation (min): 45 min and 30 min   Short Term Goals: Week 1:  OT Short Term Goal 1 (Week 1): STG = LTGs due to ELOS  Skilled Therapeutic Interventions/Progress Updates:    1) Treatment session with focus on functional mobility and RUE use during self-care tasks.  Pt ambulated to room shower without AD with min guard and slow, unsteady gait.  Completed bathing at sit > stand level with supervision.  Dressing completed at sit > stand level from recliner with increased time and ability to pull down sports bra over back this session.  Engaged in prolonged discussion regarding recommendation to stay in [place] area either with family or in mother's home due to doctors and family in this area.  Discussed plan to engage in cooking task.  Provided pt with pen and paper to assist with memory strategies by writing things down.    2) Treatment session with focus on functional mobility, dynamic standing balance, and visual scanning.  Pt ambulated to Dynavision with RW with supervision.  Engaged in 3 trials of Dynavision in standing with focus on trunk rotation and reaching outside BOS to challenge balance and balance reactions.  Pt with delayed reaction in Lt visual field, initially thought due to use of RUE only but then persisted even with use of BUE.  Discussed reaction times of 2.67, 2.22, and 1.79 in relation to functional tasks to improve awareness of impaired reaction time and pt concerns with driving and meal prep.  Discussed plan for meal prep next week and planned a meal to conduct to further assess and address awareness and safety in home environment.  Issued RW bag to increase safety and hand placement when utilizing RW and attempting to transport items.  Therapy Documentation Precautions:   Precautions Precautions: Fall Restrictions Weight Bearing Restrictions: No General:   Vital Signs: Therapy Vitals Temp: 98 F (36.7 C) Temp Source: Oral Pulse Rate: 78 Resp: 18 BP: 122/72 Patient Position (if appropriate): Lying Oxygen Therapy SpO2: 100 % O2 Device: Not Delivered Pain:  Pt with c/o headache - RN arrived at end of session with pain meds  See Function Navigator for Current Functional Status.   Therapy/Group: Individual Therapy  Simonne Come 03/13/2017, 8:52 AM

## 2017-03-13 NOTE — Progress Notes (Signed)
Social Work  Social Work Assessment and Plan  Patient Details  Name: Tracy Price MRN: [id number] Date of Birth: 12-29-1955  Today's Date: 03/13/2017  Problem List:  Patient Active Problem List   Diagnosis Date Noted  . Chronic right shoulder pain   . Diabetes mellitus type 2 in obese ([place])   . Neurocognitive deficits   . Acute lower UTI   . Hypoalbuminemia due to protein-calorie malnutrition ([place])   . Subdural hemorrhage following injury ([place]) 03/10/2017  . Vascular headache   . Ataxia   . Benign essential HTN   . Acute blood loss anemia   . Stage 3 chronic kidney disease   . Leukocytosis   . Diabetes mellitus type 2 in nonobese ([facility])   . OSA (obstructive sleep apnea)   . Major depressive disorder with single episode   . Constipation   . Seizure prophylaxis   . SDH (subdural hematoma) ([place]) 03/05/2017  . Cerebral aneurysm 03/05/2017  . Family history of heart disease 02/25/2013  . Chest pain 02/25/2013  . Diabetes mellitus without complication ([place])   . Hyperlipidemia   . Hypertension    Past Medical History:  Past Medical History:  Diagnosis Date  . Chest pain   . CKD (chronic kidney disease) stage 3, GFR 30-59 ml/min   . Diabetes mellitus without complication ([place])    type 2  . Hyperlipidemia   . Hypertension   . Sleep apnea 1995   had surgery to correct   Past Surgical History:  Past Surgical History:  Procedure Laterality Date  . CRANIOTOMY Left 03/05/2017   Procedure: LEFT BURR HOLE  HEMATOMA EVACUATION SUBDURAL;  Surgeon: Kary Kos, MD;  Location: [facility];  Service: Neurosurgery;  Laterality: Left;  Marland Kitchen GASTRIC BYPASS  2005   Social History:  reports that she has never smoked. She has never used smokeless tobacco. She reports that she does not drink alcohol or use drugs.  Family / Support Systems Marital Status: Single Patient Roles: Other (Comment) (has a brother) Other Supports: brother, Tracy Price [facility]) @ (H) [phone];  neice, Tracy Price  [facility]) Anticipated Caregiver: self Ability/Limitations of Caregiver: Brother is retired, not working, brother lives locally. Caregiver Availability: Intermittent Family Dynamics: Pt notes she has a good relationship with brother and other local family, however, reluctant to place any caregiver burden on them.    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  She talks easily with me but does complain that she "...feel like I'm not talking right..."  Speech clear but a little halted at times.  Pt expresses frustration with her fall, SDH and resulting limitations.  States, "I just can't believe this happened to me!"  She expresses concern about being able to regain her prior independence.  Have referred for neuropsychology for further support. Recent Psychosocial Issues: Mother died 2 yrs ago and pt had been her caregiver for ~10 yrs.  following soon after her mother's death, pt's sister died unexpectedly with sepsis. Pyschiatric History: None Substance Abuse History: None  Patient / Family Perceptions, Expectations & Goals Pt/Family understanding of  illness & functional limitations: Pt has a good, general understanding of her SDH, surgery performed and current functional limitations/ need for CIR. Premorbid pt/family roles/activities: Pt was completely independent and was in process of preparing the sale of her home in [facility]. Anticipated changes in  roles/activities/participation: Dependent on being able to reach mod ind goals, pt's brother and neice may need to assume some caregiver support duties. Pt/family expectations/goals: "I want to be able to do things for myself."  [employer]: None Premorbid Home Care/DME Agencies: None Transportation available at discharge: yes Resource referrals recommended: Neuropsychology, Support group (specify)  Discharge Planning Living Arrangements: Alone Support Systems: Other relatives, Friends/neighbors Type of Residence: Private residence Insurance Resources: Multimedia programmer (specify) (BCBS state plan) Financial Resources: Other (Comment) (State retirement) Financial Screen Referred: No Living Expenses: Own Money Management: Patient Does the patient have any problems obtaining your medications?: No Home Management: pt  Patient/Family Preliminary Plans: Pt hopes to be able to return to her home in [facility], however, she does have option of staying at her mother's home locally if team recommends being closer to family. Social Work Anticipated Follow Up Needs: HH/OP Expected length of stay: 7-10 days  Clinical Impression Unfortunate woman here following a fall and resulting SDH.  She is familiar to myself and CIR as her mother was a patient here x2.  She completes assessment interview without much difficulty and denies any significant emotional distress but is very "frustrated" with her situation.  Referred for neuropsychology consult to provide additional support.  Pt goal is to be able to return to her home and open to considering staying locally at her mother's home where family more able to support.  Will follow for d/c planning and support.  Tracy Price 03/13/2017, 11:04 AM

## 2017-03-13 NOTE — Progress Notes (Signed)
Physical Therapy Session Note  Patient Details  Name: Kyara Boxer MRN: [id number] Date of Birth: 01-20-1956  Today's Date: 03/13/2017 PT Concurrent Time:  1000- 1130  Total time 90 minutes  Short Term Goals: Week 1:  PT Short Term Goal 1 (Week 1): STG=LTG  Skilled Therapeutic Interventions/Progress Updates:    Pt participated in community reintegration activity to [employer]. Goals achieved as outlined in shadow chart. Activity included ambulation over even/uneven surfaces, in/out of vehicle, stairs, transfers - activity performed at close supervision-contact guard assist using RW. Session also included safe community mobility, identification & negotiation of obstacles, accessing public restroom, energy conservation techniques/education.    Therapy Documentation Precautions:  Precautions Precautions: Fall Restrictions Weight Bearing Restrictions: No  Pain: 4-5/10 - monitored during session.   See Function Navigator for Current Functional Status.   Therapy/Group: community integration  Linard Millers, PT 03/13/2017, 4:20 PM

## 2017-03-13 NOTE — Progress Notes (Signed)
Speech Language Pathology Daily Session Note  Patient Details  Name: Sharese Manrique MRN: [id number] Date of Birth: 02-08-1956  Today's Date: 03/13/2017 SLP Individual Time: 8833-7445 SLP Individual Time Calculation (min): 45 min  Short Term Goals: Week 1: SLP Short Term Goal 1 (Week 1): STG=LTG due to ELOS   Skilled Therapeutic Interventions:  Pt was seen for skilled ST targeting communication goals. Pt was mod I for word finding during functional conversations with SLP regarding discharge planning.  Pt feels she is near her baseline for communication but may benefit from 1 additional ST session to ensure consistency of strategy use.  Pt was returned to room and left in recliner with call bell within reach.  Continue per current plan of care.       Function:  Eating Eating                 Cognition Comprehension Comprehension assist level: Follows complex conversation/direction with extra time/assistive device  Expression   Expression assist level: Expresses complex ideas: With extra time/assistive device  Social Interaction Social Interaction assist level: Interacts appropriately with others with medication or extra time (anti-anxiety, antidepressant).  Problem Solving Problem solving assist level: Solves basic problems with no assist  Memory Memory assist level: More than reasonable amount of time    Pain Pain Assessment Pain Assessment: No/denies pain  Therapy/Group: Individual Therapy  Khyler Urda, Selinda Orion 03/13/2017, 9:52 PM

## 2017-03-13 NOTE — Care Management Note (Signed)
Inpatient [facility] Individual Statement of Services  Patient Name:  Tracy Price  Date:  03/13/2017  Welcome to the [facility].  Our goal is to provide you with an individualized program based on your diagnosis and situation, designed to meet your specific needs.  With this comprehensive rehabilitation program, you will be expected to participate in at least 3 hours of rehabilitation therapies Monday-Friday, with modified therapy programming on the weekends.  Your rehabilitation program will include the following services:  Physical Therapy (PT), Occupational Therapy (OT), Speech Therapy (ST), 24 hour per day rehabilitation nursing, Therapeutic Recreaction (TR), Neuropsychology, Case Management (Social Worker), Rehabilitation Medicine, Nutrition Services and Pharmacy Services  Weekly team conferences will be held on Wednesdays to discuss your progress.  Your Social Worker will talk with you frequently to get your input and to update you on team discussions.  Team conferences with you and your family in attendance may also be held.  Expected length of stay: 10 days  Overall anticipated outcome: modified indepednent  Depending on your progress and recovery, your program may change. Your Social Worker will coordinate services and will keep you informed of any changes. Your Social Worker's name and contact numbers are listed  below.  The following services may also be recommended but are not provided by the [facility] will be made to provide these services after discharge if needed.  Arrangements include referral to agencies that provide these services.  Your insurance has been verified to be:  [place] Your primary doctor is:  Jenna Luo  Pertinent information will be shared with your doctor and your insurance  company.  Social Worker:  [place], [place] or ([phone]   Information discussed with and copy given to patient by: Lennart Pall, 03/13/2017, 11:09 AM

## 2017-03-14 ENCOUNTER — Inpatient Hospital Stay (HOSPITAL_COMMUNITY): Payer: BC Managed Care – PPO | Admitting: Physical Therapy

## 2017-03-14 ENCOUNTER — Inpatient Hospital Stay (HOSPITAL_COMMUNITY): Payer: BC Managed Care – PPO | Admitting: Occupational Therapy

## 2017-03-14 ENCOUNTER — Inpatient Hospital Stay (HOSPITAL_COMMUNITY): Payer: BC Managed Care – PPO | Admitting: Speech Pathology

## 2017-03-14 LAB — GLUCOSE, CAPILLARY
GLUCOSE-CAPILLARY: 116 mg/dL — AB (ref 65–99)
GLUCOSE-CAPILLARY: 173 mg/dL — AB (ref 65–99)
GLUCOSE-CAPILLARY: 139 mg/dL — AB (ref 65–99)
Glucose-Capillary: 119 mg/dL — ABNORMAL HIGH (ref 65–99)

## 2017-03-14 MED ORDER — METHOCARBAMOL 500 MG PO TABS
500.0000 mg | ORAL_TABLET | Freq: Four times a day (QID) | ORAL | Status: DC | PRN
  Administered 2017-03-14 – 2017-03-18 (×5): 500 mg via ORAL
  Filled 2017-03-14 (×6): qty 1

## 2017-03-14 NOTE — Progress Notes (Signed)
[facility] PHYSICAL MEDICINE & REHABILITATION     PROGRESS NOTE  Subjective/Complaints:  Pt seen working with OT this AM.  She slept well overnight.  She notes spasms yesterday evening and was not aware of medication she could take.   ROS: Denies CP, SOB, N/V/D.  Objective: Vital Signs: Blood pressure 120/68, pulse 65, temperature 98 F (36.7 C), temperature source Oral, resp. rate 18, weight 73.2 kg (161 lb 6.4 oz), SpO2 99 %. No results found. No results for input(s): WBC, HGB, HCT, PLT in the last 72 hours. No results for input(s): NA, K, CL, GLUCOSE, BUN, CREATININE, CALCIUM in the last 72 hours.  Invalid input(s): CO CBG (last 3)   Recent Labs  03/13/17 1651 03/13/17 2035 03/14/17 0703  GLUCAP 105* 127* 116*    Wt Readings from Last 3 Encounters:  03/10/17 73.2 kg (161 lb 6.4 oz)  03/08/17 72.3 kg (159 lb 6.3 oz)  04/30/16 61.7 kg (136 lb)    Physical Exam:  BP 120/68 (BP Location: Left Arm)   Pulse 65   Temp 98 F (36.7 C) (Oral)   Resp 18   Wt 73.2 kg (161 lb 6.4 oz)   SpO2 99%   BMI 31.52 kg/m  Constitutional: She appears well-developed and well-nourished. NAD. HENT: Left crani incisions with staples in place and dry crusted blood.   Eyes: EOMI. No discharge.  Cardiovascular: RRR.  No JVD. Respiratory: Effort normal and breath sounds normal.  GI: Soft. Bowel sounds are normal.  Musculoskeletal: She exhibits no edema or tenderness. Neurological: She is alert and oriented.  Speech clear.  Able to follow one and two step commands without difficulty.  Motor: RUE: 4+/5 proximal to distal (stable) RLE: 4+/5 proximal to distal (stable) LUE/LLE: 5/5 proximal to distal  Skin: Resolving ecchymosis RUE/RLE  Psychiatric: She has a normal mood and affect. Her behavior is normal. Thought content normal.   Assessment/Plan: 1. 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 Physiatrist is providing close team supervision and 24 hour management of active medical problems listed below. Physiatrist and rehab team continue to assess barriers to discharge/monitor patient progress toward functional and medical goals.  Function:  Bathing Bathing position   Position: Shower  Bathing parts Body parts bathed by patient: Right arm, Left arm, Chest, Abdomen, Front perineal area, Buttocks, Right upper leg, Left upper leg, Back    Bathing assist Assist Level: Supervision or verbal cues      Upper Body Dressing/Undressing Upper body dressing   What is the patient wearing?: Bra, Pull over shirt/dress Bra - Perfomed by patient: Thread/unthread right bra strap, Thread/unthread left bra strap Bra - Perfomed by helper: Hook/unhook bra (pull down sports bra) Pull over shirt/dress - Perfomed by patient: Thread/unthread left sleeve, Thread/unthread right sleeve, Put head through opening, Pull shirt over trunk          Upper body assist Assist Level: Touching or steadying assistance(Pt > 75%)      Lower Body Dressing/Undressing Lower body dressing   What is the patient wearing?: Underwear, Pants, Socks, Shoes Underwear - Performed by patient: Thread/unthread right underwear leg, Thread/unthread left underwear leg, Pull underwear up/down   Pants- Performed by patient: Thread/unthread right pants leg, Thread/unthread left pants leg, Pull pants up/down, Fasten/unfasten pants       Socks - Performed by patient: Don/doff right sock, Don/doff left sock   Shoes - Performed by patient: Don/doff right shoe,  Don/doff left shoe, Fasten right, Fasten left            Lower body assist Assist for lower body dressing: Supervision or verbal cues, Set up   Set up : To obtain clothing/put away  Toileting Toileting   Toileting steps completed by patient: Adjust clothing prior to toileting, Performs perineal hygiene, Adjust clothing after toileting   Toileting Assistive  Devices: Grab bar or rail  Toileting assist Assist level: No help/no cues   Transfers Chair/bed transfer   Chair/bed transfer method: Ambulatory Chair/bed transfer assist level: Touching or steadying assistance (Pt > 75%) (without RW) Chair/bed transfer assistive device:  (Hand held)     Locomotion Ambulation     Max distance: 160 ft Assist level: Touching or steadying assistance (Pt > 75%)   Wheelchair          Cognition Comprehension Comprehension assist level: Follows complex conversation/direction with extra time/assistive device  Expression Expression assist level: Expresses basic 90% of the time/requires cueing < 10% of the time.  Social Interaction Social Interaction assist level: Interacts appropriately 90% of the time - Needs monitoring or encouragement for participation or interaction.  Problem Solving Problem solving assist level: Solves basic problems with no assist  Memory Memory assist level: More than reasonable amount of time    Medical Problem List and Plan: 1.  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   Continue CIR 2.  DVT Prophylaxis/Anticoagulation: Mechanical: Sequential compression devices, below knee Bilateral lower extremities 3. Headaches/Pain Management: Diclofenac, [place] prn 4. Mood: LCSW to follow for evaluation and support.  5. Neuropsych: This patient is capable of making decisions on her own behalf. 6. Skin/Wound Care: routine pressure relief measures. Maintain adequate nutritional and hydration status.  7. Fluids/Electrolytes/Nutrition: Monitor I/O.  8. HTN: Monitor BP bid. Continue cozaar  Monitor with increased activity  Controlled 6/8 9. ABLA: Monitor serially.   Hb 11.8 on 6/5  Labs ordered for monday  Cont to monitor 10. CKD stage 3: Encourage fluid intake.   Cr 1.16 on 6/5  Labs ordered for monday  Cont to  monitor 11. Leucocytosis: Resolved  Monitor for signs of infection.  12. T2DM: Hgb A1c- 6.5. Monitor BS ac/hs. On Trajenta. Use SSI for elevate BS.   Relatively controlled on 6/8 13. OSA: Encourage use of CPAP.  88. H/o depression: On Zoloft.  15. Constipation: Augmented bowel regimen.  16. Seizure prophylaxis: On keppra 17. Acute lower UTI  UA+, Ucx with multiple species, reculture with insig growth  Complete empiric Macrobid started 6/5-6/11 18. Hypoalbuminemia  Supplement initiated 6/5 19. Obesity  Body mass index is 31.52 kg/m.  Diet and exercise education  Encourage weight loss to increase endurance and promote overall health   LOS (Days) 4 A FACE TO FACE EVALUATION WAS PERFORMED  Ankit Lorie Phenix 03/14/2017 8:49 AM

## 2017-03-14 NOTE — Progress Notes (Signed)
Speech Language Pathology Discharge Summary  Patient Details  Name: Tracy Price MRN: [id number] Date of Birth: 07-04-1956  Today's Date: 03/14/2017 SLP Individual Time: 1130-1200 SLP Individual Time Calculation (min): 30 min   Skilled Therapeutic Interventions:  Pt was seen for skilled ST targeting communication goals.  SLP facilitated the session with ongoing diagnostic treatment of written expression given deficits noted on initial evaluation.  Impairments seems to have largely resolved and pt's written expression was [facility] at the word, sentence, and paragraph level during all tasks assessed.  Pt was also mod I for functional word finding during conversations with SLP.  As a result, no further ST needs are indicated at this time.       Patient has met 2 of 2 long term goals.  Patient to discharge at overall Modified Independent level.  Reasons goals not met:     Clinical Impression/Discharge Summary:  Pt made functional gains and is discharging from [facility] services having met 2 out of 2 long term goals.  Pt is discharging at an overall mod I level for cognitive-linguistic function.  Pt education is complete at this time.  Given that pt has met her goals, no further ST needs are indicated at this time.    Recommendation:  None      Reasons for discharge: Treatment goals met   Patient/Family Agrees with Progress Made and Goals Achieved: Yes   Function:  Eating Eating                 Cognition Comprehension Comprehension assist level: Follows complex conversation/direction with extra time/assistive device  Expression   Expression assist level: Expresses complex ideas: With extra time/assistive device  Social Interaction Social Interaction assist level: Interacts appropriately with others with medication or extra time (anti-anxiety, antidepressant).  Problem Solving Problem solving assist level: Solves basic problems with no assist  Memory Memory assist level: More than reasonable  amount of time   Emilio Math 03/14/2017, 12:31 PM

## 2017-03-14 NOTE — Progress Notes (Signed)
Occupational Therapy Session Note  Patient Details  Name: Tracy Price MRN: [id number] Date of Birth: 06-Aug-1956  Today's Date: 03/14/2017 OT Individual Time: 1275-1700 and 1305-1330 OT Individual Time Calculation (min): 45 min and 25 min   Short Term Goals: Week 1:  OT Short Term Goal 1 (Week 1): STG = LTGs due to ELOS  Skilled Therapeutic Interventions/Progress Updates:    1) Treatment session with focus on functional mobility without RW and dynamic standing balance during bathing and dressing tasks.  Pt ambulated to room shower without RW with close supervision, one LOB when exiting bathroom requiring min assist to correct.  Completed bathing at sit > stand level in room shower without assist.  Dressing completed without assist, increased time to pull down sports bra and don socks.  Grooming completed in standing without assist with use of RUE.  Ambulated approx 100 feet without RW with cues for increased upright posture for improved gait speed and balance.  2) Treatment session with focus on dynamic standing balance, trunk control, and functional use of RUE.  Pt ambulated to therapy gym with RW supervision.  Engaged in standing activity with focus on trunk rotation to Rt to obtain items to complete table top task.  Engaged in ball toss in standing with focus on weight shifting and trunk rotation.  Pt left seated edge of mat in therapy gym, awaiting PT.  Therapy Documentation Precautions:  Precautions Precautions: Fall Restrictions Weight Bearing Restrictions: No General:   Vital Signs: Therapy Vitals Temp: 98 F (36.7 C) Temp Source: Oral Pulse Rate: 65 Resp: 18 BP: 120/68 Patient Position (if appropriate): Lying Oxygen Therapy SpO2: 99 % O2 Device: Not Delivered Pain:  Pt with c/o headache.  RN notified  See Function Navigator for Current Functional Status.   Therapy/Group: Individual Therapy  Simonne Come 03/14/2017, 7:56 AM

## 2017-03-14 NOTE — Progress Notes (Signed)
Social Work Patient ID: Tracy Price, female   DOB: [birth year], 61 y.o.   MRN: [id number]   Have reviewed team conference with pt who is aware and agreeable with targeted d/c date of 6/14 and mod independent goals.  She confirms that she intends to remain local either at her mother's home or with her niece until she feels she can manage at her home in [facility].  Will continue to follow.  Eryk Beavers, LCSW

## 2017-03-14 NOTE — Progress Notes (Signed)
Physical Therapy Session Note  Patient Details  Name: Tracy Price MRN: [id number] Date of Birth: Aug 26, 1956  Today's Date: 03/14/2017 PT Individual Time: 1003-1100 PT Individual Time Calculation (min): 57 min    Skilled Therapeutic Interventions/Progress Updates:    Session 1:  1003-1100:  No pain complaints reported.  Session initiated with pt seated up in recliner chair.  Focused on improving gait efficiency, cadence, and stability during function activities for hopeful D/C to home environment.  Pt ambulated approx 200 ft with RW to apartment where she worked on identifying and finding objects needed to prepare pancakes.  This functional activity involved significant use of side stepping, reaching, and stooping to locate all objects.  Trialed single point cane, however, this decreased efficiency of gait pattern.  With cueing for long steps and manual cues for arm swing, pt improves cadence with ambulation without device.  When fatigued, pt with increased LOB during ambulation.  Also worked on pt turning to look behind her to pick up horseshoes and then tossing object along with stepping over cones in tandem,  Pt able to SLS on R LE x 9 sec and L LE x 7 sec today.  Following session, pt left up in recliner with all needs met and call bell in reach.  Session 2: 6056-3729:  Session today focused on improving endurance and dual task capabilities during ambulation.  Pt performed nu-step x 10 min on L1 with B UE/LE.  Pt then ambulated approx 300 ft total with and without walker working on head turns and also alternating with therapist and naming animals in ABC order.  Following session, pt returned to room, up in recliner chair with call bell in reach and needs met.  Therapy Documentation Precautions:  Precautions Precautions: Fall Restrictions Weight Bearing Restrictions: No   See Function Navigator for Current Functional Status.   Therapy/Group: Individual Therapy  Leemon Ayala Hilario Quarry 03/14/2017, 12:12 PM

## 2017-03-15 ENCOUNTER — Inpatient Hospital Stay (HOSPITAL_COMMUNITY): Payer: BC Managed Care – PPO | Admitting: Occupational Therapy

## 2017-03-15 LAB — GLUCOSE, CAPILLARY
GLUCOSE-CAPILLARY: 117 mg/dL — AB (ref 65–99)
Glucose-Capillary: 113 mg/dL — ABNORMAL HIGH (ref 65–99)
Glucose-Capillary: 182 mg/dL — ABNORMAL HIGH (ref 65–99)
Glucose-Capillary: 92 mg/dL (ref 65–99)

## 2017-03-15 NOTE — Progress Notes (Signed)
[place] [facility]IC[facility] & RE[facility]ABILITATION     PROGRESS NOTE  Subjective/Complaints:  Pt seen laying in bed this AM.  She slept better overnight and notes improvement with Robaxin.    ROS: Denies CP, SOB, N/V/D.  Objective: Vital Signs: Blood pressure 121/67, pulse 87, temperature 98.9 F (37.2 C), temperature source Oral, resp. rate 16, weight 73.2 kg (161 lb 6.4 oz), SpO2 99 %. No results found. No results for input(s): WBC, HGB, HCT, PLT in the last 72 hours. No results for input(s): NA, K, CL, GLUCOSE, BUN, CREATININE, CALCIUM in the last 72 hours.  Invalid input(s): CO CBG (last 3)   Recent Labs  03/14/17 1626 03/14/17 2115 03/15/17 0609  GLUCAP 173* 139* 117*    Wt Readings from Last 3 Encounters:  03/10/17 73.2 kg (161 lb 6.4 oz)  03/08/17 72.3 kg (159 lb 6.3 oz)  04/30/16 61.7 kg (136 lb)    Physical Exam:  BP 121/67 (BP Location: Left Arm)   Pulse 87   Temp 98.9 F (37.2 C) (Oral)   Resp 16   Wt 73.2 kg (161 lb 6.4 oz)   SpO2 99%   BMI 31.52 kg/m  Constitutional: She appears well-developed and well-nourished. NAD. HENT: Left crani incisions with staples in place and dry crusted blood.   Eyes: EOMI. No discharge.  Cardiovascular: RRR.  No JVD. Respiratory: Effort normal and breath sounds normal.  GI: Soft. Bowel sounds are normal.  Musculoskeletal: She exhibits no edema or tenderness. Neurological: She is alert and oriented.  Speech clear.  Able to follow one and two step commands without difficulty.  Motor: RUE: 4+/5 proximal to distal (unchanged) RLE: 4+/5 proximal to distal (stable) LUE/LLE: 5/5 proximal to distal  Skin: Resolving ecchymosis RUE/RLE  Psychiatric: She has a normal mood and affect. Her behavior is normal. Thought content normal.   Assessment/Plan: 1. 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 Physiatrist is providing close team  supervision and 24 hour management of active medical problems listed below. Physiatrist and rehab team continue to assess barriers to discharge/monitor patient progress toward functional and medical goals.  Function:  Bathing Bathing position   Position: Shower  Bathing parts Body parts bathed by patient: Right arm, Left arm, Chest, Abdomen, Front perineal area, Buttocks, Right upper leg, Left upper leg, Back    Bathing assist Assist Level: Supervision or verbal cues      Upper Body Dressing/Undressing Upper body dressing   What is the patient wearing?: Bra, Pull over shirt/dress Bra - Perfomed by patient: Thread/unthread right bra strap, Thread/unthread left bra strap Bra - Perfomed by helper: Hook/unhook bra (pull down sports bra) Pull over shirt/dress - Perfomed by patient: Thread/unthread left sleeve, Thread/unthread right sleeve, Put head through opening, Pull shirt over trunk          Upper body assist Assist Level: Touching or steadying assistance(Pt > 75%)      Lower Body Dressing/Undressing Lower body dressing   What is the patient wearing?: Underwear, Pants, Socks, Shoes Underwear - Performed by patient: Thread/unthread right underwear leg, Thread/unthread left underwear leg, Pull underwear up/down   Pants- Performed by patient: Thread/unthread right pants leg, Thread/unthread left pants leg, Pull pants up/down, Fasten/unfasten pants       Socks - Performed by patient: Don/doff right sock, Don/doff left sock   Shoes - Performed by patient: Don/doff right shoe, Don/doff left shoe, Fasten right, Fasten left  Lower body assist Assist for lower body dressing: Supervision or verbal cues, Set up   Set up : To obtain clothing/put away  Toileting Toileting   Toileting steps completed by patient: Adjust clothing prior to toileting, Performs perineal hygiene, Adjust clothing after toileting   Toileting Assistive Devices: Grab bar or rail  Toileting assist  Assist level: No help/no cues   Transfers Chair/bed transfer   Chair/bed transfer method: Ambulatory Chair/bed transfer assist level: Touching or steadying assistance (Pt > 75%) Chair/bed transfer assistive device: Armrests     Locomotion Ambulation     Max distance: 150 ft Assist level: Touching or steadying assistance (Pt > 75%)   Wheelchair          Cognition Comprehension Comprehension assist level: Follows complex conversation/direction with extra time/assistive device  Expression Expression assist level: Expresses complex ideas: With extra time/assistive device  Social Interaction Social Interaction assist level: Interacts appropriately with others with medication or extra time (anti-anxiety, antidepressant).  Problem Solving Problem solving assist level: Solves basic problems with no assist  Memory Memory assist level: More than reasonable amount of time    Medical Problem List and Plan: 1.  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   Continue CIR 2.  DVT Prophylaxis/Anticoagulation: Mechanical: Sequential compression devices, below knee Bilateral lower extremities 3. Headaches/Pain Management: Diclofenac, Roxi prn 4. Mood: LCSW to follow for evaluation and support.  5. Neuropsych: This patient is capable of making decisions on her own behalf. 6. Skin/Wound Care: routine pressure relief measures. Maintain adequate nutritional and hydration status.  7. Fluids/Electrolytes/Nutrition: Monitor I/O.  8. HTN: Monitor BP bid. Continue cozaar  Monitor with increased activity  Controlled 6/9 9. ABLA: Monitor serially.   Hb 11.8 on 6/5  Labs ordered for monday  Cont to monitor 10. CKD stage 3: Encourage fluid intake.   Cr 1.16 on 6/5  Labs ordered for monday  Cont to monitor 11. Leucocytosis: Resolved  Monitor for signs of infection.  12. T2DM: Hgb A1c-  6.5. Monitor BS ac/hs. On Trajenta. Use SSI for elevate BS.   Relatively controlled on 6/9 13. OSA: Encourage use of CPAP.  78. H/o depression: On Zoloft.  15. Constipation: Augmented bowel regimen.  16. Seizure prophylaxis: On keppra 17. Acute lower UTI  UA+, Ucx with multiple species, reculture with insig growth  Complete empiric Macrobid started 6/5-6/11 18. Hypoalbuminemia  Supplement initiated 6/5 19. Obesity  Body mass index is 31.52 kg/m.  Diet and exercise education  Encourage weight loss to increase endurance and promote overall health   LOS (Days) 5 A FACE TO FACE EVALUATION WAS PERFORMED  Ivie Savitt Lorie Phenix 03/15/2017 10:11 AM

## 2017-03-15 NOTE — Progress Notes (Signed)
Occupational Therapy Session Note  Patient Details  Name: Tracy Price MRN: [id number] Date of Birth: Oct 04, 1956  Today's Date: 03/15/2017 OT Individual Time: 4628-6381 OT Individual Time Calculation (min): 42 min    Short Term Goals: Week 1:  OT Short Term Goal 1 (Week 1): STG = LTGs due to ELOS    Skilled Therapeutic Interventions/Progress Updates:    Tx focus on endurance, balance, and functional mobility without device during self care tasks.   Pt greeted in recliner, requesting shower. She ambulated with Min A to shower bench. OT assisted with washing hair to keep staple sites dry. Supervision sit<stand for pericare with use of grab bars. Afterwards pt ambulated to dresser to retrieve clothing and proceeded to dress in her recliner. Min guard standing while donning bra/shirt and lifting pants over hips. Pt then requesting to complete laundry. Pt ambulating to suitcase to retrieve necessary clothing items and packing them in laundry bag on bed. Pt carrying bag down hallway with extra time and Min A. 1 small LOB with OT assist for steadying. Pt using R UE to place clothing in machine, adjust control settings, and write her name/room number on laundry bag and white board. Pt then sat in couch in family room for short rest break. Sit<stand from couch without device and Min A. She then ambulated back to room in manner as written above and left with all needs within reach.   Therapy Documentation Precautions:  Precautions Precautions: Fall Restrictions Weight Bearing Restrictions: No   Vital Signs: Therapy Vitals Pulse Rate: 87 BP: 121/67 Patient Position (if appropriate): Lying Pain: No c/o pain during tx  Pain Assessment Pain Assessment: 0-10 Pain Score: 4  Pain Type: Acute pain Pain Location: Head Pain Orientation: Left;Anterior ADL:  :    See Function Navigator for Current Functional Status.   Therapy/Group: Individual Therapy  [name] A Cliffard Hair 03/15/2017, 12:39 PM

## 2017-03-16 ENCOUNTER — Inpatient Hospital Stay (HOSPITAL_COMMUNITY): Payer: BC Managed Care – PPO

## 2017-03-16 ENCOUNTER — Inpatient Hospital Stay (HOSPITAL_COMMUNITY): Payer: BC Managed Care – PPO | Admitting: Occupational Therapy

## 2017-03-16 LAB — GLUCOSE, CAPILLARY
GLUCOSE-CAPILLARY: 114 mg/dL — AB (ref 65–99)
GLUCOSE-CAPILLARY: 123 mg/dL — AB (ref 65–99)
GLUCOSE-CAPILLARY: 107 mg/dL — AB (ref 65–99)
GLUCOSE-CAPILLARY: 130 mg/dL — AB (ref 65–99)

## 2017-03-16 NOTE — Progress Notes (Signed)
Occupational Therapy Session Note  Patient Details  Name: Tracy Price MRN: [id number] Date of Birth: [birth year]  Today's Date: 03/16/2017 OT Individual Time:  - 1415-1545  (90 min)      Short Term Goals: Week 1:  OT Short Term Goal 1 (Week 1): STG = LTGs due to ELOS Week 2:      Skilled Therapeutic Interventions/Progress Updates:    OT focused on functional balance, mobility, RUE coordination and strength, relaxation strategies/  Pt voiced being uptight because of her decreased balance.  Provided edcuation on relaxation strategies ( relax shoulders, breathing with eyes closed or fixed on object and body in standing with back support or sitting upright in good posture, heel toe ambulation for improved balance).  Pt engaged in making grilled cheese sandwich using stove top.  She needed min assist to find pots.  Pt prepared sandwich with min cues to cut down heat when needed problem solving to melt cheese and not burn the bread.  Pt used RUE  For holding ,stirring, and lifting with minimal control issues.   Provided cues for pt to be mindful of hitting head on cabinets.  Pt reported having difficulty with getting spoon or fork to mouth.  Provided suggestion of using mirror and scoop assist with another utensil.    Pt verbalized understanding.      Therapy Documentation Precautions:  Precautions Precautions: Fall Restrictions Weight Bearing Restrictions: No       Pain:  none   ADL:   Vision:  Instructed pt on turning head and eyes as she moves.    Perception        :    See Function Navigator for Current Functional Status.   Therapy/Group: Individual Therapy  Lisa Roca 03/16/2017, 3:55 PM

## 2017-03-16 NOTE — Progress Notes (Addendum)
Occupational Therapy Session Note  Patient Details  Name: Tracy Price MRN: [id number] Date of Birth: 1956/04/02  Today's Date: 03/16/2017 OT Individual Time: 7829-5621 OT Individual Time Calculation (min): 57 min    Short Term Goals: Week 1:  OT Short Term Goal 1 (Week 1): STG = LTGs due to ELOS  Skilled Therapeutic Interventions/Progress Updates:    1:1. Focus of session on standing balance, endurance, RUE use, safety awareness, and visual scanning. Pt ambulates throughout session without AD with supervision and VC for posture, stride length and arm swing. Pt with 1 LOB noted but able to self correct. Pt bathes seated with supervision for sit to stand to wash buttocks and peri area. Pt dresses seated on recliner with min A to pull bra strap down in back. Pt stands at sink with supervision to groom. In therapy gym while standing on foam pad with supervision, pt assembles pipe tree to match picture located on R. Pt requires VC to incorporate RUE into activity. IN ADL kitchen, pt searches for items in cabinets/appliances as various levels with supervision and VC for visual scanning to R. Exited session with pt seated in recliner with call light in reach and all needs met.  Therapy Documentation Precautions:  Precautions Precautions: Fall Restrictions Weight Bearing Restrictions: No   See Function Navigator for Current Functional Status.   Therapy/Group: Individual Therapy  [name] 03/16/2017, 10:00 AM

## 2017-03-16 NOTE — Progress Notes (Signed)
[place] [facility]IC[facility] RE[facility]ABILITATION     PROGRESS NOTE  Subjective/Complaints:  Pt seen laying in bed this AM.  She slept well overnight, but notes headache this AM.   ROS: +Headache. Denies CP, SOB, N/V/D.  Objective: Vital Signs: Blood pressure 135/65, pulse 68, temperature 98 F (36.7 C), temperature source Oral, resp. rate 16, weight 73.2 kg (161 lb 6.4 oz), SpO2 100 %. No results found. No results for input(s): WBC, HGB, HCT, PLT in the last 72 hours. No results for input(s): NA, K, CL, GLUCOSE, BUN, CREATININE, CALCIUM in the last 72 hours.  Invalid input(s): CO CBG (last 3)   Recent Labs  03/15/17 1720 03/15/17 2055 03/16/17 0625  GLUCAP 182* 92 114*    Wt Readings from Last 3 Encounters:  03/10/17 73.2 kg (161 lb 6.4 oz)  03/08/17 72.3 kg (159 lb 6.3 oz)  04/30/16 61.7 kg (136 lb)    Physical Exam:  BP 135/65 (BP Location: Left Arm)   Pulse 68   Temp 98 F (36.7 C) (Oral)   Resp 16   Wt 73.2 kg (161 lb 6.4 oz)   SpO2 100%   BMI 31.52 kg/m  Constitutional: She appears well-developed and well-nourished. NAD. HENT: Left crani incisions with staples in place.   Eyes: EOMI. No discharge.  Cardiovascular: RRR.  No JVD. Respiratory: Effort normal and breath sounds normal.  GI: Soft. Bowel sounds are normal.  Musculoskeletal: She exhibits no edema or tenderness. Neurological: She is alert and oriented.  Speech clear.  Able to follow one and two step commands without difficulty.  Motor: RUE: 4+/5 proximal to distal (stable) RLE: 4+/5 proximal to distal (stable) LUE/LLE: 5/5 proximal to distal  Skin: Resolving ecchymosis RUE/RLE  Psychiatric: She has a normal mood and affect. Her behavior is normal. Thought content normal.   Assessment/Plan: 1. 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 Physiatrist is providing close team supervision and 24 hour  management of active medical problems listed below. Physiatrist and rehab team continue to assess barriers to discharge/monitor patient progress toward functional and medical goals.  Function:  Bathing Bathing position   Position: Shower  Bathing parts Body parts bathed by patient: Right arm, Left arm, Chest, Abdomen, Front perineal area, Buttocks    Bathing assist Assist Level: Supervision or verbal cues      Upper Body Dressing/Undressing Upper body dressing   What is the patient wearing?: Bra, Pull over shirt/dress Bra - Perfomed by patient: Thread/unthread right bra strap, Thread/unthread left bra strap Bra - Perfomed by helper: Hook/unhook bra (pull down sports bra) Pull over shirt/dress - Perfomed by patient: Thread/unthread left sleeve, Thread/unthread right sleeve, Put head through opening, Pull shirt over trunk          Upper body assist Assist Level: Touching or steadying assistance(Pt > 75%)      Lower Body Dressing/Undressing Lower body dressing   What is the patient wearing?: Underwear, Pants, Socks, Shoes Underwear - Performed by patient: Thread/unthread right underwear leg, Thread/unthread left underwear leg, Pull underwear up/down   Pants- Performed by patient: Thread/unthread right pants leg, Thread/unthread left pants leg, Pull pants up/down, Fasten/unfasten pants       Socks - Performed by patient: Don/doff right sock, Don/doff left sock   Shoes - Performed by patient: Don/doff right shoe, Don/doff left shoe, Fasten right, Fasten left            Lower body  assist Assist for lower body dressing: Touching or steadying assistance (Pt > 75%)   Set up : To obtain clothing/put away  Toileting Toileting   Toileting steps completed by patient: Adjust clothing prior to toileting, Performs perineal hygiene, Adjust clothing after toileting   Toileting Assistive Devices: Grab bar or rail  Toileting assist Assist level: No help/no cues   Transfers Chair/bed  transfer   Chair/bed transfer method: Ambulatory Chair/bed transfer assist level: Touching or steadying assistance (Pt > 75%) Chair/bed transfer assistive device: Armrests     Locomotion Ambulation     Max distance: 150 ft Assist level: Touching or steadying assistance (Pt > 75%)   Wheelchair          Cognition Comprehension Comprehension assist level: Follows complex conversation/direction with extra time/assistive device  Expression Expression assist level: Expresses complex ideas: With extra time/assistive device  Social Interaction Social Interaction assist level: Interacts appropriately with others with medication or extra time (anti-anxiety, antidepressant).  Problem Solving Problem solving assist level: Solves basic problems with no assist  Memory Memory assist level: More than reasonable amount of time    Medical Problem List and Plan: 1.  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   Continue CIR 2.  DVT Prophylaxis/Anticoagulation: Mechanical: Sequential compression devices, below knee Bilateral lower extremities 3. Headaches/Pain Management: Diclofenac, Roxi prn 4. Mood: LCSW to follow for evaluation and support.  5. Neuropsych: This patient is capable of making decisions on her own behalf. 6. Skin/Wound Care: routine pressure relief measures. Maintain adequate nutritional and hydration status.  7. Fluids/Electrolytes/Nutrition: Monitor I/O.  8. HTN: Monitor BP bid. Continue cozaar  Monitor with increased activity  Controlled 6/10 9. ABLA: Monitor serially.   Hb 11.8 on 6/5  Labs ordered for tomorrow  Cont to monitor 10. CKD stage 3: Encourage fluid intake.   Cr 1.16 on 6/5  Labs ordered for tomorrow  Cont to monitor 11. Leucocytosis: Resolved  Monitor for signs of infection.  12. T2DM: Hgb A1c- 6.5. Monitor BS ac/hs. On Trajenta. Use SSI for  elevate BS.   Relatively controlled on 6/10 13. OSA: Encourage use of CPAP.  91. H/o depression: On Zoloft.  15. Constipation: Augmented bowel regimen.  16. Seizure prophylaxis: On keppra 17. Acute lower UTI  UA+, Ucx with multiple species, reculture with insig growth  Complete empiric Macrobid started 6/5-6/11 18. Hypoalbuminemia  Supplement initiated 6/5 19. Obesity  Body mass index is 31.52 kg/m.  Diet and exercise education  Encourage weight loss to increase endurance and promote overall health   LOS (Days) 6 A FACE TO FACE EVALUATION WAS PERFORMED  Darling Cieslewicz Lorie Phenix 03/16/2017 9:14 AM

## 2017-03-16 NOTE — Progress Notes (Signed)
Physical Therapy Session Note  Patient Details  Name: Tracy Price MRN: [id number] Date of Birth: 1955/10/09  Today's Date: 03/16/2017 PT Individual Time: 1300-1400 PT Individual Time Calculation (min): 60 min   Short Term Goals: Week 1:  PT Short Term Goal 1 (Week 1): STG=LTG       Therapy Documentation Precautions:  Precautions Precautions: Fall Restrictions Weight Bearing Restrictions: No  Sit to and from stand transfer supervision Ambulatory transfer supervision  Patient ambulated 250 without assistive device supervision. Patient ambulated with a step through gait pattern with varying cadence and decreased reciprocal arm swing. Verbal cues for pacing and reciprocal arm swing and upright posture. Verbal cues for environmental awareness and obstacle negotiation.   Nu-Step 10 minutes level 4  Patient negotiated 12 stepsx2 with one handrail supervision with a step to pattern and min assist with reciprocal pattern. Verbal cues proper sequence and technique.   B heel raises 3x10  Patient ambulated 150 feetx3 over outdoor uneven and unlevel surfaces. Close supervision to incidental min assist for balance. Patient required verbal cues for environmental awareness and obstacle negotiation. Patient with frequent cues for scanning for obstacles.   Curb negotiation min assist secondary to  Poor foot placement. Patient educated on proper sequence and technique.   Dynavision 3 trials one minute each focusing on coordination, dynamic standing balance and visual scanning.   Vitals monitored and remained stable throughout session. Patient returned to room at end of session with all needs met.    See Function Navigator for Current Functional Status.   Therapy/Group: Individual Therapy  Tracy Price 03/16/2017, 1:14 PM

## 2017-03-17 ENCOUNTER — Inpatient Hospital Stay (HOSPITAL_COMMUNITY): Payer: BC Managed Care – PPO | Admitting: Physical Therapy

## 2017-03-17 ENCOUNTER — Inpatient Hospital Stay (HOSPITAL_COMMUNITY): Payer: BC Managed Care – PPO | Admitting: Occupational Therapy

## 2017-03-17 LAB — BASIC METABOLIC PANEL
ANION GAP: 9 (ref 5–15)
BUN: 20 mg/dL (ref 6–20)
CO2: 25 mmol/L (ref 22–32)
Calcium: 9.1 mg/dL (ref 8.9–10.3)
Chloride: 105 mmol/L (ref 101–111)
Creatinine, Ser: 1.57 mg/dL — ABNORMAL HIGH (ref 0.44–1.00)
GFR, EST AFRICAN AMERICAN: 40 mL/min — AB (ref >60)
GFR, EST NON AFRICAN AMERICAN: 35 mL/min — AB (ref >60)
Glucose, Bld: 140 mg/dL — ABNORMAL HIGH (ref 65–99)
POTASSIUM: 4 mmol/L (ref 3.5–5.1)
SODIUM: 139 mmol/L (ref 135–145)

## 2017-03-17 LAB — CBC WITH DIFFERENTIAL/PLATELET
BASOS ABS: 0 10*3/uL (ref 0.0–0.1)
Basophils Relative: 0 %
Eosinophils Absolute: 0.4 10*3/uL (ref 0.0–0.7)
Eosinophils Relative: 5 %
HEMATOCRIT: 38.7 % (ref 36.0–46.0)
Hemoglobin: 12.5 g/dL (ref 12.0–15.0)
LYMPHS PCT: 18 %
Lymphs Abs: 1.5 10*3/uL (ref 0.7–4.0)
MCH: 29.1 pg (ref 26.0–34.0)
MCHC: 32.3 g/dL (ref 30.0–36.0)
MCV: 90.2 fL (ref 78.0–100.0)
Monocytes Absolute: 0.7 10*3/uL (ref 0.1–1.0)
Monocytes Relative: 9 %
NEUTROS ABS: 5.5 10*3/uL (ref 1.7–7.7)
Neutrophils Relative %: 68 %
PLATELETS: 267 10*3/uL (ref 150–400)
RBC: 4.29 MIL/uL (ref 3.87–5.11)
RDW: 13 % (ref 11.5–15.5)
WBC: 8.2 10*3/uL (ref 4.0–10.5)

## 2017-03-17 LAB — GLUCOSE, CAPILLARY
GLUCOSE-CAPILLARY: 123 mg/dL — AB (ref 65–99)
GLUCOSE-CAPILLARY: 130 mg/dL — AB (ref 65–99)
Glucose-Capillary: 116 mg/dL — ABNORMAL HIGH (ref 65–99)
Glucose-Capillary: 94 mg/dL (ref 65–99)

## 2017-03-17 NOTE — Plan of Care (Signed)
Problem: RH SAFETY Goal: RH STG ADHERE TO SAFETY PRECAUTIONS W/ASSISTANCE/DEVICE STG Adhere to Safety Precautions With  Cues/supervision Assistance/Device.   Outcome: Progressing Patient verbalized understanding of calling for assistance when attempting to get out of bed.  Patient correctly demonstrated use of call bell when needing assistance.  Bed alarm in use.  Patient progressing towards goal.

## 2017-03-17 NOTE — Progress Notes (Signed)
Occupational Therapy Session Note  Patient Details  Name: Tracy Price MRN: [id number] Date of Birth: 11/24/1955  Today's Date: 03/17/2017 OT Individual Time: 7622-6333 and 1030-1100 OT Individual Time Calculation (min): 56 min and 30 min   Short Term Goals: Week 1:  OT Short Term Goal 1 (Week 1): STG = LTGs due to ELOS  Skilled Therapeutic Interventions/Progress Updates:    1) Treatment session with focus on functional mobility without AD and dynamic standing balance.  Pt ambulated to room shower without AD with supervision.  Engaged in bathing at Mod I level at sit > stand with assist to wash hair to insure avoiding staples.  Close supervision when navigating incline in/out of bathroom with pt stabilizing self on door frame.  Completed dressing at sit > stand level with supervision.  Grooming tasks completed Mod I when seated and supervision for standing.  Ambulated to therapy gym without AD with supervision with improved posture and gait speed.  Engaged in 2 sets of 10 each shoulder rolls, shoulder horizontal adduction, and trunk rotation with 2# medicine ball with focus on improved mobility and postural control.  2) Treatment session with focus on divided attention during standing balance to increase challenge and awareness during dual task.  Attempted use of Dynavision with alternating lights and grocery list, however pt unable to read list due to small font without her glasses.  Utilized vertical card board with visual scanning to locate matching playing cards, while engaging in conversation to challenge divided attention.  Increased challenge to standing on compliant surface to challenge balance.  Ambulated back to room without AD at overall supervision.  Therapy Documentation Precautions:  Precautions Precautions: Fall Restrictions Weight Bearing Restrictions: No General:   Vital Signs: Therapy Vitals Temp: 98.2 F (36.8 C) Temp Source: Oral Pulse Rate: 66 Resp: 18 BP:  107/68 Patient Position (if appropriate): Lying Oxygen Therapy SpO2: 98 % O2 Device: Not Delivered Pain:   ADL:   Vision   Perception    Praxis   Exercises:   Other Treatments:    See Function Navigator for Current Functional Status.   Therapy/Group: Individual Therapy  Simonne Come 03/17/2017, 7:50 AM

## 2017-03-17 NOTE — Progress Notes (Signed)
Physical Therapy Session Note  Patient Details  Name: Tracy Price MRN: [id number] Date of Birth: 1956-08-01  Today's Date: 03/17/2017 PT Individual Time: 6962-9528 PT Individual Time Calculation (min): 30 min    Skilled Therapeutic Interventions/Progress Updates:    Pt with no c/o pain. Pt re-tested on Berg balance test, improved from 27/56 to 39/56, still at high risk for falls, pt educated and expresses understanding.  Dynamic gait training with head turns and start/stop and speed changes with close supervision, occasional min guard for LOB with head turns.  Pt continues to require cues for awareness of deficits and continues to ask about driving after d/c.   Balance: Berg Balance Test Sit to Stand: Able to stand without using hands and stabilize independently Standing Unsupported: Able to stand 30 seconds unsupported Sitting with Back Unsupported but Feet Supported on Floor or Stool: Able to sit safely and securely 2 minutes Stand to Sit: Controls descent by using hands Transfers: Able to transfer safely, minor use of hands Standing Unsupported with Eyes Closed: Able to stand 10 seconds with supervision Standing Ubsupported with Feet Together: Able to place feet together independently and stand for 1 minute with supervision From Standing, Reach Forward with Outstretched Arm: Can reach forward >12 cm safely (5") From Standing Position, Pick up Object from Floor: Able to pick up shoe, needs supervision From Standing Position, Turn to Look Behind Over each Shoulder: Turn sideways only but maintains balance Turn 360 Degrees: Able to turn 360 degrees safely but slowly Standing Unsupported, Alternately Place Feet on Step/Stool: Able to complete >2 steps/needs minimal assist Standing Unsupported, One Foot in Front: Able to plae foot ahead of the other independently and hold 30 seconds Standing on One Leg: Able to lift leg independently and hold equal to or more than 3 seconds Total Score:  39   See Function Navigator for Current Functional Status.   Therapy/Group: Individual Therapy  Kyoko Elsea 03/17/2017, 1:58 PM

## 2017-03-17 NOTE — Progress Notes (Signed)
Physical Therapy Note  Patient Details  Name: Shahd Occhipinti MRN: [id number] Date of Birth: [birth year] Today's Date: 03/17/2017    Time: 835-947 72 minutes  1:1 No c/o pain.  Pt performed gait in controlled environment throughout unit without AD with distant supervision.  Dynamic gait and balance training with gait with ball bounce, obstacle negotiation, gait up/down incline and on uneven surfaces all with close supervision.  Standing balance ball toss and picking up objects from the ground with supervision.  Standing on airex foam with head turns requires min/mod A to keep balance especially with lateral head turns vs vertical head turns.  Pt with improving insight and awareness into deficits.  Supine stretching due to c/o back feeling "stiff".  Pt performed 2 x 30 seconds: single knee to chest stretch, supine piriformis stretch, lower trunk rotation.  Pt performed 2 x 10 bridging.  Nustep x 8 minutes level 4 for continued UE/LE strengthening. Stair negotiation x 12 stairs with 1 handrail with supervision.  Pt states she understands need to have supervision at home but states she hopes it "isn't for long".   DONAWERTH,KAREN 03/17/2017, 9:48 AM

## 2017-03-17 NOTE — Progress Notes (Signed)
[facility] PHYSICAL MEDICINE & REHABILITATION     PROGRESS NOTE  Subjective/Complaints:  Pt seen sitting up in her chair this AM.  She slept well overnight and is ready for therapies to resume.   ROS: Denies CP, SOB, N/V/D.  Objective: Vital Signs: Blood pressure 107/68, pulse 66, temperature 98.2 F (36.8 C), temperature source Oral, resp. rate 18, weight 73.2 kg (161 lb 6.4 oz), SpO2 98 %. No results found. No results for input(s): WBC, HGB, HCT, PLT in the last 72 hours. No results for input(s): NA, K, CL, GLUCOSE, BUN, CREATININE, CALCIUM in the last 72 hours.  Invalid input(s): CO CBG (last 3)   Recent Labs  03/16/17 1629 03/16/17 2139 03/17/17 0634  GLUCAP 107* 130* 116*    Wt Readings from Last 3 Encounters:  03/10/17 73.2 kg (161 lb 6.4 oz)  03/08/17 72.3 kg (159 lb 6.3 oz)  04/30/16 61.7 kg (136 lb)    Physical Exam:  BP 107/68 (BP Location: Left Arm)   Pulse 66   Temp 98.2 F (36.8 C) (Oral)   Resp 18   Wt 73.2 kg (161 lb 6.4 oz)   SpO2 98%   BMI 31.52 kg/m  Constitutional: She appears well-developed and well-nourished. NAD. HENT: Left crani incisions with staples in place.   Eyes: EOMI. No discharge.  Cardiovascular: RRR.  No JVD. Respiratory: Effort normal and breath sounds normal.  GI: Soft. Bowel sounds are normal.  Musculoskeletal: She exhibits no edema or tenderness. Neurological: She is alert and oriented.  Speech clear.  Able to follow one and two step commands without difficulty.  Motor: RUE: 4+/5 proximal to distal (unchanged) RLE: 4+/5 proximal to distal (stable) LUE/LLE: 5/5 proximal to distal  Skin: Resolving ecchymosis RUE/RLE  Psychiatric: She has a normal mood and affect. Her behavior is normal. Thought content normal.   Assessment/Plan: 1. 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 Physiatrist is providing close team supervision and  24 hour management of active medical problems listed below. Physiatrist and rehab team continue to assess barriers to discharge/monitor patient progress toward functional and medical goals.  Function:  Bathing Bathing position   Position: Shower  Bathing parts Body parts bathed by patient: Right arm, Left arm, Chest, Abdomen, Front perineal area, Buttocks, Right upper leg, Left upper leg, Right lower leg, Left lower leg, Back    Bathing assist Assist Level: Set up   Set up : To obtain items  Upper Body Dressing/Undressing Upper body dressing   What is the patient wearing?: Bra, Pull over shirt/dress Bra - Perfomed by patient: Thread/unthread right bra strap, Thread/unthread left bra strap, Hook/unhook bra (pull down sports bra) Bra - Perfomed by helper: Hook/unhook bra (pull down sports bra) Pull over shirt/dress - Perfomed by patient: Thread/unthread left sleeve, Thread/unthread right sleeve, Put head through opening, Pull shirt over trunk          Upper body assist Assist Level: Supervision or verbal cues      Lower Body Dressing/Undressing Lower body dressing   What is the patient wearing?: Underwear, Pants, Socks, Shoes Underwear - Performed by patient: Thread/unthread right underwear leg, Thread/unthread left underwear leg, Pull underwear up/down   Pants- Performed by patient: Thread/unthread right pants leg, Thread/unthread left pants leg, Pull pants up/down, Fasten/unfasten pants       Socks - Performed by patient: Don/doff right sock, Don/doff left sock   Shoes - Performed by patient: Don/doff  right shoe, Don/doff left shoe            Lower body assist Assist for lower body dressing: Supervision or verbal cues   Set up : To obtain clothing/put away  Toileting Toileting   Toileting steps completed by patient: Adjust clothing prior to toileting, Performs perineal hygiene, Adjust clothing after toileting   Toileting Assistive Devices: Grab bar or rail   Toileting assist Assist level: No help/no cues   Transfers Chair/bed transfer   Chair/bed transfer method: Ambulatory Chair/bed transfer assist level: Touching or steadying assistance (Pt > 75%) Chair/bed transfer assistive device: Armrests     Locomotion Ambulation     Max distance: 150 ft Assist level: Supervision or verbal cues   Wheelchair          Cognition Comprehension Comprehension assist level: Follows complex conversation/direction with extra time/assistive device  Expression Expression assist level: Expresses complex ideas: With extra time/assistive device  Social Interaction Social Interaction assist level: Interacts appropriately with others with medication or extra time (anti-anxiety, antidepressant).  Problem Solving Problem solving assist level: Solves basic problems with no assist  Memory Memory assist level: More than reasonable amount of time    Medical Problem List and Plan: 1.  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   Continue CIR 2.  DVT Prophylaxis/Anticoagulation: Mechanical: Sequential compression devices, below knee Bilateral lower extremities 3. Headaches/Pain Management: Diclofenac, Roxi prn 4. Mood: LCSW to follow for evaluation and support.  5. Neuropsych: This patient is capable of making decisions on her own behalf. 6. Skin/Wound Care: routine pressure relief measures. Maintain adequate nutritional and hydration status.  7. Fluids/Electrolytes/Nutrition: Monitor I/O.  8. HTN: Monitor BP bid. Continue cozaar  Monitor with increased activity  Controlled 6/11 9. ABLA: Monitor serially.   Hb 11.8 on 6/5  Labs pending  Cont to monitor 10. CKD stage 3: Encourage fluid intake.   Cr 1.16 on 6/5  Labs pending  Cont to monitor 11. Leucocytosis: Resolved  Monitor for signs of infection.  12. T2DM: Hgb A1c- 6.5. Monitor BS ac/hs.  On Trajenta. Use SSI for elevate BS.   Relatively controlled on 6/11 13. OSA: Encourage use of CPAP.  64. H/o depression: On Zoloft.  15. Constipation: Augmented bowel regimen.  16. Seizure prophylaxis: On keppra 17. Acute lower UTI  UA+, Ucx with multiple species, reculture with insig growth  Complete empiric Macrobid started 6/5-6/11 18. Hypoalbuminemia  Supplement initiated 6/5 19. Obesity  Body mass index is 31.52 kg/m.  Diet and exercise education  Encourage weight loss to increase endurance and promote overall health  LOS (Days) 7 A FACE TO FACE EVALUATION WAS PERFORMED  Iman Reinertsen Lorie Phenix 03/17/2017 9:26 AM

## 2017-03-18 ENCOUNTER — Inpatient Hospital Stay (HOSPITAL_COMMUNITY): Payer: BC Managed Care – PPO | Admitting: Occupational Therapy

## 2017-03-18 ENCOUNTER — Inpatient Hospital Stay (HOSPITAL_COMMUNITY): Payer: BC Managed Care – PPO | Admitting: Physical Therapy

## 2017-03-18 LAB — GLUCOSE, CAPILLARY
GLUCOSE-CAPILLARY: 95 mg/dL (ref 65–99)
Glucose-Capillary: 115 mg/dL — ABNORMAL HIGH (ref 65–99)
Glucose-Capillary: 97 mg/dL (ref 65–99)
Glucose-Capillary: 105 mg/dL — ABNORMAL HIGH (ref 65–99)

## 2017-03-18 NOTE — Progress Notes (Addendum)
Physical Therapy Note  Patient Details  Name: Cortlyn Cannell MRN: [id number] Date of Birth: 01-Dec-1955 Today's Date: 03/18/2017    Time: 1030-1125 55 minutes  1:1  No c/o pain.  Pt performed gait throughout unit in controlled environments with mod I.  Gait with ball toss walking fwd/bkwd with supervision, gait with ball toss with looking side to side while walking fwd/bkwd with supervision, slower speed and more frequent stops during gait with head turns.  Standing balance on foam with ball toss with min A needed when looking far left and right.  Standing balance ball stop and kick with min guard.  Standing on foam with dynavision with reaction speed slowest in bottom left quadrant, improved with repetition, min A for balance. Pt improving awareness of deficits and need for more PT before complete independence in the community.   Time: 1300-1328 28 minutes  1:1 No c/o pain. Gait throughout unit with mod I in controlled environment. Standing on kinetron for balance and centering ball toss side to side, ball bounce and overhead toss. Pt with occasional LOB Lt > Rt requiring min/mod A to correct. Pt improved balance and wt shifting throughout session.  Rand Boller 03/18/2017, 11:22 AM

## 2017-03-18 NOTE — Progress Notes (Signed)
Occupational Therapy Session Note  Patient Details  Name: Tracy Price MRN: [id number] Date of Birth: [birth year]  Today's Date: 03/18/2017 OT Individual Time: 2774-1287 and 1345-1430 OT Individual Time Calculation (min): 60 min and 45 min   Short Term Goals: Week 1:  OT Short Term Goal 1 (Week 1): STG = LTGs due to ELOS  Skilled Therapeutic Interventions/Progress Updates:    1) Treatment session with focus on ADL retraining and dynamic standing balance.  Pt received seated at sink completing oral care.  Ambulated to therapy gym without AD.  Completed all bathing and dressing at overall Mod I level.  Discussed BERG outcomes and related single leg stance difficulties to functional recommendations to sit for LB dressing.  Ambulated to therapy gym without AD at Mod I level.  Engaged in trunk rotation activity with focus on dynamic standing balance with supervision and increased challenge with standing on foam surface with min guard.  Completed 9 hole peg test with Lt: 35 seconds and Rt: 44 seconds and then 39 seconds. Pt reporting decreased strength/proprioception in RUE.  2) Treatment session with focus on dynamic standing balance and alternating weight shift with toe taps.  Engaged in [place] in standing with focus on trunk rotation and weight shift while also incorporating alternating toe taps to colored spots.  Pt confusing 3 digit sequence, requiring cues approx 50% of time for correct sequence.  Min guard during weight shifting.  Returned to room ambulating while holding tennis racket with ball to increase challenge and divided attention during ambulation.  Therapy Documentation Precautions:  Precautions Precautions: Fall Restrictions Weight Bearing Restrictions: No General:   Vital Signs: Therapy Vitals Temp: 98 F (36.7 C) Temp Source: Oral Pulse Rate: 73 Resp: 16 BP: 121/71 Patient Position (if appropriate): Lying Oxygen Therapy SpO2: 97 % O2 Device: Not  Delivered Pain: Pain Assessment Pain Assessment: No/denies pain Pain Score: 4  Pain Type: Acute pain Pain Location: Head Pain Orientation: Left;Anterior Pain Descriptors / Indicators: Aching;Radiating;Headache Pain Frequency: Intermittent Pain Onset: On-going Patients Stated Pain Goal: 2 Pain Intervention(s): Medication (See eMAR) Multiple Pain Sites: Yes 2nd Pain Site Pain Type: Acute pain Pain Location: Back Pain Frequency: Intermittent Pain Onset: On-going  See Function Navigator for Current Functional Status.   Therapy/Group: Individual Therapy  Simonne Come 03/18/2017, 8:31 AM

## 2017-03-18 NOTE — Progress Notes (Signed)
[facility] PHYSICAL MEDICINE & REHABILITATION     PROGRESS NOTE  Subjective/Complaints:  Pt seen sitting up in her chair this AM about to work with OT.  She states she did not sleep well overnight because the patient across the hall was yelling all night.   ROS: Denies CP, SOB, N/V/D.  Objective: Vital Signs: Blood pressure 121/71, pulse 73, temperature 98 F (36.7 C), temperature source Oral, resp. rate 16, weight 73.2 kg (161 lb 6.4 oz), SpO2 97 %. No results found.  Recent Labs  03/17/17 1006  WBC 8.2  HGB 12.5  HCT 38.7  PLT 267    Recent Labs  03/17/17 1006  NA 139  K 4.0  CL 105  GLUCOSE 140*  BUN 20  CREATININE 1.57*  CALCIUM 9.1   CBG (last 3)   Recent Labs  03/17/17 1624 03/17/17 2124 03/18/17 0622  GLUCAP 123* 130* 115*    Wt Readings from Last 3 Encounters:  03/10/17 73.2 kg (161 lb 6.4 oz)  03/08/17 72.3 kg (159 lb 6.3 oz)  04/30/16 61.7 kg (136 lb)    Physical Exam:  BP 121/71 (BP Location: Left Arm)   Pulse 73   Temp 98 F (36.7 C) (Oral)   Resp 16   Wt 73.2 kg (161 lb 6.4 oz)   SpO2 97%   BMI 31.52 kg/m  Constitutional: She appears well-developed and well-nourished. NAD. HENT: Left crani incisions with steristrips in place.   Eyes: EOMI. No discharge.  Cardiovascular: RRR.  No JVD. Respiratory: Effort normal and breath sounds normal.  GI: Soft. Bowel sounds are normal.  Musculoskeletal: She exhibits no edema or tenderness. Neurological: She is alert and oriented.  Speech clear.  Able to follow one and two step commands without difficulty.  Motor: RUE: 4+/5 proximal to distal (stable) RLE: 4+/5 proximal to distal (stable) LUE/LLE: 5/5 proximal to distal  Skin: Resolving ecchymosis RUE/RLE  Psychiatric: She has a normal mood and affect. Her behavior is normal. Thought content normal.   Assessment/Plan: 1. 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 Physiatrist is providing close team supervision and 24 hour management of active medical problems listed below. Physiatrist and rehab team continue to assess barriers to discharge/monitor patient progress toward functional and medical goals.  Function:  Bathing Bathing position   Position: Shower  Bathing parts Body parts bathed by patient: Right arm, Left arm, Chest, Abdomen, Front perineal area, Buttocks, Right upper leg, Left upper leg, Right lower leg, Left lower leg, Back    Bathing assist Assist Level: More than reasonable time   Set up : To obtain items  Upper Body Dressing/Undressing Upper body dressing   What is the patient wearing?: Bra, Pull over shirt/dress Bra - Perfomed by patient: Thread/unthread right bra strap, Thread/unthread left bra strap, Hook/unhook bra (pull down sports bra) Bra - Perfomed by helper: Hook/unhook bra (pull down sports bra) Pull over shirt/dress - Perfomed by patient: Thread/unthread left sleeve, Thread/unthread right sleeve, Put head through opening, Pull shirt over trunk          Upper body assist Assist Level: More than reasonable time      Lower Body Dressing/Undressing Lower body dressing   What is the patient wearing?: Underwear, Pants, Socks, Shoes Underwear - Performed by patient: Thread/unthread right underwear leg, Thread/unthread left underwear leg, Pull underwear up/down   Pants- Performed by patient: Thread/unthread right pants leg, Thread/unthread left pants leg,  Pull pants up/down, Fasten/unfasten pants       Socks - Performed by patient: Don/doff right sock, Don/doff left sock   Shoes - Performed by patient: Don/doff right shoe, Don/doff left shoe            Lower body assist Assist for lower body dressing: More than reasonable time   Set up : To obtain clothing/put away  Toileting Toileting   Toileting steps completed by patient: Adjust clothing prior to toileting, Performs perineal hygiene, Adjust  clothing after toileting   Toileting Assistive Devices: Grab bar or rail  Toileting assist Assist level: No help/no cues   Transfers Chair/bed transfer   Chair/bed transfer method: Ambulatory Chair/bed transfer assist level: Touching or steadying assistance (Pt > 75%) Chair/bed transfer assistive device: Armrests     Locomotion Ambulation     Max distance: 150 ft Assist level: Supervision or verbal cues   Wheelchair          Cognition Comprehension Comprehension assist level: Follows complex conversation/direction with extra time/assistive device  Expression Expression assist level: Expresses complex ideas: With extra time/assistive device  Social Interaction Social Interaction assist level: Interacts appropriately with others with medication or extra time (anti-anxiety, antidepressant).  Problem Solving Problem solving assist level: Solves basic problems with no assist  Memory Memory assist level: More than reasonable amount of time    Medical Problem List and Plan: 1.  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   Continue CIR 2.  DVT Prophylaxis/Anticoagulation: Mechanical: Sequential compression devices, below knee Bilateral lower extremities 3. Headaches/Pain Management: Diclofenac, Roxi prn 4. Mood: LCSW to follow for evaluation and support.  5. Neuropsych: This patient is capable of making decisions on her own behalf. 6. Skin/Wound Care: routine pressure relief measures. Maintain adequate nutritional and hydration status.  7. Fluids/Electrolytes/Nutrition: Monitor I/O.  8. HTN: Monitor BP bid. Continue cozaar  Monitor with increased activity  Controlled 6/12 9. ABLA: Resolved. Monitor serially.   Hb 12.5 on 6/11  Cont to monitor 10. CKD stage 3: Encourage fluid intake.   Cr 1.57 on 6/11  Cont to monitor 11. Leucocytosis: Resolved  Monitor for signs  of infection.  12. T2DM: Hgb A1c- 6.5. Monitor BS ac/hs. On Trajenta. Use SSI for elevate BS.   Relatively controlled on 6/12 13. OSA: Encourage use of CPAP.  51. H/o depression: On Zoloft.  15. Constipation: Augmented bowel regimen.  16. Seizure prophylaxis: On keppra 17. Acute lower UTI  UA+, Ucx with multiple species, reculture with insig growth  Complete empiric Macrobid completed 6/5-6/11 18. Hypoalbuminemia  Supplement initiated 6/5 19. Obesity  Body mass index is 31.52 kg/m.  Diet and exercise education  Encourage weight loss to increase endurance and promote overall health  LOS (Days) 8 A FACE TO FACE EVALUATION WAS PERFORMED  Ankit Lorie Phenix 03/18/2017 10:57 AM

## 2017-03-19 ENCOUNTER — Inpatient Hospital Stay (HOSPITAL_COMMUNITY): Payer: BC Managed Care – PPO | Admitting: Occupational Therapy

## 2017-03-19 ENCOUNTER — Encounter (HOSPITAL_COMMUNITY): Payer: Self-pay | Admitting: *Deleted

## 2017-03-19 ENCOUNTER — Inpatient Hospital Stay (HOSPITAL_COMMUNITY): Payer: BC Managed Care – PPO

## 2017-03-19 LAB — GLUCOSE, CAPILLARY
GLUCOSE-CAPILLARY: 165 mg/dL — AB (ref 65–99)
GLUCOSE-CAPILLARY: 101 mg/dL — AB (ref 65–99)
Glucose-Capillary: 112 mg/dL — ABNORMAL HIGH (ref 65–99)
Glucose-Capillary: 101 mg/dL — ABNORMAL HIGH (ref 65–99)

## 2017-03-19 NOTE — Progress Notes (Signed)
Recreational Therapy Discharge Summary Patient Details  Name: Tracy Price MRN: [id number] Date of Birth: November 03, 1955 Today's Date: 03/19/2017  Comments on progress toward goals: Pt has made great progress during LOS and is ready for discharge home with intermittent supervision from family.  TR sessions focused on community reintegration, energy conservation, home safety, leisure pursuits with adaptation and meal prep task.  Goal met. Reasons for discharge: discharge from hospital Patient/family agrees with progress made and goals achieved: Yes  Tracy Price 03/19/2017, 3:57 PM

## 2017-03-19 NOTE — Progress Notes (Signed)
Occupational Therapy Session Note  Patient Details  Name: Tracy Price MRN: [id number] Date of Birth: 22-Jul-1956  Today's Date: 03/19/2017 OT Individual Time: 1500-1529 OT Individual Time Calculation (min): 29 min    Short Term Goals: Week 1:  OT Short Term Goal 1 (Week 1): STG = LTGs due to ELOS  Skilled Therapeutic Interventions/Progress Updates:    Pt received supine in bed, ready for OT tx session. Focus of session on IADL task completion. Pt completed hallway functional mobility to rehab apartment where Pt completed standing at sink to wash dishes with distant supervision. Pt then completed additional IADL homemaking task making bed in rehab apartment including placement of sheets, comforter, and pillowcases. Pt requesting to check on laundry prior to return to room, completing functional mobility to Pt laundry area, checking on laundry, taking seated rest break and then returning to Pt room. Educated Pt on self monitoring after return home including recognizing when to take rest breaks and to initiate doing so with Pt verbalizing understanding. Ended session with Pt seated EOB in room, call bell and needs within reach.   Therapy Documentation Precautions:  Precautions Precautions: Fall Restrictions Weight Bearing Restrictions: No   Pain: Pain Assessment Pain Assessment: No/denies pain  See Function Navigator for Current Functional Status.   Therapy/Group: Individual Therapy  Raymondo Band 03/19/2017, 4:13 PM

## 2017-03-19 NOTE — Progress Notes (Signed)
Physical Therapy Discharge Summary  Patient Details  Name: Tracy Price MRN: [id number] Date of Birth: 05-02-1956  Today's Date: 03/19/2017 PT Individual Time: 0830-0930 PT Individual Time Calculation (min): 60 min  Session focused on grad day activities in preparation for discharge tomorrow. Pt able to perform basic transfers, gait, and stair at modified independent level without using AD. Trial with rollator for community use and pt able to return demonstrate safe use of device including locking brakes, up/down ramp, and negotiating curb step with cues. readminstered berg balance test and discussed results with pt below - demonstrating significant improvement since last Berg administered. Follow up and DME discussed with CSW and pt denies further concerns in regards to PT at this time.    Patient has met 7 of 7 long term goals due to improved activity tolerance, improved balance, improved postural control, increased strength, increased range of motion, decreased pain, ability to compensate for deficits, functional use of  right lower extremity and improved awareness.  Patient to discharge at an ambulatory level Modified Independent without AD. Recommending use of rollator for community mobility for energy conservation  Reasons goals not met: n/a - all goals met  Recommendation:  Patient will benefit from ongoing skilled PT services in outpatient setting to continue to advance safe functional mobility, address ongoing impairments in higher level balance and gait, functional endurance, awareness, and minimize fall risk.  Equipment: rollator for community mobility  Reasons for discharge: treatment goals met and discharge from hospital  Patient/family agrees with progress made and goals achieved: Yes  PT Discharge Precautions/Restrictions Precautions Precautions: Fall Restrictions Weight Bearing Restrictions: No   Pain Denies pain. Sensation Sensation Light Touch: Appears  Intact Proprioception: Appears Intact Coordination Gross Motor Movements are Fluid and Coordinated: Yes    Trunk/Postural Assessment  Cervical Assessment Cervical Assessment: Within Functional Limits Thoracic Assessment Thoracic Assessment:  (mild kyphosis) Lumbar Assessment Lumbar Assessment:  (posterior pelvic tilt)  Balance Balance Balance Assessed: Yes Standardized Balance Assessment Standardized Balance Assessment: Berg Balance Test Berg Balance Test Sit to Stand: Able to stand without using hands and stabilize independently Standing Unsupported: Able to stand safely 2 minutes Sitting with Back Unsupported but Feet Supported on Floor or Stool: Able to sit safely and securely 2 minutes Stand to Sit: Sits safely with minimal use of hands Transfers: Able to transfer safely, minor use of hands Standing Unsupported with Eyes Closed: Able to stand 10 seconds safely Standing Ubsupported with Feet Together: Able to place feet together independently and stand 1 minute safely From Standing, Reach Forward with Outstretched Arm: Can reach forward >12 cm safely (5") From Standing Position, Pick up Object from Floor: Able to pick up shoe, needs supervision From Standing Position, Turn to Look Behind Over each Shoulder: Looks behind one side only/other side shows less weight shift Turn 360 Degrees: Able to turn 360 degrees safely but slowly Standing Unsupported, Alternately Place Feet on Step/Stool: Able to stand independently and complete 8 steps >20 seconds Standing Unsupported, One Foot in Front: Able to plae foot ahead of the other independently and hold 30 seconds Standing on One Leg: Able to lift leg independently and hold 5-10 seconds Total Score: 48 Static Sitting Balance Static Sitting - Level of Assistance: 6: Modified independent (Device/Increase time) Dynamic Sitting Balance Dynamic Sitting - Level of Assistance: 6: Modified independent (Device/Increase time) Static Standing  Balance Static Standing - Level of Assistance: 6: Modified independent (Device/Increase time) Dynamic Standing Balance Dynamic Standing - Level of Assistance: 6: Modified independent (Device/Increase  time) Extremity Assessment      RLE Assessment RLE Assessment:  (grossly 4/5) LLE Assessment LLE Assessment: Within Functional Limits   See Function Navigator for Current Functional Status.  Canary Brim Ivory Broad, PT, DPT  03/19/2017, 11:25 AM

## 2017-03-19 NOTE — Progress Notes (Signed)
Occupational Therapy Session Note  Patient Details  Name: Tracy Price MRN: [id number] Date of Birth: April 07, 1956  Today's Date: 03/19/2017 OT Individual Time: 9741-6384 and 1200-1244 OT Individual Time Calculation (min): 60 min and 44 min   Short Term Goals: Week 1:  OT Short Term Goal 1 (Week 1): STG = LTGs due to ELOS  Skilled Therapeutic Interventions/Progress Updates:    1) Completed ADL retraining at overall Mod I level.  Pt completed bathing at sit > stand level in room shower and dressed from tub bench.  Completed grooming tasks in standing and donned socks and shoes seated at EOB.  Ambulated to hospital entrance pushing w/c for energy conservation.  Ambulated over uneven surfaces and up/down community ramp without AD Mod I.  Returned to room ambulating pushing w/c.  Discussed potential for rollator for energy conservation, recommended use of shopping cart when out shopping for energy conservation and safety.  Pt ambulated > 1000 feet Mod I.  2) Completed simple meal prep in ADL kitchen at overall Mod I level.  TR present as co-treat to further assess leisure activity and safety in home.  Pt able to complete all tasks after brief cues to locate items in ADL kitchen due to unfamiliar environment.  Pt cooked rice and veggies with focus on divided attention with two pots and managing time as well as safety during meal prep task.  Pt demonstrating good awareness with positioning of items, safety  and sequencing.    Therapy Documentation Precautions:  Precautions Precautions: Fall Restrictions Weight Bearing Restrictions: No Pain: Pain Assessment Pain Assessment: 0-10 Pain Score: 4  Pain Type: Acute pain Pain Location: Head Pain Orientation: Other (Comment) (headache) Pain Descriptors / Indicators: Aching;Sore Pain Frequency: Intermittent Patients Stated Pain Goal: 2 Pain Intervention(s): Medication (See eMAR)  See Function Navigator for Current Functional  Status.   Therapy/Group: Individual Therapy  Simonne Come 03/19/2017, 8:48 AM

## 2017-03-19 NOTE — Progress Notes (Signed)
Recreational Therapy Session Note  Patient Details  Name: Tracy Price MRN: [id number] Date of Birth: 1956/01/15 Today's Date: 03/19/2017  Pain: no  c/o Skilled Therapeutic Interventions/Progress Updates: session focused on simple meal prep during co-treat with OT.  Pt completed task at Mod I ambulatory level.  Pt demonstrated safety awareness and divided attention throughout task.  [place] 03/19/2017, 3:55 PM

## 2017-03-19 NOTE — Progress Notes (Signed)
[facility] PHYSICAL MEDICINE & REHABILITATION     PROGRESS NOTE  Subjective/Complaints:  Pt seen working with OT this AM.  She did not sleep well overnight again because of the patient across the hall.  She is very much looking forward to being discharge tomorrow however.  ROS: Denies CP, SOB, N/V/D.  Objective: Vital Signs: Blood pressure 115/65, pulse 72, temperature 97.9 F (36.6 C), temperature source Oral, resp. rate 18, weight 73.6 kg (162 lb 3.2 oz), SpO2 99 %. No results found.  Recent Labs  03/17/17 1006  WBC 8.2  HGB 12.5  HCT 38.7  PLT 267    Recent Labs  03/17/17 1006  NA 139  K 4.0  CL 105  GLUCOSE 140*  BUN 20  CREATININE 1.57*  CALCIUM 9.1   CBG (last 3)   Recent Labs  03/18/17 1650 03/18/17 2128 03/19/17 0654  GLUCAP 97 105* 112*    Wt Readings from Last 3 Encounters:  03/19/17 73.6 kg (162 lb 3.2 oz)  03/08/17 72.3 kg (159 lb 6.3 oz)  04/30/16 61.7 kg (136 lb)    Physical Exam:  BP 115/65 (BP Location: Left Arm)   Pulse 72   Temp 97.9 F (36.6 C) (Oral)   Resp 18   Wt 73.6 kg (162 lb 3.2 oz)   SpO2 99%   BMI 31.68 kg/m  Constitutional: She appears well-developed and well-nourished. NAD. HENT: Left crani incisions with steristrips in place.   Eyes: EOMI. No discharge.  Cardiovascular: RRR.  No JVD. Respiratory: Effort normal and breath sounds normal.  GI: Soft. Bowel sounds are normal.  Musculoskeletal: She exhibits no edema or tenderness. Neurological: She is alert and oriented.  Speech clear.  Able to follow one and two step commands without difficulty.  Motor: RUE: 4+/5 proximal to distal (improving) RLE: 4+/5 proximal to distal (stable) LUE/LLE: 5/5 proximal to distal  Skin:Warm and dry.  Psychiatric: She has a normal mood and affect. Her behavior is normal. Thought content normal.   Assessment/Plan: 1. Functional deficits secondary to SDH which require 3+ hours per day of interdisciplinary therapy in a comprehensive  inpatient rehab setting. Physiatrist is providing close team supervision and 24 hour management of active medical problems listed below. Physiatrist and rehab team continue to assess barriers to discharge/monitor patient progress toward functional and medical goals.  Function:  Bathing Bathing position   Position: Shower  Bathing parts Body parts bathed by patient: Right arm, Left arm, Chest, Abdomen, Front perineal area, Buttocks, Right upper leg, Left upper leg, Right lower leg, Left lower leg, Back    Bathing assist Assist Level: More than reasonable time   Set up : To obtain items  Upper Body Dressing/Undressing Upper body dressing   What is the patient wearing?: Bra, Pull over shirt/dress Bra - Perfomed by patient: Thread/unthread right bra strap, Thread/unthread left bra strap, Hook/unhook bra (pull down sports bra) Bra - Perfomed by helper: Hook/unhook bra (pull down sports bra) Pull over shirt/dress - Perfomed by patient: Thread/unthread left sleeve, Thread/unthread right sleeve, Put head through opening, Pull shirt over trunk          Upper body assist Assist Level: More than reasonable time      Lower Body Dressing/Undressing Lower body dressing   What is the patient wearing?: Underwear, Pants, Socks, Shoes Underwear - Performed by patient: Thread/unthread right underwear leg, Thread/unthread left underwear leg, Pull underwear up/down   Pants- Performed by patient: Thread/unthread right pants leg, Thread/unthread left pants leg, Pull  pants up/down, Fasten/unfasten pants       Socks - Performed by patient: Don/doff right sock, Don/doff left sock   Shoes - Performed by patient: Don/doff right shoe, Don/doff left shoe            Lower body assist Assist for lower body dressing: More than reasonable time   Set up : To obtain clothing/put away  Toileting Toileting   Toileting steps completed by patient: Adjust clothing prior to toileting, Performs perineal  hygiene, Adjust clothing after toileting   Toileting Assistive Devices: Grab bar or rail  Toileting assist Assist level: No help/no cues   Transfers Chair/bed transfer   Chair/bed transfer method: Ambulatory Chair/bed transfer assist level: No Help, no cues, assistive device, takes more than a reasonable amount of time Chair/bed transfer assistive device: Armrests     Locomotion Ambulation     Max distance: 150 ft Assist level: No help, No cues, assistive device, takes more than a reasonable amount of time   Wheelchair          Cognition Comprehension Comprehension assist level: Follows complex conversation/direction with extra time/assistive device  Expression Expression assist level: Expresses complex ideas: With extra time/assistive device  Social Interaction Social Interaction assist level: Interacts appropriately with others with medication or extra time (anti-anxiety, antidepressant).  Problem Solving Problem solving assist level: Solves basic problems with no assist  Memory Memory assist level: More than reasonable amount of time    Medical Problem List and Plan: 1.  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   Continue CIR, plan for d/c tomorrow 2.  DVT Prophylaxis/Anticoagulation: Mechanical: Sequential compression devices, below knee Bilateral lower extremities 3. Headaches/Pain Management: Diclofenac, [place] prn 4. Mood: LCSW to follow for evaluation and support.  5. Neuropsych: This patient is capable of making decisions on her own behalf. 6. Skin/Wound Care: routine pressure relief measures. Maintain adequate nutritional and hydration status.  7. Fluids/Electrolytes/Nutrition: Monitor I/O.  8. HTN: Monitor BP bid. Continue cozaar  Monitor with increased activity  Controlled 6/13 9. ABLA: Resolved. Monitor serially.   Hb 12.5 on 6/11  Cont to  monitor 10. CKD stage 3: Encourage fluid intake.   Cr 1.57 on 6/11  Cont to monitor 11. Leucocytosis: Resolved  Monitor for signs of infection.  12. T2DM: Hgb A1c- 6.5. Monitor BS ac/hs. On Trajenta. Use SSI for elevate BS.   Relatively controlled on 6/13 13. OSA: Encourage use of CPAP.  11. H/o depression: On Zoloft.  15. Constipation: Augmented bowel regimen.  16. Seizure prophylaxis: On keppra 17. Acute lower UTI  UA+, Ucx with multiple species, reculture with insig growth  Complete empiric Macrobid completed 6/5-6/11 18. Hypoalbuminemia  Supplement initiated 6/5 19. Obesity  Body mass index is 31.68 kg/m.  Diet and exercise education  Encourage weight loss to increase endurance and promote overall health  LOS (Days) 9 A FACE TO FACE EVALUATION WAS PERFORMED  Aidian Salomon Lorie Phenix 03/19/2017 9:39 AM

## 2017-03-19 NOTE — Progress Notes (Signed)
Occupational Therapy Discharge Summary  Patient Details  Name: Tracy Price MRN: [id number] Date of Birth: [birth year]  Patient has met 65 of 11 long term goals due to improved activity tolerance, improved balance, postural control, ability to compensate for deficits, functional use of  RIGHT upper extremity, improved awareness and improved coordination.  Patient to discharge at overall Modified Independent level.  Patient's care partner is independent to provide the necessary intermittent assistance at discharge.  No formal family education completed as pt is to d/c at Mod I level, however recommendation to stay with family members for a few days during transition home.  Reasons goals not met: NA  Recommendation:  Patient will benefit from ongoing skilled OT services in outpatient setting to continue to advance functional skills in the area of BADL, iADL and Reduce care partner burden.  Equipment: No equipment provided  Reasons for discharge: treatment goals met and discharge from hospital  Patient/family agrees with progress made and goals achieved: Yes  OT Discharge Precautions/Restrictions  Precautions Precautions: Fall General   Vital Signs Therapy Vitals Temp: 98.1 F (36.7 C) Temp Source: Oral Pulse Rate: 88 Resp: 18 BP: 110/83 Oxygen Therapy SpO2: 99 % O2 Device: Not Delivered Pain Pain Assessment Pain Assessment: No/denies pain ADL  See Function Navigator Vision Baseline Vision/History: Wears glasses Wears Glasses: Reading only Patient Visual Report: No change from baseline Vision Assessment?: Yes Eye Alignment: Within Functional Limits Ocular Range of Motion: Within Functional Limits Alignment/Gaze Preference: Within Defined Limits Tracking/Visual Pursuits: Decreased smoothness of horizontal tracking Saccades: Additional eye shifts occurred during testing;Decreased speed of saccadic movement Visual Fields: No apparent deficits Cognition Overall Cognitive  Status: Within Functional Limits for tasks assessed Arousal/Alertness: Awake/alert Orientation Level: Oriented X4 Attention: Selective Selective Attention: Appears intact Memory: Appears intact Awareness: Appears intact Problem Solving: Appears intact Safety/Judgment: Appears intact Sensation Sensation Light Touch: Appears Intact Stereognosis: Not tested Hot/Cold: Appears Intact Proprioception: Appears Intact Coordination Gross Motor Movements are Fluid and Coordinated: Yes Fine Motor Movements are Fluid and Coordinated: No (much improved from eval) 9 Hole Peg Test: Lt: 35 seconds and Rt: 38 seconds  Trunk/Postural Assessment  Cervical Assessment Cervical Assessment: Within Functional Limits Thoracic Assessment Thoracic Assessment:  (mild kyphosis) Lumbar Assessment Lumbar Assessment:  (posterior pelvic tilt)  Balance Balance Balance Assessed: Yes Static Sitting Balance Static Sitting - Level of Assistance: 6: Modified independent (Device/Increase time) Dynamic Sitting Balance Dynamic Sitting - Level of Assistance: 6: Modified independent (Device/Increase time) Static Standing Balance Static Standing - Level of Assistance: 6: Modified independent (Device/Increase time) Dynamic Standing Balance Dynamic Standing - Level of Assistance: 6: Modified independent (Device/Increase time) Extremity/Trunk Assessment RUE Assessment RUE Assessment: Within Functional Limits (limited ROM in shoulder due to h/o shoulder injury, howeved able to use WFL) LUE Assessment LUE Assessment: Within Functional Limits   See Function Navigator for Current Functional Status.  Simonne Come 03/19/2017, 3:37 PM

## 2017-03-20 ENCOUNTER — Encounter (HOSPITAL_COMMUNITY): Payer: Self-pay

## 2017-03-20 LAB — GLUCOSE, CAPILLARY: GLUCOSE-CAPILLARY: 112 mg/dL — AB (ref 65–99)

## 2017-03-20 MED ORDER — LEVETIRACETAM 500 MG PO TABS: 500.0000 mg | ORAL_TABLET | Freq: Two times a day (BID) | ORAL | 0 refills | Status: DC

## 2017-03-20 MED ORDER — METHOCARBAMOL 500 MG PO TABS: 500.0000 mg | ORAL_TABLET | Freq: Four times a day (QID) | ORAL | 0 refills | Status: DC | PRN

## 2017-03-20 MED ORDER — POLYETHYLENE GLYCOL 3350 17 G PO PACK: 17.0000 g | PACK | Freq: Two times a day (BID) | ORAL | 0 refills | Status: DC

## 2017-03-20 MED ORDER — DICLOFENAC SODIUM 1 % TD GEL: 2.0000 g | Freq: Three times a day (TID) | TRANSDERMAL | 1 refills | Status: DC

## 2017-03-20 MED ORDER — DICLOFENAC SODIUM 50 MG PO TBEC: 50.0000 mg | DELAYED_RELEASE_TABLET | Freq: Two times a day (BID) | ORAL | 0 refills | Status: DC | PRN

## 2017-03-20 MED ORDER — LOSARTAN POTASSIUM 100 MG PO TABS: 100.0000 mg | ORAL_TABLET | Freq: Every day | ORAL | 0 refills | Status: DC

## 2017-03-20 MED ORDER — ACETAMINOPHEN 325 MG PO TABS: 325.0000 mg | ORAL_TABLET | ORAL | Status: DC | PRN

## 2017-03-20 MED ORDER — SERTRALINE HCL 100 MG PO TABS: 100.0000 mg | ORAL_TABLET | Freq: Every day | ORAL | 0 refills | Status: DC

## 2017-03-20 MED ORDER — PANTOPRAZOLE SODIUM 40 MG PO TBEC: 40.0000 mg | DELAYED_RELEASE_TABLET | Freq: Every day | ORAL | 0 refills | Status: DC

## 2017-03-20 MED ORDER — ALPRAZOLAM 0.5 MG PO TABS: 0.5000 mg | ORAL_TABLET | Freq: Two times a day (BID) | ORAL | 0 refills | Status: DC | PRN

## 2017-03-20 MED ORDER — LINAGLIPTIN 5 MG PO TABS: 5.0000 mg | ORAL_TABLET | Freq: Every day | ORAL | 0 refills | Status: DC

## 2017-03-20 NOTE — Discharge Summary (Signed)
Physician Discharge Summary  Patient ID: Tracy Price MRN: [id number] DOB/AGE: 60/06/1956 60 y.o.  Admit date: 03/10/2017 Discharge date: 03/20/2017  Discharge Diagnoses:  Principal Problem:   Subdural hemorrhage following injury [facility]) Active Problems:   Acute lower UTI   Hypoalbuminemia due to protein-calorie malnutrition (HCC)   Chronic right shoulder pain   Diabetes mellitus type 2 in obese [facility])   Discharged Condition: stable  Significant Diagnostic Studies: N/A   Labs:  Basic Metabolic Panel:  Recent Labs Lab 03/17/17 1006  NA 139  K 4.0  CL 105  CO2 25  GLUCOSE 140*  BUN 20  CREATININE 1.57*  CALCIUM 9.1    CBC:  Recent Labs Lab 03/17/17 1006  WBC 8.2  NEUTROABS 5.5  HGB 12.5  HCT 38.7  MCV 90.2  PLT 267    CBG:  Recent Labs Lab 03/19/17 0654 03/19/17 1117 03/19/17 1622 03/19/17 2128 03/20/17 0649  GLUCAP 112* 101* 165* 101* 112*    Brief HPI:   Medina Degraffenreid a 61 y.o.right handed femalewith history of CKD, T2DM, HTN, fall with 2 weeks history of intermittent severe headaches, with confusion, slurred speech and unsteady gait.  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 She underwent left-sided burr hole craniectomy for evacuation of SDH by Dr. Saintclair Halsted and was treated with Decadron and on Keppra for seizure prophylaxis. Patient with resultant ataxic gait, expressive deficits, left visual field deficits and right sided weakness affecting mobility as well as ability to carry out ADL tasks. CIR recommended for follow up therapy.     Hospital Course: Donni Oglesby was admitted to rehab 03/10/2017 for inpatient therapies to consist of PT, ST and OT at least three hours five days a week. Past admission physiatrist, therapy team and rehab RN have worked together to provide customized collaborative inpatient rehab. She was maintained on Keppra and has been seizure free. Blood pressure  were monitored on bid basis and have been controlled on Cozaar. Diabetes has been monitored on ac/hs basis with Trajenta on board and BS have been controlled. Po intake has been good and mood has been stable. She was encouraged to continue Zoloft for now and discuss taper with MD on follow up.    Right shoulder pain has been managed with use of voltaren and she was advise to avoid ultram to prevent decrease in seizure threshold. She was also advised to use ice 2-3 times a day for pain control. She was found to have acute UTI and was treated with 7 day course of macrodantin. Acute on chronic renal failure has resolved and  SCr is at baseline. Left crani incision has healed well without signs or symptoms of infection and staples were removed without difficulty.  She has made steady progress during her rehab stay and is modified independent at discharge. She will continue to receive outpatient PT and OT at [facility] after discharge.    Rehab course: During patient's stay in rehab weekly team conferences were held to monitor patient's progress, set goals and discuss barriers to discharge. At admission, patient required min assist with mobility and basic self care tasks. She exhibited mild higher level word finding deficits and difficulty with writing due to problems with spelling. She has had improvement in activity tolerance, balance, postural control, as well as ability to compensate for deficits. She is able to complete ADL tasks at modified independent level and requires distant supervision with IADLs.  She  is modified independent for transfers and is able to ambulate household distance without AD. She requires rollater to ambulate in community setting. Written expression and word finding deficits have resolved and speech therapy signed off by 6/8.    Disposition: 01-Home or Self Care  Diet: Carb  Modified.   Special Instructions: 1. No driving or strenuous activity till cleared by MD.      Allergies as of 03/20/2017      Reactions   Crestor [rosuvastatin Calcium] Other (See Comments)   Mylgia   Lipitor [atorvastatin] Other (See Comments)   Mylagia      Medication List    STOP taking these medications   atorvastatin 20 MG tablet Commonly known as:  LIPITOR   Empagliflozin-Linagliptin 25-5 MG Tabs Commonly known as:  GLYXAMBI   ibuprofen 200 MG tablet Commonly known as:  ADVIL,MOTRIN   traMADol 50 MG tablet Commonly known as:  ULTRAM     TAKE these medications   acetaminophen 325 MG tablet Commonly known as:  TYLENOL Take 1-2 tablets (325-650 mg total) by mouth every 4 (four) hours as needed for mild pain.   ALPRAZolam 0.5 MG tablet--Rx # 45 pills Commonly known as:  XANAX Take 1 tablet (0.5 mg total) by mouth 2 (two) times daily as needed for anxiety.   diclofenac 50 MG EC tablet Commonly known as:  VOLTAREN Take 1 tablet (50 mg total) by mouth 2 (two) times daily as needed for mild pain.   diclofenac sodium 1 % Gel Commonly known as:  VOLTAREN Apply 2 g topically 3 (three) times daily.   levETIRAcetam 500 MG tablet Commonly known as:  KEPPRA Take 1 tablet (500 mg total) by mouth 2 (two) times daily.   linagliptin 5 MG Tabs tablet Commonly known as:  TRADJENTA Take 1 tablet (5 mg total) by mouth daily. Start taking on:  03/21/2017   losartan 100 MG tablet Commonly known as:  COZAAR Take 1 tablet (100 mg total) by mouth daily.   Melatonin 1 MG Tabs Take 1 mg by mouth at bedtime as needed.   methocarbamol 500 MG tablet Commonly known as:  ROBAXIN Take 1 tablet (500 mg total) by mouth every 6 (six) hours as needed for muscle spasms.   pantoprazole 40 MG tablet Commonly known as:  PROTONIX Take 1 tablet (40 mg total) by mouth at bedtime.   polyethylene glycol packet Commonly known as:  MIRALAX / GLYCOLAX Take 17 g by mouth 2 (two) times daily.   sertraline 100 MG tablet Commonly known as:  ZOLOFT Take 1 tablet (100 mg total) by mouth  daily. What changed:  See the new instructions.      Follow-up Information    Susy Frizzle, MD Follow up on 03/27/2017.   Specialty:  Family Medicine Why:  @ 4:00 pm (hospital follow up appt) Contact information: [address] [place] [address] [phone]        Jamse Arn, MD Follow up.   Specialty:  Physical Medicine and Rehabilitation Why:  office will call you with follow up appointment Contact information: [address] [place] [address] [phone]        Kary Kos, MD. Call in 1 day(s).   Specialty:  Neurosurgery Why:  for follow up appointment Contact information: [address] [place] [address] [place] [address] [phone]           Signed: Zuri, [place] 03/23/2017, 11:34 PM

## 2017-03-20 NOTE — Progress Notes (Signed)
[place] PHYSICAL MEDICINE & REHABILITATION     PROGRESS NOTE  Subjective/Complaints:  Pt seen sitting up in her chair this AM eating breakfast.  She slept well overnight and is eager to go home.   ROS: Denies CP, SOB, N/V/D.  Objective: Vital Signs: Blood pressure (!) 98/53, pulse 68, temperature 98.2 F (36.8 C), temperature source Oral, resp. rate 18, weight 73.6 kg (162 lb 3.2 oz), SpO2 97 %. No results found.  Recent Labs  03/17/17 1006  WBC 8.2  HGB 12.5  HCT 38.7  PLT 267    Recent Labs  03/17/17 1006  NA 139  K 4.0  CL 105  GLUCOSE 140*  BUN 20  CREATININE 1.57*  CALCIUM 9.1   CBG (last 3)   Recent Labs  03/19/17 1622 03/19/17 2128 03/20/17 0649  GLUCAP 165* 101* 112*    Wt Readings from Last 3 Encounters:  03/19/17 73.6 kg (162 lb 3.2 oz)  03/08/17 72.3 kg (159 lb 6.3 oz)  04/30/16 61.7 kg (136 lb)    Physical Exam:  BP (!) 98/53 (BP Location: Left Arm)   Pulse 68   Temp 98.2 F (36.8 C) (Oral)   Resp 18   Wt 73.6 kg (162 lb 3.2 oz)   SpO2 97%   BMI 31.68 kg/m  Constitutional: She appears well-developed and well-nourished. NAD. HENT: Left crani incisions with steristrips in place.   Eyes: EOMI. No discharge.  Cardiovascular: RRR.  No JVD. Respiratory: Effort normal and breath sounds normal.  GI: Soft. Bowel sounds are normal.  Musculoskeletal: She exhibits no edema or tenderness. Neurological: She is alert and oriented.  Speech clear.  Able to follow one and two step commands without difficulty.  Motor: RUE: 4+/5 proximal to distal (stable) RLE: 4+/5 proximal to distal (stable) LUE/LLE: 5/5 proximal to distal  Skin:Warm and dry.  Psychiatric: She has a normal mood and affect. Her behavior is normal. Thought content normal.   Assessment/Plan: 1. 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 Physiatrist is providing close team supervision  and 24 hour management of active medical problems listed below. Physiatrist and rehab team continue to assess barriers to discharge/monitor patient progress toward functional and medical goals.  Function:  Bathing Bathing position   Position: Shower  Bathing parts Body parts bathed by patient: Right arm, Left arm, Chest, Abdomen, Front perineal area, Buttocks, Right upper leg, Left upper leg, Right lower leg, Left lower leg, Back    Bathing assist Assist Level: More than reasonable time   Set up : To obtain items  Upper Body Dressing/Undressing Upper body dressing   What is the patient wearing?: Bra, Pull over shirt/dress Bra - Perfomed by patient: Thread/unthread right bra strap, Thread/unthread left bra strap, Hook/unhook bra (pull down sports bra) Bra - Perfomed by helper: Hook/unhook bra (pull down sports bra) Pull over shirt/dress - Perfomed by patient: Thread/unthread left sleeve, Thread/unthread right sleeve, Put head through opening, Pull shirt over trunk          Upper body assist Assist Level: More than reasonable time      Lower Body Dressing/Undressing Lower body dressing   What is the patient wearing?: Underwear, Pants, Socks, Shoes Underwear - Performed by patient: Thread/unthread right underwear leg, Thread/unthread left underwear leg, Pull underwear up/down   Pants- Performed by patient: Thread/unthread right pants leg, Thread/unthread left pants leg, Pull pants up/down, Fasten/unfasten pants  Socks - Performed by patient: Don/doff right sock, Don/doff left sock   Shoes - Performed by patient: Don/doff right shoe, Don/doff left shoe            Lower body assist Assist for lower body dressing: More than reasonable time   Set up : To obtain clothing/put away  Toileting Toileting   Toileting steps completed by patient: Performs perineal hygiene, Adjust clothing prior to toileting, Adjust clothing after toileting Toileting steps completed by helper:  Performs perineal hygiene, Adjust clothing prior to toileting, Adjust clothing after toileting Toileting Assistive Devices: Grab bar or rail  Toileting assist Assist level: No help/no cues   Transfers Chair/bed transfer   Chair/bed transfer method: Ambulatory Chair/bed transfer assist level: No Help, no cues, assistive device, takes more than a reasonable amount of time Chair/bed transfer assistive device: Armrests     Locomotion Ambulation     Max distance: 150 ft Assist level: No help, No cues, assistive device, takes more than a reasonable amount of time   Wheelchair          Cognition Comprehension Comprehension assist level: Follows complex conversation/direction with extra time/assistive device  Expression Expression assist level: Expresses complex ideas: With extra time/assistive device  Social Interaction Social Interaction assist level: Interacts appropriately with others with medication or extra time (anti-anxiety, antidepressant).  Problem Solving Problem solving assist level: Solves basic problems with no assist  Memory Memory assist level: More than reasonable amount of time    Medical Problem List and Plan: 1.  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   D/c today  Will see patient for transitional care management in 1-2 weeks 2.  DVT Prophylaxis/Anticoagulation: Mechanical: Sequential compression devices, below knee Bilateral lower extremities 3. Headaches/Pain Management: Diclofenac, [place] prn 4. Mood: LCSW to follow for evaluation and support.  5. Neuropsych: This patient is capable of making decisions on her own behalf. 6. Skin/Wound Care: routine pressure relief measures. Maintain adequate nutritional and hydration status.  7. Fluids/Electrolytes/Nutrition: Monitor I/O.  8. HTN: Monitor BP bid. Continue cozaar  Monitor with increased  activity  Hypotension this AM, otherwise well controlled 9. ABLA: Resolved. Monitor serially.   Hb 12.5 on 6/11  Cont to monitor 10. CKD stage 3: Encourage fluid intake.   Cr 1.57 on 6/11  Cont to monitor 11. Leucocytosis: Resolved  Monitor for signs of infection.  12. T2DM: Hgb A1c- 6.5. Monitor BS ac/hs. On Trajenta. Use SSI for elevate BS.   Relatively controlled on 6/14 13. OSA: Encourage use of CPAP.  33. H/o depression: On Zoloft.  15. Constipation: Augmented bowel regimen.  16. Seizure prophylaxis: On keppra 17. Acute lower UTI  UA+, Ucx with multiple species, reculture with insig growth  Complete empiric Macrobid completed 6/5-6/11 18. Hypoalbuminemia  Supplement initiated 6/5 19. Obesity  Body mass index is 31.68 kg/m.  Diet and exercise education  Encourage weight loss to increase endurance and promote overall health  LOS (Days) 10 A FACE TO FACE EVALUATION WAS PERFORMED  Caio Devera Lorie Phenix 03/20/2017 8:39 AM

## 2017-03-20 NOTE — Discharge Instructions (Signed)
Inpatient Rehab Discharge Instructions  Tracy Price Discharge date and time: 03/20/17   Activities/Precautions/ Functional Status: Activity: no lifting, driving, or strenuous exercise for till cleared by MD.  Diet: cardiac diet and diabetic diet--check blood sugars 2-3 times a day.  Wound Care: keep wound clean and dry   Functional status:  ___ No restrictions     ___ Walk up steps independently ___ 24/7 supervision/assistance   ___ Walk up steps with assistance _X__ Intermittent supervision/assistance  ___ Bathe/dress independently ___ Walk with walker     ___ Bathe/dress with assistance _X__ Walk Independently    _X__ Shower independently ___ Walk with assistance    ___ Shower with assistance _X__ No alcohol     ___ Return to work/school ________     COMMUNITY REFERRALS UPON DISCHARGE:    Outpatient: PT     OT                 Agency:  [facility] Phone: [phone]               Appointment Date/Time:  6/18 @ 1:15 pm (arrive 12:45 pm)                                                                 6/19 @ 11:45  Medical Equipment/Items Ordered: Rollator walker                                                     Agency/Supplier:  [facility] @ [phone]   GENERAL COMMUNITY RESOURCES FOR PATIENT/FAMILY:  Support Groups: Brain Injury Support Group (handout)      Special Instructions: 1. Need to get at least 8-10 hours of sleep daily to help our brain heal.  2. Pace yourself during the day--take rest breaks or nap in the afternoon to "refuel" 3. Limit time out in sun for 30-60 minutes. 4. Dr. Saintclair Halsted will let you know when you can drive again.  5. Use ice on and off on your shoulder.      Head Injury, Adult There are many types of head injuries. Head injuries can be as minor as a bump, or they can be more severe. More severe head injuries include:  A jarring injury to the brain (concussion).  A bruise of the brain (contusion). This means there is  bleeding in the brain that can cause swelling.  A cracked skull (skull fracture).  Bleeding in the brain that collects, clots, and forms a bump (hematoma).  After a head injury, you may need to be observed for a while in the emergency department or urgent care. Sometimes admission to the hospital is needed. After a head injury has happened, most problems occur within the first 24 hours, but side effects may occur up to 7-10 days after the injury. It is important to watch your condition for any changes. What are the causes? There are many possible causes of a head injury. A serious head injury may happen to someone who is in a car accident (motor vehicle collision). Other causes of major head injuries include bicycle or motorcycle accidents, sports injuries,  and falls. Risk factors This condition is more likely to occur in people who:  Drink a lot of alcohol or use drugs.  Are over the age of 25.  Are at risk for falls.  What are the symptoms? There are many possible symptoms of a head injury. Visible symptoms of a head injury include a bruise, bump, or bleeding at the site of the injury. Other non-visible symptoms include:  Feeling sleepy or not being able to stay awake.  Passing out.  Headache.  Seizures.  Dizziness.  Confusion.  Memory problems.  Nausea or vomiting.  Other possible symptoms that may develop after the head injury include:  Poor attention and concentration.  Fatigue or tiring easily.  Irritability.  Being uncomfortable around bright lights or loud noises.  Anxiety or depression.  Disturbed sleep.  How is this diagnosed? This condition can usually be diagnosed based on your symptoms, a description of the injury, and a physical exam. You may also have imaging tests done, such as a CT scan or MRI. You will also be closely watched. How is this treated? Treatment for this condition depends on the severity and type of injury you have. The main goal of  treatment is to prevent complications and allow the brain time to heal. For mild head injury, you may be sent home and treatment may include:  Observation. A responsible adult should stay with you for 24 hours after your injury and check on you often.  Physical rest.  Brain rest.  Pain medicines.  For severe brain injury, treatment may include:  Close observation. This includes hospitalization with frequent physical exams. You may need to go to a hospital that specializes in head injury.  Pain medicines.  Breathing support. This may include using a ventilator.  Managing the pressure inside the brain (intracranial pressure, or ICP). This may include: ? Monitoring the ICP. ? Giving medicines to decrease the ICP. ? Positioning you to decrease the ICP.  Medicine to prevent seizures.  Surgery to stop bleeding or to remove blood clots (craniotomy).  Surgery to remove part of the skull (decompressive craniectomy). This allows room for the brain to swell.  Follow these instructions at home: Activity  Rest as much as possible and avoid activities that are physically hard or tiring.  Make sure you get enough sleep.  Limit activities that require a lot of thought or attention, such as: ? Watching TV. ? Playing memory games and puzzles. ? Job-related work or homework. ? Working on [employer], and texting.  Avoid activities that could cause another head injury, such as playing sports, until your health care provider approves. Having another head injury, especially before the first one has healed, can be dangerous.  Ask your health care provider when it is safe for you to return to your regular activities, including work or school. Ask your health care provider for a step-by-step plan for gradually returning to activities.  Ask your health care provider when you can drive, ride a bicycle, or use heavy machinery. Your ability to react may be slower after a brain  injury. Never do these activities if you are dizzy.  Lifestyle  Do not drink alcohol until your health care provider approves, and avoid drug use. Alcohol and certain drugs may slow your recovery and can put you at risk of further injury.  If it is harder than usual to remember things, write them down.  If you are easily distracted, try to do one thing at  a time.  Talk with family members or close friends when making important decisions.  Tell your friends, family, a trusted colleague, and work Freight forwarder about your injury, symptoms, and restrictions. Have them watch for any new or worsening problems.  General instructions  Take over-the-counter and prescription medicines only as told by your health care provider.  Have someone stay with you for 24 hours after your head injury. This person should watch you for any changes in your symptoms and be ready to seek medical help, as needed.  Keep all follow-up visits as told by your health care provider. This is important.  Prevention  Work on improving your balance and strength to avoid falls.  Wear a seatbelt when you are in a moving vehicle.  Wear a helmet when riding a bicycle, skiing, or doing any other sport or activity that has a risk of injury.  Drink alcohol only in moderation.  Take safety measures in your home, such as: ? Removing clutter and tripping hazards from floors and stairways. ? Using grab bars in bathrooms and handrails by stairs. ? Placing non-slip mats on floors and in bathtubs. ? Improving lighting in dim areas. Get help right away if:  You have: ? A severe headache that is not helped by medicine. ? Trouble walking, have weakness in your arms and legs, or lose your balance. ? Clear or bloody fluid coming from your nose or ears. ? Changes in your vision. ? A seizure.  You vomit.  Your symptoms get worse.  Your speech is slurred.  You pass out.  You are sleepier and have trouble staying awake.  Your  pupils change size. These symptoms may represent a serious problem that is an emergency. Do not wait to see if the symptoms will go away. Get medical help right away. Call your local emergency services (911 in the [place] Do not drive yourself to the hospital. This information is not intended to replace advice given to you by your health care provider. Make sure you discuss any questions you have with your health care provider. Document Released: 09/23/2005 Document Revised: 04/19/2016 Document Reviewed: 04/02/2016 Elsevier Interactive Patient Education  2017 [employer].    My questions have been answered and I understand these instructions. I will adhere to these goals and the provided educational materials after my discharge from the hospital.  Patient/Caregiver Signature _______________________________ Date __________  Clinician Signature _______________________________________ Date __________  Please bring this form and your medication list with you to all your follow-up doctor's appointments.

## 2017-03-20 NOTE — Progress Notes (Signed)
Social Work  Discharge Note  The overall goal for the admission was met for:   Discharge location: Yes - home with brother who will provide intermittent assistance  Length of Stay: Yes - 10 days  Discharge activity level: Yes - independent  Home/community participation: Yes  Services provided included: MD, RD, PT, OT, SLP, RN, TR, Pharmacy, Neuropsych and SW  Financial Services: Private Insurance: [place]  Follow-up services arranged: Outpatient: PT, OT via [facility], DME: rollator walker via [facility] and Patient/Family has no preference for HH/DME agencies  Comments (or additional information):  Patient/Family verbalized understanding of follow-up arrangements: Yes  Individual responsible for coordination of the follow-up plan: pt  Confirmed correct DME delivered: Tracy Price 03/20/2017    Tracy Price

## 2017-03-20 NOTE — Progress Notes (Signed)
Patient discussed the discharged instructions with PA before she left the hospital.

## 2017-03-20 NOTE — Patient Care Conference (Signed)
Inpatient RehabilitationTeam Conference and Plan of Care Update Date: 03/19/2017   Time: 11:30 AM    Patient Name: Tracy Price      Medical Record Number: [id number]  Date of Birth: Oct 18, 1955 Sex: Female         Room/Bed: 4W09C/4W09C-01 Payor Info: Payor: [place] / Plan: [place] PPO / Product Type: *No Product type* /    Admitting Diagnosis: Confusion  Admit Date/Time:  03/10/2017  5:16 PM Admission Comments: No comment available   Primary Diagnosis:  <principal problem not specified> Principal Problem: <principal problem not specified>  Patient Active Problem List   Diagnosis Date Noted  . Chronic right shoulder pain   . Diabetes mellitus type 2 in obese ([place])   . Neurocognitive deficits   . Acute lower UTI   . Hypoalbuminemia due to protein-calorie malnutrition ([place])   . Subdural hemorrhage following injury ([place]) 03/10/2017  . Vascular headache   . Ataxia   . Benign essential HTN   . Acute blood loss anemia   . Stage 3 chronic kidney disease   . Leukocytosis   . Diabetes mellitus type 2 in nonobese ([facility])   . OSA (obstructive sleep apnea)   . Major depressive disorder with single episode   . Constipation   . Seizure prophylaxis   . SDH (subdural hematoma) ([place]) 03/05/2017  . Cerebral aneurysm 03/05/2017  . Family history of heart disease 02/25/2013  . Chest pain 02/25/2013  . Diabetes mellitus without complication ([place])   . Hyperlipidemia   . Hypertension     Expected Discharge Date: Expected Discharge Date: 03/20/17  Team Members Present: Physician leading conference: Dr. Delice Lesch Social Worker Present: Lennart Pall, LCSW Nurse Present: Other (comment) Genene Churn, RN) PT Present: Jorge Mandril, PT OT Present: Simonne Come, OT SLP Present: Windell Moulding, SLP PPS Coordinator present : Daiva Nakayama, RN, CRRN     Current Status/Progress Goal Weekly Team Focus  Medical   Ataxic gait, expressive deficits, left visual field deficits and  right sided weakness affecting mobility secondary to SDH- on Keppra for seizure prophylaxis.   Improve mobility  See above   Bowel/Bladder   continent of bladder and bowel ,bm 6/14  stay continent all the time  remain continent of bladder and bowel   Swallow/Nutrition/ Hydration             ADL's   supervision- Mod I  Mod I  ADL retraining, balance, safety in kitchen with cooking, d/c planning   Mobility   mod I  mod I gait transfers  d/c plan   Communication             Safety/Cognition/ Behavioral Observations            Pain   no complains of pain  less <2  monitor abd adress it as need it   Skin   head incisionns are OTA  no skin breakdown  monitor skin every shift    Rehab Goals Patient on target to meet rehab goals: Yes *See Care Plan and progress notes for long and short-term goals.  Barriers to Discharge: Mobility, chronic medical condition    Possible Resolutions to Barriers:  Therapies, plan for d/c tomorrow, chronic conditions stabalizing    Discharge Planning/Teaching Needs:  Pt's brother to speak with pt, but he fully intends for her to come to his home where he and his family can provide intermittent assistance.      Team Discussion:  Made mod ind  in room today.  Medically stable and ready for d/c tomorrow.  Revisions to Treatment Plan:  None   Continued Need for Acute Rehabilitation Level of Care: The patient requires daily medical management by a physician with specialized training in physical medicine and rehabilitation for the following conditions: Daily direction of a multidisciplinary physical rehabilitation program to ensure safe treatment while eliciting the highest outcome that is of practical value to the patient.: Yes Daily medical management of patient stability for increased activity during participation in an intensive rehabilitation regime.: Yes Daily analysis of laboratory values and/or radiology reports with any subsequent need for  medication adjustment of medical intervention for : Post surgical problems;Blood pressure problems;Diabetes problems;Renal problems  Angala Hilgers 03/20/2017, 10:25 AM

## 2017-03-21 ENCOUNTER — Telehealth: Payer: Self-pay

## 2017-03-21 NOTE — Telephone Encounter (Signed)
Was sent on accident to dr. Naaman Plummer, please ignore forwarding.

## 2017-03-24 ENCOUNTER — Ambulatory Visit: Payer: BC Managed Care – PPO | Admitting: Occupational Therapy

## 2017-03-24 NOTE — Telephone Encounter (Signed)
Transitional Care call   Patient name: (Hailu, Janeen) DOB: (05-06-1956)  1. Are you/is patient experiencing any problems since coming home? (NO) a. Are there any questions regarding any aspect of care? (NO) 2. Are there any questions regarding medications administration/dosing? (NO) a. Are meds being taken as prescribed? (YES) b. "Patient should review meds with caller to confirm"  3. Have there been any falls? (NO) 4. Has Home Health been to the house and/or have they contacted you? (NO) a. If not, have you tried to contact them? (NO) b. Can we help you contact them? (NO) 5. Are bowels and bladder emptying properly? (NO) a. Are there any unexpected incontinence issues? (NO) b. If applicable, is patient following bowel/bladder programs? (NA) 6. Any fevers, problems with breathing, unexpected pain? (NO) 7. Are there any skin problems or new areas of breakdown? (NO) 8. Has the patient/family member arranged specialty MD follow up (ie cardiology/neurology/renal/surgical/etc.)?  (YES) a. Can we help arrange? (NA) 9. Does the patient need any other services or support that we can help arrange? (NO) 10. Are caregivers following through as expected in assisting the patient? (YES) 11. Has the patient quit smoking, drinking alcohol, or using drugs as recommended? (NA)  Appointment date/time (04-04-2017 / 1140AM), arrive time ([id number]) and who it is with here (DR. PATEL) [place]

## 2017-03-25 ENCOUNTER — Ambulatory Visit: Payer: BC Managed Care – PPO | Admitting: Physical Therapy

## 2017-03-26 ENCOUNTER — Encounter: Payer: Self-pay | Admitting: Family Medicine

## 2017-03-26 ENCOUNTER — Other Ambulatory Visit: Payer: Self-pay | Admitting: Student

## 2017-03-26 DIAGNOSIS — S065X9A Traumatic subdural hemorrhage with loss of consciousness of unspecified duration, initial encounter: Secondary | ICD-10-CM

## 2017-03-27 ENCOUNTER — Encounter: Payer: Self-pay | Admitting: Family Medicine

## 2017-03-27 ENCOUNTER — Ambulatory Visit (INDEPENDENT_AMBULATORY_CARE_PROVIDER_SITE_OTHER): Payer: BC Managed Care – PPO | Admitting: Family Medicine

## 2017-03-27 ENCOUNTER — Ambulatory Visit
Admission: RE | Admit: 2017-03-27 | Discharge: 2017-03-27 | Disposition: A | Discharge status: Home / Self Care | Payer: BC Managed Care – PPO | Source: Ambulatory Visit | Attending: Student | Admitting: Student

## 2017-03-27 VITALS — BP 146/94 | HR 78 | Temp 98.9°F | Resp 16 | Ht 61.0 in | Wt 163.0 lb

## 2017-03-27 DIAGNOSIS — I10 Essential (primary) hypertension: Secondary | ICD-10-CM

## 2017-03-27 DIAGNOSIS — E119 Type 2 diabetes mellitus without complications: Secondary | ICD-10-CM | POA: Diagnosis not present

## 2017-03-27 DIAGNOSIS — Z09 Encounter for follow-up examination after completed treatment for conditions other than malignant neoplasm: Secondary | ICD-10-CM | POA: Diagnosis not present

## 2017-03-27 DIAGNOSIS — E7800 Pure hypercholesterolemia, unspecified: Secondary | ICD-10-CM | POA: Diagnosis not present

## 2017-03-27 DIAGNOSIS — S065X9A Traumatic subdural hemorrhage with loss of consciousness of unspecified duration, initial encounter: Secondary | ICD-10-CM

## 2017-03-27 NOTE — Progress Notes (Signed)
Subjective:    Patient ID: Tracy Price, female    DOB: 27-May-1956, 61 y.o.   MRN: [id number]  HPI Unfortunately, patient was recently admitted to the hospital with an acute on chronic subdural hematoma. I have copied relevant portions of the discharge summary and included them below for my reference:  Admit date: 03/10/2017 Discharge date: 03/20/2017  Discharge Diagnoses:  Principal Problem:   Subdural hemorrhage following injury [facility]) Active Problems:   Acute lower UTI   Hypoalbuminemia due to protein-calorie malnutrition ([place])   Chronic right shoulder pain   Diabetes mellitus type 2 in obese [facility])  Brief HPI:   Tracy Price a 61 y.o.right handed femalewith history of CKD, T2DM, HTN, fall with 2 weeks history of intermittent severe headaches, with confusion, slurred speech and unsteady gait.  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 She underwent left-sided burr hole craniectomy for evacuation of SDH by Dr. Saintclair Halsted and was treated with Decadron and on Keppra for seizure prophylaxis. Patient with resultant ataxic gait, expressive deficits, left visual field deficits and right sided weakness affecting mobility as well as ability to carry out ADL tasks. CIR recommended for follow up therapy.     Hospital Course: Tracy Price was admitted to rehab 03/10/2017 for inpatient therapies to consist of PT, ST and OT at least three hours five days a week. Past admission physiatrist, therapy team and rehab RN have worked together to provide customized collaborative inpatient rehab. She was maintained on Keppra and has been seizure free. Blood pressure were monitored on bid basis and have been controlled on Cozaar. Diabetes has been monitored on ac/hs basis with Trajenta on board and BS have been controlled. Po intake has been good and mood has been stable. She was encouraged to continue Zoloft for now and discuss taper with MD  on follow up.    Right shoulder pain has been managed with use of voltaren and she was advise to avoid ultram to prevent decrease in seizure threshold. She was also advised to use ice 2-3 times a day for pain control. She was found to have acute UTI and was treated with 7 day course of macrodantin. Acute on chronic renal failure has resolved and  SCr is at baseline. Left crani incision has healed well without signs or symptoms of infection and staples were removed without difficulty.  She has made steady progress during her rehab stay and is modified independent at discharge. She will continue to receive outpatient PT and OT at [place] after discharge.    Rehab course: During patient's stay in rehab weekly team conferences were held to monitor patient's progress, set goals and discuss barriers to discharge. At admission, patient required min assist with mobility and basic self care tasks. She exhibited mild higher level word finding deficits and difficulty with writing due to problems with spelling. She has had improvement in activity tolerance, balance, postural control, as well as ability to compensate for deficits. She is able to complete ADL tasks at modified independent level and requires distant supervision with IADLs.  She is modified independent for transfers and is able to ambulate household distance without AD. She requires rollater to ambulate in community setting. Written expression and word finding deficits have resolved and speech therapy signed off by 6/8.    03/27/17 She is here today for follow-up.  When she was admitted to the hospital, her initial creatinine was 1.8.  Her  last creatinine was tested on 611 was found to be 1.57.  Hemoglobin at discharge was 12.5.  No one has assessed her diabetic control with a hemoglobin A1c in  a year.  Her last cholesterol was checked in July 2017 and her LDL cholesterol at that time was 100. Previously it had been as high as 162. In the  hospital, they discontinued Glyxambi and lipitor.  I'm not sure why the Lipitor was stopped. I assume that the other medication was discontinued because of her acute on chronic renal insufficiency.  Since discharge from the hospital, the patient has done remarkably well. She is ambulating today without assistance. She is able to answer all questions appropriately. She has very minimal word finding difficulties. She is here today with her brother. He does report that she has some mild short-term memory loss. She does have a constant daily headache. She treats this with a combination of old tear and and Tylenol. She also has pain in her shoulder. She is requesting to resume the tramadol but I explained again that this could increase her chance of having a seizure and therefore she agreed not to use it. She is not receiving any physical therapy. She states that she doesn't feel that she needs it. She is exercising on her iron on a daily basis. She denies any seizure activity. She does report fatigue and dizziness on a daily basis.   Past Medical History:  Diagnosis Date  . Chest pain   . CKD (chronic kidney disease) stage 3, GFR 30-59 ml/min   . Diabetes mellitus without complication ([place])    type 2  . Hyperlipidemia   . Hypertension   . Sleep apnea 1995   had surgery to correct   Past Surgical History:  Procedure Laterality Date  . CRANIOTOMY Left 03/05/2017   Procedure: LEFT BURR HOLE  HEMATOMA EVACUATION SUBDURAL;  Surgeon: Kary Kos, MD;  Location: [facility];  Service: Neurosurgery;  Laterality: Left;  Marland Kitchen GASTRIC BYPASS  2005   Current Outpatient Prescriptions on File Prior to Visit  Medication Sig Dispense Refill  . acetaminophen (TYLENOL) 325 MG tablet Take 1-2 tablets (325-650 mg total) by mouth every 4 (four) hours as needed for mild pain.    Marland Kitchen ALPRAZolam (XANAX) 0.5 MG tablet Take 1 tablet (0.5 mg total) by mouth 2 (two) times daily as needed for anxiety. 45 tablet 0  . diclofenac  (VOLTAREN) 50 MG EC tablet Take 1 tablet (50 mg total) by mouth 2 (two) times daily as needed for mild pain. 45 tablet 0  . diclofenac sodium (VOLTAREN) 1 % GEL Apply 2 g topically 3 (three) times daily. 100 g 1  . levETIRAcetam (KEPPRA) 500 MG tablet Take 1 tablet (500 mg total) by mouth 2 (two) times daily. 60 tablet 0  . linagliptin (TRADJENTA) 5 MG TABS tablet Take 1 tablet (5 mg total) by mouth daily. 30 tablet 0  . losartan (COZAAR) 100 MG tablet Take 1 tablet (100 mg total) by mouth daily. 30 tablet 0  . Melatonin 1 MG TABS Take 1 mg by mouth at bedtime as needed.     . methocarbamol (ROBAXIN) 500 MG tablet Take 1 tablet (500 mg total) by mouth every 6 (six) hours as needed for muscle spasms. 30 tablet 0  . pantoprazole (PROTONIX) 40 MG tablet Take 1 tablet (40 mg total) by mouth at bedtime. 30 tablet 0  . polyethylene glycol (MIRALAX / GLYCOLAX) packet Take 17 g by mouth 2 (two) times daily.  60 each 0  . sertraline (ZOLOFT) 100 MG tablet Take 1 tablet (100 mg total) by mouth daily. 30 tablet 0   No current facility-administered medications on file prior to visit.    Allergies  Allergen Reactions  . Crestor [Rosuvastatin Calcium] Other (See Comments)    Mylgia  . Lipitor [Atorvastatin] Other (See Comments)    Mylagia   Social History   Social History  . Marital status: Single    Spouse name: N/A  . Number of children: N/A  . Years of education: N/A   Occupational History  . Not on file.   Social History Main Topics  . Smoking status: Never Smoker  . Smokeless tobacco: Never Used  . Alcohol use No  . Drug use: No  . Sexual activity: Not on file   Other Topics Concern  . Not on file   Social History Narrative  . No narrative on file       Review of Systems  All other systems reviewed and are negative.      Objective:   Physical Exam  Constitutional: She is oriented to person, place, and time. She appears well-developed and well-nourished. No distress.    Cardiovascular: Normal rate, regular rhythm and normal heart sounds.   No murmur heard. Pulmonary/Chest: Effort normal and breath sounds normal. No respiratory distress. She has no wheezes. She has no rales.  Abdominal: Soft. Bowel sounds are normal. She exhibits no distension. There is no tenderness. There is no rebound and no guarding.  Musculoskeletal: She exhibits no edema.  Neurological: She is alert and oriented to person, place, and time. No cranial nerve deficit. She exhibits normal muscle tone.  Skin: She is not diaphoretic.  Psychiatric: She has a normal mood and affect. Her behavior is normal. Judgment and thought content normal.  Vitals reviewed.         Assessment & Plan:  Hospital discharge follow-up - Plan: CBC with Differential/Platelet, COMPLETE METABOLIC PANEL WITH GFR, Hemoglobin A1c, LDL Cholesterol, Direct, Levetiracetam level  Controlled type 2 diabetes mellitus without complication, without long-term current use of insulin (HCC) - Plan: COMPLETE METABOLIC PANEL WITH GFR, Hemoglobin A1c  Essential hypertension  Pure hypercholesterolemia - Plan: LDL Cholesterol, Direct  From a standpoint of driving, strenuous activity etc. I have asked the patient to refrain from this for at least the next 6 weeks. I will reassess the patient in 2 weeks to see how she is progressing. I will check a Keppra level to ensure that she is not becoming toxic given the fact she is reporting some dizziness as well as sedation. I doubt that she is.  If hemoglobin A1c is extremely high, the patient may need to begin additional hyperglycemic agents. Therefore I will need to assess her renal function to see what options exist for her. I would want to avoid Actos because of possible swelling. I would want to avoid metformin due to renal insufficiency. I would want to avoid glipizide because of the risk of hypoglycemia. Therefore she may be a candidate for victoza. Her BP is slightly elevated.   Reassess in 2 weeks.  Will avoid more meds at this time to reduce the chance of hypotension, dizziness, and fatigue.

## 2017-03-28 ENCOUNTER — Encounter: Payer: Self-pay | Admitting: Physical Medicine & Rehabilitation

## 2017-03-28 LAB — CBC WITH DIFFERENTIAL/PLATELET
BASOS ABS: 0 {cells}/uL (ref 0–200)
Basophils Relative: 0 %
EOS ABS: 810 {cells}/uL — AB (ref 15–500)
EOS PCT: 9 %
HCT: 38.4 % (ref 35.0–45.0)
Hemoglobin: 12.3 g/dL (ref 12.0–15.0)
LYMPHS PCT: 22 %
Lymphs Abs: 1980 cells/uL (ref 850–3900)
MCH: 29.2 pg (ref 27.0–33.0)
MCHC: 32 g/dL (ref 32.0–36.0)
MCV: 91.2 fL (ref 80.0–100.0)
MONOS PCT: 6 %
MPV: 10.6 fL (ref 7.5–12.5)
Monocytes Absolute: 540 cells/uL (ref 200–950)
Neutro Abs: 5670 cells/uL (ref 1500–7800)
Neutrophils Relative %: 63 %
PLATELETS: 249 10*3/uL (ref 140–400)
RBC: 4.21 MIL/uL (ref 3.80–5.10)
RDW: 14.1 % (ref 11.0–15.0)
WBC: 9 10*3/uL (ref 3.8–10.8)

## 2017-03-28 LAB — LDL CHOLESTEROL, DIRECT: LDL DIRECT: 159 mg/dL — AB (ref <100)

## 2017-03-28 LAB — COMPLETE METABOLIC PANEL WITH GFR
ALT: 14 U/L (ref 6–29)
AST: 15 U/L (ref 10–35)
Albumin: 4.1 g/dL (ref 3.6–5.1)
Alkaline Phosphatase: 134 U/L — ABNORMAL HIGH (ref 33–130)
BILIRUBIN TOTAL: 0.3 mg/dL (ref 0.2–1.2)
BUN: 25 mg/dL (ref 7–25)
CHLORIDE: 105 mmol/L (ref 98–110)
CO2: 22 mmol/L (ref 20–31)
Calcium: 8.9 mg/dL (ref 8.6–10.4)
Creat: 1.5 mg/dL — ABNORMAL HIGH (ref 0.50–0.99)
GFR, EST AFRICAN AMERICAN: 43 mL/min — AB (ref >=60)
GFR, EST NON AFRICAN AMERICAN: 38 mL/min — AB (ref >=60)
Glucose, Bld: 114 mg/dL — ABNORMAL HIGH (ref 70–99)
POTASSIUM: 4.2 mmol/L (ref 3.5–5.3)
SODIUM: 138 mmol/L (ref 135–146)
Total Protein: 7 g/dL (ref 6.1–8.1)

## 2017-03-28 LAB — HEMOGLOBIN A1C
Hgb A1c MFr Bld: 7.2 % — ABNORMAL HIGH (ref <5.7)
MEAN PLASMA GLUCOSE: 160 mg/dL

## 2017-03-31 ENCOUNTER — Other Ambulatory Visit: Payer: Self-pay | Admitting: Family Medicine

## 2017-03-31 ENCOUNTER — Encounter: Payer: Self-pay | Admitting: Family Medicine

## 2017-03-31 LAB — LEVETIRACETAM LEVEL: KEPPRA (LEVETIRACETAM): 29.1 ug/mL

## 2017-03-31 MED ORDER — HYDROCODONE-ACETAMINOPHEN 5-325 MG PO TABS: 1.0000 | ORAL_TABLET | Freq: Four times a day (QID) | ORAL | 0 refills | Status: DC | PRN

## 2017-04-01 ENCOUNTER — Encounter: Payer: Self-pay | Admitting: Family Medicine

## 2017-04-02 ENCOUNTER — Encounter: Payer: Self-pay | Admitting: Physical Medicine & Rehabilitation

## 2017-04-02 ENCOUNTER — Encounter
Payer: BC Managed Care – PPO | Attending: Physical Medicine & Rehabilitation | Admitting: Physical Medicine & Rehabilitation

## 2017-04-02 VITALS — BP 115/79 | HR 74 | Resp 14

## 2017-04-02 DIAGNOSIS — Z833 Family history of diabetes mellitus: Secondary | ICD-10-CM | POA: Insufficient documentation

## 2017-04-02 DIAGNOSIS — Z8249 Family history of ischemic heart disease and other diseases of the circulatory system: Secondary | ICD-10-CM | POA: Insufficient documentation

## 2017-04-02 DIAGNOSIS — I6200 Nontraumatic subdural hemorrhage, unspecified: Secondary | ICD-10-CM | POA: Insufficient documentation

## 2017-04-02 DIAGNOSIS — I671 Cerebral aneurysm, nonruptured: Secondary | ICD-10-CM | POA: Diagnosis not present

## 2017-04-02 DIAGNOSIS — G8929 Other chronic pain: Secondary | ICD-10-CM | POA: Insufficient documentation

## 2017-04-02 DIAGNOSIS — Z9884 Bariatric surgery status: Secondary | ICD-10-CM | POA: Diagnosis not present

## 2017-04-02 DIAGNOSIS — R531 Weakness: Secondary | ICD-10-CM | POA: Diagnosis not present

## 2017-04-02 DIAGNOSIS — R42 Dizziness and giddiness: Secondary | ICD-10-CM | POA: Diagnosis not present

## 2017-04-02 DIAGNOSIS — I129 Hypertensive chronic kidney disease with stage 1 through stage 4 chronic kidney disease, or unspecified chronic kidney disease: Secondary | ICD-10-CM | POA: Diagnosis not present

## 2017-04-02 DIAGNOSIS — G441 Vascular headache, not elsewhere classified: Secondary | ICD-10-CM

## 2017-04-02 DIAGNOSIS — R4189 Other symptoms and signs involving cognitive functions and awareness: Secondary | ICD-10-CM | POA: Diagnosis not present

## 2017-04-02 DIAGNOSIS — R29818 Other symptoms and signs involving the nervous system: Secondary | ICD-10-CM

## 2017-04-02 DIAGNOSIS — E1122 Type 2 diabetes mellitus with diabetic chronic kidney disease: Secondary | ICD-10-CM | POA: Diagnosis not present

## 2017-04-02 DIAGNOSIS — M549 Dorsalgia, unspecified: Secondary | ICD-10-CM | POA: Diagnosis not present

## 2017-04-02 DIAGNOSIS — N183 Chronic kidney disease, stage 3 (moderate): Secondary | ICD-10-CM | POA: Diagnosis not present

## 2017-04-02 DIAGNOSIS — R51 Headache: Secondary | ICD-10-CM | POA: Insufficient documentation

## 2017-04-02 DIAGNOSIS — E1169 Type 2 diabetes mellitus with other specified complication: Secondary | ICD-10-CM

## 2017-04-02 DIAGNOSIS — Z298 Encounter for other specified prophylactic measures: Secondary | ICD-10-CM | POA: Diagnosis not present

## 2017-04-02 DIAGNOSIS — R260 Ataxic gait: Secondary | ICD-10-CM | POA: Insufficient documentation

## 2017-04-02 DIAGNOSIS — S065X9A Traumatic subdural hemorrhage with loss of consciousness of unspecified duration, initial encounter: Secondary | ICD-10-CM

## 2017-04-02 DIAGNOSIS — Z79899 Other long term (current) drug therapy: Secondary | ICD-10-CM | POA: Insufficient documentation

## 2017-04-02 DIAGNOSIS — S065XAA Traumatic subdural hemorrhage with loss of consciousness status unknown, initial encounter: Secondary | ICD-10-CM

## 2017-04-02 DIAGNOSIS — E785 Hyperlipidemia, unspecified: Secondary | ICD-10-CM | POA: Insufficient documentation

## 2017-04-02 DIAGNOSIS — E669 Obesity, unspecified: Secondary | ICD-10-CM

## 2017-04-02 DIAGNOSIS — H5340 Unspecified visual field defects: Secondary | ICD-10-CM | POA: Insufficient documentation

## 2017-04-02 DIAGNOSIS — I10 Essential (primary) hypertension: Secondary | ICD-10-CM

## 2017-04-02 NOTE — Progress Notes (Signed)
Subjective:    Patient ID: Tracy Price, female    DOB: 12-02-1955, 61 y.o.   MRN: [id number]  HPI 61 y.o. right handed female with history of CKD, T2DM, HTN, presents for transitional care management after receiving CIR after SDH.   Admit date: 03/10/2017 Discharge date: 03/20/2017  Presents with brother, who provides much of the history.  At discharge, she was instructed to follow up with PCP, which she has. She saw Neurosurg and started her on Fioricet for headaches.  Her PCP changed her back pain meds and started her on narcotics. She does not have her glucometer.  Overall, she complains of pain and cognitive deficits. Denies falls. Lives with brother/sister-in-law.   DME: None Therapies: Not going to PT, says get better therapy in community Mobility: Rollator in community  Pain Inventory Average Pain 3 Pain Right Now 3 My pain is intermittent  In the last 24 hours, has pain interfered with the following? General activity 0 Relation with others 0 Enjoyment of life 0 What TIME of day is your pain at its worst? morning, night Sleep (in general) Fair  Pain is worse with: unsure Pain improves with: medication Relief from Meds: unsure  Mobility walk with assistance ability to climb steps?  yes do you drive?  no  Function disabled: date disabled .  Neuro/Psych spasms dizziness  Prior Studies hospital f/u  Physicians involved in your care hospital f/u   Family History  Problem Relation Age of Onset  . Heart disease Mother   . Diabetes Sister 88       heart attack  . Heart disease Brother    Social History   Social History  . Marital status: Single    Spouse name: N/A  . Number of children: N/A  . Years of education: N/A   Social History Main Topics  . Smoking status: Never Smoker  . Smokeless tobacco: Never Used  . Alcohol use No  . Drug use: No  . Sexual activity: Not Asked   Other Topics Concern  . None   Social History Narrative  . None    Past Surgical History:  Procedure Laterality Date  . CRANIOTOMY Left 03/05/2017   Procedure: LEFT BURR HOLE  HEMATOMA EVACUATION SUBDURAL;  Surgeon: Kary Kos, MD;  Location: [place] [place];  Service: Neurosurgery;  Laterality: Left;  Marland Kitchen GASTRIC BYPASS  2005   Past Medical History:  Diagnosis Date  . Chest pain   . CKD (chronic kidney disease) stage 3, GFR 30-59 ml/min   . Diabetes mellitus without complication ([place])    type 2  . Hyperlipidemia   . Hypertension   . Sleep apnea 1995   had surgery to correct   BP 115/79   Pulse 74   Resp 14   SpO2 98%   Opioid Risk Score:   Fall Risk Score:  `1  Depression screen PHQ 2/9  Depression screen PHQ 2/9 04/02/2017  Decreased Interest 0  Down, Depressed, Hopeless 0  PHQ - 2 Score 0  Altered sleeping 0  Tired, decreased energy 0  Change in appetite 0  Feeling bad or failure about yourself  0  Trouble concentrating 0  Moving slowly or fidgety/restless 0  Suicidal thoughts 0  PHQ-9 Score 0  Difficult doing work/chores Not difficult at all   Review of Systems  Constitutional: Negative.   HENT: Negative.   Eyes: Negative.   Respiratory: Negative.   Cardiovascular: Negative.   Gastrointestinal: Negative.   Endocrine: Negative.   Genitourinary:  Negative.   Musculoskeletal: Positive for back pain.       Spasms   Skin: Negative.   Allergic/Immunologic: Negative.   Neurological: Positive for dizziness and headaches.  Hematological: Negative.   Psychiatric/Behavioral: Positive for confusion and sleep disturbance.      Objective:   Physical Exam Constitutional: She appears well-developed and well-nourished. NAD. HENT: Left crani incisions c/d/i Eyes: EOMI. No discharge.  Cardiovascular: RRR.  No JVD. Respiratory: Effort normal and breath sounds normal.  GI: Soft. Bowel sounds are normal.  Musculoskeletal: She exhibits no edema or tenderness. Neurological: She is alert and oriented.  Speech clear.  Able to follow one and two  step commands without difficulty.  Motor: RUE: 4+/5 proximal to distal  RLE: 4+/5 proximal to distal  LUE/LLE: 5/5 proximal to distal  Skin:Warm and dry.  Psychiatric: She has a normal mood and affect. Her behavior is normal. Thought content normal.     Assessment & Plan:  61 y.o. right handed female with history of CKD, T2DM, HTN, presents for transitional care management after receiving CIR after SDH.   1. Ataxic gait, expressive deficits, left visual field deficits and right sided weakness affecting mobility secondary to SDH  Cont Keppra for seizure prophylaxis, decreased by Neurosurg  Cont follow up with Neurosurg  Encouraged rest breaks due to mental fatigue - no TV  2. Headaches  Vascular in nature, being managed by Neurosurg  Fioricit per Neurosrug  3. HTN  Controlled   Cont meds  4. T2DM  Cont meds per PCP  5. Chronic back pain  Narcotics started by PCP  Meds reviewed Referrals reviewed All questions answered

## 2017-04-03 ENCOUNTER — Encounter: Payer: Self-pay | Admitting: Family Medicine

## 2017-04-04 ENCOUNTER — Encounter: Payer: Self-pay | Admitting: Family Medicine

## 2017-04-09 ENCOUNTER — Encounter: Payer: Self-pay | Admitting: Family Medicine

## 2017-04-10 MED ORDER — FREESTYLE LIBRE SENSOR SYSTEM MISC: 3 refills | Status: DC

## 2017-04-10 MED ORDER — FREESTYLE LIBRE READER DEVI: 1.0000 | Freq: Two times a day (BID) | 0 refills | Status: DC

## 2017-04-14 ENCOUNTER — Ambulatory Visit: Payer: BC Managed Care – PPO | Admitting: Physical Therapy

## 2017-04-15 ENCOUNTER — Other Ambulatory Visit: Payer: Self-pay | Admitting: Family Medicine

## 2017-04-15 ENCOUNTER — Ambulatory Visit (INDEPENDENT_AMBULATORY_CARE_PROVIDER_SITE_OTHER): Payer: BC Managed Care – PPO | Admitting: Family Medicine

## 2017-04-15 ENCOUNTER — Encounter: Payer: Self-pay | Admitting: Family Medicine

## 2017-04-15 ENCOUNTER — Ambulatory Visit: Payer: BC Managed Care – PPO | Admitting: Family Medicine

## 2017-04-15 VITALS — BP 148/100 | HR 74 | Temp 98.1°F | Resp 16 | Ht 61.0 in | Wt 160.0 lb

## 2017-04-15 DIAGNOSIS — E7800 Pure hypercholesterolemia, unspecified: Secondary | ICD-10-CM

## 2017-04-15 DIAGNOSIS — I10 Essential (primary) hypertension: Secondary | ICD-10-CM | POA: Diagnosis not present

## 2017-04-15 DIAGNOSIS — M25511 Pain in right shoulder: Secondary | ICD-10-CM | POA: Diagnosis not present

## 2017-04-15 DIAGNOSIS — E119 Type 2 diabetes mellitus without complications: Secondary | ICD-10-CM

## 2017-04-15 MED ORDER — ATORVASTATIN CALCIUM 20 MG PO TABS: 20.0000 mg | ORAL_TABLET | Freq: Every day | ORAL | 5 refills | Status: DC

## 2017-04-15 MED ORDER — TRAMADOL HCL 50 MG PO TABS: 50.0000 mg | ORAL_TABLET | Freq: Two times a day (BID) | ORAL | 1 refills | Status: DC | PRN

## 2017-04-15 NOTE — Progress Notes (Signed)
Subjective:    Patient ID: Tracy Price, female    DOB: September 19, 1956, 61 y.o.   MRN: [id number]  HPI  03/27/17 Unfortunately, patient was recently admitted to the hospital with an acute on chronic subdural hematoma. I have copied relevant portions of the discharge summary and included them below for my reference:  Admit date: 03/10/2017 Discharge date: 03/20/2017  Discharge Diagnoses:  Principal Problem:   Subdural hemorrhage following injury [facility]) Active Problems:   Acute lower UTI   Hypoalbuminemia due to protein-calorie malnutrition ([place])   Chronic right shoulder pain   Diabetes mellitus type 2 in obese [facility])  Brief HPI:   Tracy Price a 61 y.o.right handed femalewith history of CKD, T2DM, HTN, fall with 2 weeks history of intermittent severe headaches, with confusion, slurred speech and unsteady gait.  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 She underwent left-sided burr hole craniectomy for evacuation of SDH by Dr. Saintclair Halsted and was treated with Decadron and on Keppra for seizure prophylaxis. Patient with resultant ataxic gait, expressive deficits, left visual field deficits and right sided weakness affecting mobility as well as ability to carry out ADL tasks. CIR recommended for follow up therapy.     Hospital Course: Tracy Price was admitted to rehab 03/10/2017 for inpatient therapies to consist of PT, ST and OT at least three hours five days a week. Past admission physiatrist, therapy team and rehab RN have worked together to provide customized collaborative inpatient rehab. She was maintained on Keppra and has been seizure free. Blood pressure were monitored on bid basis and have been controlled on Cozaar. Diabetes has been monitored on ac/hs basis with Trajenta on board and BS have been controlled. Po intake has been good and mood has been stable. She was encouraged to continue Zoloft for now and discuss  taper with MD on follow up.    Right shoulder pain has been managed with use of voltaren and she was advise to avoid ultram to prevent decrease in seizure threshold. She was also advised to use ice 2-3 times a day for pain control. She was found to have acute UTI and was treated with 7 day course of macrodantin. Acute on chronic renal failure has resolved and  SCr is at baseline. Left crani incision has healed well without signs or symptoms of infection and staples were removed without difficulty.  She has made steady progress during her rehab stay and is modified independent at discharge. She will continue to receive outpatient PT and OT at [place] after discharge.    Rehab course: During patient's stay in rehab weekly team conferences were held to monitor patient's progress, set goals and discuss barriers to discharge. At admission, patient required min assist with mobility and basic self care tasks. She exhibited mild higher level word finding deficits and difficulty with writing due to problems with spelling. She has had improvement in activity tolerance, balance, postural control, as well as ability to compensate for deficits. She is able to complete ADL tasks at modified independent level and requires distant supervision with IADLs.  She is modified independent for transfers and is able to ambulate household distance without AD. She requires rollater to ambulate in community setting. Written expression and word finding deficits have resolved and speech therapy signed off by 6/8.    03/27/17 She is here today for follow-up.  When she was admitted to the hospital, her initial creatinine was 1.8.  Her last creatinine was tested on 611 was found to be 1.57.  Hemoglobin at discharge was 12.5.  No one has assessed her diabetic control with a hemoglobin A1c in  a year.  Her last cholesterol was checked in July 2017 and her LDL cholesterol at that time was 100. Previously it had been as high as  162. In the hospital, they discontinued Glyxambi and lipitor.  I'm not sure why the Lipitor was stopped. I assume that the other medication was discontinued because of her acute on chronic renal insufficiency.  Since discharge from the hospital, the patient has done remarkably well. She is ambulating today without assistance. She is able to answer all questions appropriately. She has very minimal word finding difficulties. She is here today with her brother. He does report that she has some mild short-term memory loss. She does have a constant daily headache. She treats this with a combination of old tear and and Tylenol. She also has pain in her shoulder. She is requesting to resume the tramadol but I explained again that this could increase her chance of having a seizure and therefore she agreed not to use it. She is not receiving any physical therapy. She states that she doesn't feel that she needs it. She is exercising on her iron on a daily basis. She denies any seizure activity. She does report fatigue and dizziness on a daily basis.  At that time, my plan was: From a standpoint of driving, strenuous activity etc. I have asked the patient to refrain from this for at least the next 6 weeks. I will reassess the patient in 2 weeks to see how she is progressing. I will check a Keppra level to ensure that she is not becoming toxic given the fact she is reporting some dizziness as well as sedation. I doubt that she is.  If hemoglobin A1c is extremely high, the patient may need to begin additional hyperglycemic agents. Therefore I will need to assess her renal function to see what options exist for her. I would want to avoid Actos because of possible swelling. I would want to avoid metformin due to renal insufficiency. I would want to avoid glipizide because of the risk of hypoglycemia. Therefore she may be a candidate for victoza. Her BP is slightly elevated.  Reassess in 2 weeks.  Will avoid more meds at this  time to reduce the chance of hypotension, dizziness, and fatigue.  04/1017 Overall, the patient's doing better. Her headaches have improved. She now ranks them is a 3 on a scale of 1-10. Her neurosurgeon started her on Fiorinal which she takes at night for headache. She's also been taking Vicodin intermittently for headache. She is requesting something for pain in her back and pain in her right shoulder. Previously she was taking tramadol twice a day for chronic low back pain. She is also requesting an injection in her shoulder. The pain in her shoulder is worse with abduction greater than 90. She has positive empty can sign. She has a positive Hawkins sign. I explained to the patient that I'm concerned about mixing tramadol with Fiorinal with hydrocodone. I explained to the patient is too much risk of sedation. Her hemoglobin A1c at the last visit was 7.2 orally started her on Tradjenta.  LDL cholesterol was elevated and we started the patient on Lipitor 20 mg a day.  Blood pressure today is slightly better on losartan at 148/100.  On her last lab work, her creatinine was 1.5.  Recommended discontinuation of diclofenac for this reason Past Medical History:  Diagnosis Date  . Chest pain   . CKD (chronic kidney disease) stage 3, GFR 30-59 ml/min   . Diabetes mellitus without complication ([place])    type 2  . Hyperlipidemia   . Hypertension   . Sleep apnea 1995   had surgery to correct   Past Surgical History:  Procedure Laterality Date  . CRANIOTOMY Left 03/05/2017   Procedure: LEFT BURR HOLE  HEMATOMA EVACUATION SUBDURAL;  Surgeon: Kary Kos, MD;  Location: [facility];  Service: Neurosurgery;  Laterality: Left;  Marland Kitchen GASTRIC BYPASS  2005   Current Outpatient Prescriptions on File Prior to Visit  Medication Sig Dispense Refill  . acetaminophen (TYLENOL) 325 MG tablet Take 1-2 tablets (325-650 mg total) by mouth every 4 (four) hours as needed for mild pain.    Marland Kitchen ALPRAZolam (XANAX) 0.5 MG tablet Take 1  tablet (0.5 mg total) by mouth 2 (two) times daily as needed for anxiety. 45 tablet 0  . levETIRAcetam (KEPPRA) 500 MG tablet Take 1 tablet (500 mg total) by mouth 2 (two) times daily. 60 tablet 0  . linagliptin (TRADJENTA) 5 MG TABS tablet Take 1 tablet (5 mg total) by mouth daily. 30 tablet 0  . losartan (COZAAR) 100 MG tablet Take 1 tablet (100 mg total) by mouth daily. 30 tablet 0  . methocarbamol (ROBAXIN) 500 MG tablet Take 1 tablet (500 mg total) by mouth every 6 (six) hours as needed for muscle spasms. 30 tablet 0  . butalbital-acetaminophen-caffeine (FIORICET, ESGIC) 50-325-40 MG tablet Take by mouth 2 (two) times daily as needed for headache.     No current facility-administered medications on file prior to visit.    Allergies  Allergen Reactions  . Crestor [Rosuvastatin Calcium] Other (See Comments)    Mylgia  . Lipitor [Atorvastatin] Other (See Comments)    Mylagia   Social History   Social History  . Marital status: Single    Spouse name: N/A  . Number of children: N/A  . Years of education: N/A   Occupational History  . Not on file.   Social History Main Topics  . Smoking status: Never Smoker  . Smokeless tobacco: Never Used  . Alcohol use No  . Drug use: No  . Sexual activity: Not on file   Other Topics Concern  . Not on file   Social History Narrative  . No narrative on file       Review of Systems  All other systems reviewed and are negative.      Objective:   Physical Exam  Constitutional: She is oriented to person, place, and time. She appears well-developed and well-nourished. No distress.  Cardiovascular: Normal rate, regular rhythm and normal heart sounds.   No murmur heard. Pulmonary/Chest: Effort normal and breath sounds normal. No respiratory distress. She has no wheezes. She has no rales.  Abdominal: Soft. Bowel sounds are normal. She exhibits no distension. There is no tenderness. There is no rebound and no guarding.  Musculoskeletal:  She exhibits no edema.  Neurological: She is alert and oriented to person, place, and time. No cranial nerve deficit. She exhibits normal muscle tone.  Skin: She is not diaphoretic.  Psychiatric: She has a normal mood and affect. Her behavior is normal. Judgment and thought content normal.  Vitals reviewed.         Assessment & Plan:    Acute pain of right shoulder  Essential hypertension  Controlled type 2  diabetes mellitus without complication, without long-term current use of insulin (HCC)  Pure hypercholesterolemia  Using sterile technique, I injected the right shoulder with 2 mL of lidocaine, 2 mL of Marcaine, and 2 mL of 40 mg per mL Kenalog forte I suspect is chronic tendinitis in the right rotator cuff as well as subacromial bursitis. I will give the patient tramadol 50 mg by mouth twice a day. I will give her 60 tablets. However she must stop Fiorinal as well as hydrocodone. Hopefully this will treat her back pain and her shoulder pain and her headaches. If the headaches persist, consider a trial of Topamax. I will see the patient back in 3 months to recheck her hemoglobin A1c on Tradjenta.  Also want to recheck her fasting lipid panel in 3 months on Lipitor. Her blood pressure remains elevated today however she reports severe sluggishness and fatigue. Therefore I will not make any additional changes to her antihypertensives. When I see her back in 3 months if persistently elevated, I will add amlodipine.

## 2017-04-16 MED ORDER — LINAGLIPTIN 5 MG PO TABS: 5.0000 mg | ORAL_TABLET | Freq: Every day | ORAL | 1 refills | Status: DC

## 2017-04-16 MED ORDER — ATORVASTATIN CALCIUM 20 MG PO TABS: 20.0000 mg | ORAL_TABLET | Freq: Every day | ORAL | 1 refills | Status: DC

## 2017-04-16 MED ORDER — LOSARTAN POTASSIUM 100 MG PO TABS: 100.0000 mg | ORAL_TABLET | Freq: Every day | ORAL | 3 refills | Status: DC

## 2017-04-16 MED ORDER — LEVETIRACETAM 500 MG PO TABS: 500.0000 mg | ORAL_TABLET | Freq: Two times a day (BID) | ORAL | 3 refills | Status: DC

## 2017-04-17 ENCOUNTER — Encounter: Payer: Self-pay | Admitting: Family Medicine

## 2017-04-18 ENCOUNTER — Encounter: Payer: Self-pay | Admitting: Family Medicine

## 2017-04-18 MED ORDER — GLIPIZIDE ER 5 MG PO TB24: 5.0000 mg | ORAL_TABLET | Freq: Every day | ORAL | 3 refills | Status: DC

## 2017-04-18 NOTE — Addendum Note (Signed)
Addended by: Shary Decamp B on: 04/18/2017 09:25 AM   Modules accepted: Orders

## 2017-04-21 ENCOUNTER — Encounter: Payer: Self-pay | Admitting: Physical Medicine & Rehabilitation

## 2017-05-03 ENCOUNTER — Other Ambulatory Visit: Payer: Self-pay | Admitting: Family Medicine

## 2017-05-06 ENCOUNTER — Encounter: Payer: Self-pay | Admitting: Family Medicine

## 2017-05-08 ENCOUNTER — Encounter: Payer: Self-pay | Admitting: Family Medicine

## 2017-05-08 MED ORDER — AMLODIPINE BESYLATE 5 MG PO TABS: 5.0000 mg | ORAL_TABLET | Freq: Every day | ORAL | 3 refills | Status: DC

## 2017-05-09 MED ORDER — TRAMADOL HCL 50 MG PO TABS: 50.0000 mg | ORAL_TABLET | Freq: Three times a day (TID) | ORAL | 1 refills | Status: DC

## 2017-05-25 ENCOUNTER — Encounter: Payer: Self-pay | Admitting: Family Medicine

## 2017-05-26 MED ORDER — ZOSTER VAC RECOMB ADJUVANTED 50 MCG/0.5ML IM SUSR: 0.5000 mL | Freq: Once | INTRAMUSCULAR | 0 refills | Status: AC

## 2017-05-27 ENCOUNTER — Encounter: Payer: Self-pay | Admitting: Family Medicine

## 2017-06-03 ENCOUNTER — Telehealth: Payer: Self-pay | Admitting: Family Medicine

## 2017-06-03 MED ORDER — ALPRAZOLAM 0.5 MG PO TABS: 0.5000 mg | ORAL_TABLET | Freq: Two times a day (BID) | ORAL | 0 refills | Status: DC | PRN

## 2017-06-03 NOTE — Telephone Encounter (Signed)
ok 

## 2017-06-03 NOTE — Telephone Encounter (Signed)
Medication called/sent to requested pharmacy  

## 2017-06-03 NOTE — Telephone Encounter (Signed)
Fax from pharm requesting refill on Xanax - Ok to refill??      LRF 03/20/17

## 2017-06-05 ENCOUNTER — Ambulatory Visit: Payer: BC Managed Care – PPO | Admitting: Physical Medicine & Rehabilitation

## 2017-06-18 ENCOUNTER — Encounter: Payer: Self-pay | Admitting: Family Medicine

## 2017-06-19 ENCOUNTER — Ambulatory Visit (INDEPENDENT_AMBULATORY_CARE_PROVIDER_SITE_OTHER): Payer: BC Managed Care – PPO | Admitting: Family Medicine

## 2017-06-19 ENCOUNTER — Encounter: Payer: Self-pay | Admitting: Family Medicine

## 2017-06-19 VITALS — BP 128/78 | HR 72 | Temp 97.9°F | Resp 14 | Ht 61.0 in | Wt 162.0 lb

## 2017-06-19 DIAGNOSIS — M795 Residual foreign body in soft tissue: Secondary | ICD-10-CM

## 2017-06-19 DIAGNOSIS — Z23 Encounter for immunization: Secondary | ICD-10-CM

## 2017-06-19 DIAGNOSIS — L03115 Cellulitis of right lower limb: Secondary | ICD-10-CM

## 2017-06-19 MED ORDER — CEPHALEXIN 500 MG PO CAPS: 500.0000 mg | ORAL_CAPSULE | Freq: Three times a day (TID) | ORAL | 0 refills | Status: DC

## 2017-06-19 NOTE — Progress Notes (Signed)
Subjective:    Patient ID: Tracy Price, female    DOB: May 23, 1956, 61 y.o.   MRN: [id number]  HPI  2 days ago, the patient suffered a laceration to the superior, lateral right shin from a piece of broken glass. The piece broke out of a mirror and fell like a knife into her leg leaving a 1 cm long incision.  That area continues to drain blood and serous fluid.  The skin surrounding it is warm and painful and there is sharp pain to gentle palpation around the laceration site that radiates down the shin.  The lac was anesthetized with 0.1% lidocaine with epi and probed with forecps.  Probing revealed a hard object and "gritty sound" consistent with retained foreign body.  The object was grasped with hemostats and a 1/2 cm by 2 cm shard of glass was removed from the puncture wound.    Past Medical History:  Diagnosis Date  . Chest pain   . CKD (chronic kidney disease) stage 3, GFR 30-59 ml/min   . Diabetes mellitus without complication ([place])    type 2  . Hyperlipidemia   . Hypertension   . Sleep apnea 1995   had surgery to correct   Past Surgical History:  Procedure Laterality Date  . CRANIOTOMY Left 03/05/2017   Procedure: LEFT BURR HOLE  HEMATOMA EVACUATION SUBDURAL;  Surgeon: Kary Kos, MD;  Location: [facility];  Service: Neurosurgery;  Laterality: Left;  Marland Kitchen GASTRIC BYPASS  2005   Current Outpatient Prescriptions on File Prior to Visit  Medication Sig Dispense Refill  . acetaminophen (TYLENOL) 325 MG tablet Take 1-2 tablets (325-650 mg total) by mouth every 4 (four) hours as needed for mild pain.    Marland Kitchen ALPRAZolam (XANAX) 0.5 MG tablet Take 1 tablet (0.5 mg total) by mouth 2 (two) times daily as needed for anxiety. 60 tablet 0  . amLODipine (NORVASC) 5 MG tablet Take 1 tablet (5 mg total) by mouth daily. 90 tablet 3  . atorvastatin (LIPITOR) 20 MG tablet Take 1 tablet (20 mg total) by mouth daily. 90 tablet 1  . glipiZIDE (GLIPIZIDE XL) 5 MG 24 hr tablet Take 1 tablet (5 mg total) by mouth  daily with breakfast. 90 tablet 3  . linagliptin (TRADJENTA) 5 MG TABS tablet Take 1 tablet (5 mg total) by mouth daily. 90 tablet 1  . losartan (COZAAR) 100 MG tablet Take 1 tablet (100 mg total) by mouth daily. 90 tablet 3  . sertraline (ZOLOFT) 100 MG tablet TAKE 1 TABLET BY MOUTH EVERY DAY 90 tablet 3  . traMADol (ULTRAM) 50 MG tablet Take 1 tablet (50 mg total) by mouth 3 (three) times daily. 90 tablet 1   No current facility-administered medications on file prior to visit.    Allergies  Allergen Reactions  . Crestor [Rosuvastatin Calcium] Other (See Comments)    Mylgia  . Lipitor [Atorvastatin] Other (See Comments)    Mylagia   Social History   Social History  . Marital status: Single    Spouse name: N/A  . Number of children: N/A  . Years of education: N/A   Occupational History  . Not on file.   Social History Main Topics  . Smoking status: Never Smoker  . Smokeless tobacco: Never Used  . Alcohol use No  . Drug use: No  . Sexual activity: Not on file   Other Topics Concern  . Not on file   Social History Narrative  . No narrative on file  Review of Systems  All other systems reviewed and are negative.      Objective:   Physical Exam  Constitutional: She appears well-developed and well-nourished.  Cardiovascular: Normal rate, regular rhythm and normal heart sounds.   Pulmonary/Chest: Effort normal and breath sounds normal.  Musculoskeletal:       Legs: Vitals reviewed.  Puncture wound with surrounding redness.        Assessment & Plan:  Cellulitis of right leg - Plan: cephALEXin (KEFLEX) 500 MG capsule  Foreign body (FB) in soft tissue  Foreign body removed as described in HPI.  Wound was dressed with silvadene, non adherent gauze and coban.  Tdap updated.  Keflex 500 mg tid for 7 days for cellulitis.

## 2017-06-19 NOTE — Addendum Note (Signed)
Addended by: Sheral Flow on: 06/19/2017 01:29 PM   Modules accepted: Orders

## 2017-07-04 ENCOUNTER — Encounter: Payer: Self-pay | Admitting: Family Medicine

## 2017-07-07 ENCOUNTER — Other Ambulatory Visit: Payer: Self-pay | Admitting: Family Medicine

## 2017-07-07 ENCOUNTER — Other Ambulatory Visit: Payer: Self-pay

## 2017-07-07 MED ORDER — TRAMADOL HCL 50 MG PO TABS: 50.0000 mg | ORAL_TABLET | Freq: Three times a day (TID) | ORAL | 1 refills | Status: DC

## 2017-07-07 NOTE — Telephone Encounter (Signed)
ok 

## 2017-07-07 NOTE — Telephone Encounter (Signed)
Last OV 9/13 Last refill 8/3 Patient requesting #[id number] she is taking 4 a day  Ok to refill?

## 2017-07-07 NOTE — Telephone Encounter (Signed)
Rx called in to pharmacy. 

## 2017-07-09 ENCOUNTER — Encounter: Payer: Self-pay | Admitting: Family Medicine

## 2017-07-17 ENCOUNTER — Ambulatory Visit: Payer: BC Managed Care – PPO | Admitting: Family Medicine

## 2017-07-24 ENCOUNTER — Ambulatory Visit: Payer: Self-pay | Admitting: Family Medicine

## 2017-07-25 ENCOUNTER — Encounter: Payer: Self-pay | Admitting: Family Medicine

## 2017-07-25 ENCOUNTER — Ambulatory Visit (INDEPENDENT_AMBULATORY_CARE_PROVIDER_SITE_OTHER): Payer: BC Managed Care – PPO | Admitting: Family Medicine

## 2017-07-25 VITALS — BP 144/88 | HR 98 | Temp 98.0°F | Resp 16 | Ht 61.0 in | Wt 167.0 lb

## 2017-07-25 DIAGNOSIS — E119 Type 2 diabetes mellitus without complications: Secondary | ICD-10-CM | POA: Diagnosis not present

## 2017-07-25 NOTE — Progress Notes (Signed)
Subjective:    Patient ID: Tracy Price, female    DOB: 27-May-1956, 61 y.o.   MRN: [id number]  HPI Unfortunately, patient was recently admitted to the hospital with an acute on chronic subdural hematoma. I have copied relevant portions of the discharge summary and included them below for my reference:  Admit date: 03/10/2017 Discharge date: 03/20/2017  Discharge Diagnoses:  Principal Problem:   Subdural hemorrhage following injury [facility]) Active Problems:   Acute lower UTI   Hypoalbuminemia due to protein-calorie malnutrition ([place])   Chronic right shoulder pain   Diabetes mellitus type 2 in obese [facility])  Brief HPI:   Tracy Price a 61 y.o.right handed femalewith history of CKD, T2DM, HTN, fall with 2 weeks history of intermittent severe headaches, with confusion, slurred speech and unsteady gait.  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 She underwent left-sided burr hole craniectomy for evacuation of SDH by Dr. Saintclair Halsted and was treated with Decadron and on Keppra for seizure prophylaxis. Patient with resultant ataxic gait, expressive deficits, left visual field deficits and right sided weakness affecting mobility as well as ability to carry out ADL tasks. CIR recommended for follow up therapy.     Hospital Course: Tracy Price was admitted to rehab 03/10/2017 for inpatient therapies to consist of PT, ST and OT at least three hours five days a week. Past admission physiatrist, therapy team and rehab RN have worked together to provide customized collaborative inpatient rehab. She was maintained on Keppra and has been seizure free. Blood pressure were monitored on bid basis and have been controlled on Cozaar. Diabetes has been monitored on ac/hs basis with Trajenta on board and BS have been controlled. Po intake has been good and mood has been stable. She was encouraged to continue Zoloft for now and discuss taper with MD  on follow up.    Right shoulder pain has been managed with use of voltaren and she was advise to avoid ultram to prevent decrease in seizure threshold. She was also advised to use ice 2-3 times a day for pain control. She was found to have acute UTI and was treated with 7 day course of macrodantin. Acute on chronic renal failure has resolved and  SCr is at baseline. Left crani incision has healed well without signs or symptoms of infection and staples were removed without difficulty.  She has made steady progress during her rehab stay and is modified independent at discharge. She will continue to receive outpatient PT and OT at [facility] after discharge.    Rehab course: During patient's stay in rehab weekly team conferences were held to monitor patient's progress, set goals and discuss barriers to discharge. At admission, patient required min assist with mobility and basic self care tasks. She exhibited mild higher level word finding deficits and difficulty with writing due to problems with spelling. She has had improvement in activity tolerance, balance, postural control, as well as ability to compensate for deficits. She is able to complete ADL tasks at modified independent level and requires distant supervision with IADLs.  She is modified independent for transfers and is able to ambulate household distance without AD. She requires rollater to ambulate in community setting. Written expression and word finding deficits have resolved and speech therapy signed off by 6/8.    03/27/17 She is here today for follow-up.  When she was admitted to the hospital, her initial creatinine was 1.8.  Her  last creatinine was tested on 611 was found to be 1.57.  Hemoglobin at discharge was 12.5.  No one has assessed her diabetic control with a hemoglobin A1c in  a year.  Her last cholesterol was checked in July 2017 and her LDL cholesterol at that time was 100. Previously it had been as high as 162. In the  hospital, they discontinued Glyxambi and lipitor.  I'm not sure why the Lipitor was stopped. I assume that the other medication was discontinued because of her acute on chronic renal insufficiency.  Since discharge from the hospital, the patient has done remarkably well. She is ambulating today without assistance. She is able to answer all questions appropriately. She has very minimal word finding difficulties. She is here today with her brother. He does report that she has some mild short-term memory loss. She does have a constant daily headache. She treats this with a combination of old tear and and Tylenol. She also has pain in her shoulder. She is requesting to resume the tramadol but I explained again that this could increase her chance of having a seizure and therefore she agreed not to use it. She is not receiving any physical therapy. She states that she doesn't feel that she needs it. She is exercising on her iron on a daily basis. She denies any seizure activity. She does report fatigue and dizziness on a daily basis.  At that time, my plan was: From a standpoint of driving, strenuous activity etc. I have asked the patient to refrain from this for at least the next 6 weeks. I will reassess the patient in 2 weeks to see how she is progressing. I will check a Keppra level to ensure that she is not becoming toxic given the fact she is reporting some dizziness as well as sedation. I doubt that she is.  If hemoglobin A1c is extremely high, the patient may need to begin additional hyperglycemic agents. Therefore I will need to assess her renal function to see what options exist for her. I would want to avoid Actos because of possible swelling. I would want to avoid metformin due to renal insufficiency. I would want to avoid glipizide because of the risk of hypoglycemia. Therefore she may be a candidate for victoza. Her BP is slightly elevated.  Reassess in 2 weeks.  Will avoid more meds at this time to reduce  the chance of hypotension, dizziness, and fatigue.    07/25/17 At that time, the patient's hemoglobin A1c was 7.2. She was continued on the combination of glipizide and Tradjenta.  She is overdue for hemoglobin A1c along with fasting lab work. Her direct LDL at that time was greater than 150 and therefore I recommended Lipitor 20 mg a day. However she complains of myalgias on Lipitor. She states that she has never been able to tolerate a statin. All the statins cause her muscles to hurtl to make her feel weak.  She has stopped the statin in the past and the symptoms improved. . She is compliant with the Lipitor but she is interested in making a change. She is also experiencing episodes of hypoglycemia when she takes glipizide. Occasionally she will take it without a meal and will cause her sugar to drop. She is not checking hers but she will feel extremely weak and dizzy. She will feel shaky.  Symptoms improved when she eats some food. This occurs a few days a week. She denies any polyuria, polydipsia, or blurry vision.  She denies  any chest pain dyspnea on exertion or shortness of breath. She also tells me that the injection I gave her her shoulder did not help. She is also questioning if I can give her a shot in her lower back/posterior hipto help with pain radiating from her back down her leg. I explained to the patient that she really needs to see her orthopedist, Dr. Dossie Der, given the fact that my initial cortisone injection did not help and I am unable to perform epidural steroid injections or hip joint injections   Past Medical History:  Diagnosis Date  . Chest pain   . CKD (chronic kidney disease) stage 3, GFR 30-59 ml/min (HCC)   . Diabetes mellitus without complication ([place])    type 2  . Hyperlipidemia   . Hypertension   . Sleep apnea 1995   had surgery to correct   Past Surgical History:  Procedure Laterality Date  . CRANIOTOMY Left 03/05/2017   Procedure: LEFT BURR HOLE  HEMATOMA  EVACUATION SUBDURAL;  Surgeon: Kary Kos, MD;  Location: [place];  Service: Neurosurgery;  Laterality: Left;  Marland Kitchen GASTRIC BYPASS  2005   Current Outpatient Prescriptions on File Prior to Visit  Medication Sig Dispense Refill  . acetaminophen (TYLENOL) 325 MG tablet Take 1-2 tablets (325-650 mg total) by mouth every 4 (four) hours as needed for mild pain.    Marland Kitchen ALPRAZolam (XANAX) 0.5 MG tablet Take 1 tablet (0.5 mg total) by mouth 2 (two) times daily as needed for anxiety. 60 tablet 0  . amLODipine (NORVASC) 5 MG tablet Take 1 tablet (5 mg total) by mouth daily. 90 tablet 3  . atorvastatin (LIPITOR) 20 MG tablet Take 1 tablet (20 mg total) by mouth daily. 90 tablet 1  . glipiZIDE (GLIPIZIDE XL) 5 MG 24 hr tablet Take 1 tablet (5 mg total) by mouth daily with breakfast. 90 tablet 3  . linagliptin (TRADJENTA) 5 MG TABS tablet Take 1 tablet (5 mg total) by mouth daily. 90 tablet 1  . losartan (COZAAR) 100 MG tablet Take 1 tablet (100 mg total) by mouth daily. 90 tablet 3  . sertraline (ZOLOFT) 100 MG tablet TAKE 1 TABLET BY MOUTH EVERY DAY 90 tablet 3  . traMADol (ULTRAM) 50 MG tablet Take 1 tablet (50 mg total) by mouth 3 (three) times daily. 180 tablet 1   No current facility-administered medications on file prior to visit.    Allergies  Allergen Reactions  . Crestor [Rosuvastatin Calcium] Other (See Comments)    Mylgia  . Lipitor [Atorvastatin] Other (See Comments)    Mylagia   Social History   Social History  . Marital status: Single    Spouse name: N/A  . Number of children: N/A  . Years of education: N/A   Occupational History  . Not on file.   Social History Main Topics  . Smoking status: Never Smoker  . Smokeless tobacco: Never Used  . Alcohol use No  . Drug use: No  . Sexual activity: Not on file   Other Topics Concern  . Not on file   Social History Narrative  . No narrative on file       Review of Systems  All other systems reviewed and are negative.        Objective:   Physical Exam  Constitutional: She is oriented to person, place, and time. She appears well-developed and well-nourished. No distress.  Cardiovascular: Normal rate, regular rhythm and normal heart sounds.   No murmur heard. Pulmonary/Chest: Effort  normal and breath sounds normal. No respiratory distress. She has no wheezes. She has no rales.  Abdominal: Soft. Bowel sounds are normal. She exhibits no distension. There is no tenderness. There is no rebound and no guarding.  Musculoskeletal: She exhibits no edema.  Neurological: She is alert and oriented to person, place, and time. No cranial nerve deficit. She exhibits normal muscle tone.  Skin: She is not diaphoretic.  Psychiatric: She has a normal mood and affect. Her behavior is normal. Judgment and thought content normal.  Vitals reviewed.         Assessment & Plan:  Type 2 diabetes mellitus without complication, without long-term current use of insulin (HCC) - Plan: Hemoglobin A1c, COMPLETE METABOLIC PANEL WITH GFR, Lipid panel, Microalbumin, urine  Check hemoglobin A1c. Goal hemoglobin A1c is less than 6.5. Consider switching glipizide to actos.  Patient is unable to afford name brand medication. However I believe she will be able to tolerate the ACTOS WITHOUT HYPOGLYCEMIa.  I will also check a fasting lipid panel. Goal LDL cholesterol is less than 100. She is unable to tolerate a statin. I recommended decreasing Lipitor 20 mg once a week.  If she is able to tolerate this over several weeks, I would increase to twice a week. I will continue this until we are able to achieve the highest tolerated therapeutic dose. I recommended that she call her orthopedist to discuss a repeat shoulder injection versus an MRI as well as to discuss any back injection

## 2017-07-26 LAB — COMPLETE METABOLIC PANEL WITH GFR
AG RATIO: 1.6 (calc) (ref 1.0–2.5)
ALT: 13 U/L (ref 6–29)
AST: 14 U/L (ref 10–35)
Albumin: 4.2 g/dL (ref 3.6–5.1)
Alkaline phosphatase (APISO): 142 U/L — ABNORMAL HIGH (ref 33–130)
BILIRUBIN TOTAL: 0.5 mg/dL (ref 0.2–1.2)
BUN/Creatinine Ratio: 17 (calc) (ref 6–22)
BUN: 25 mg/dL (ref 7–25)
CALCIUM: 9.1 mg/dL (ref 8.6–10.4)
CHLORIDE: 104 mmol/L (ref 98–110)
CO2: 23 mmol/L (ref 20–32)
Creat: 1.49 mg/dL — ABNORMAL HIGH (ref 0.50–0.99)
GFR, EST NON AFRICAN AMERICAN: 38 mL/min/{1.73_m2} — AB (ref >=60)
GFR, Est African American: 44 mL/min/{1.73_m2} — ABNORMAL LOW (ref >=60)
Globulin: 2.7 g/dL (calc) (ref 1.9–3.7)
Glucose, Bld: 106 mg/dL — ABNORMAL HIGH (ref 65–99)
POTASSIUM: 4 mmol/L (ref 3.5–5.3)
Sodium: 138 mmol/L (ref 135–146)
Total Protein: 6.9 g/dL (ref 6.1–8.1)

## 2017-07-26 LAB — LIPID PANEL
CHOL/HDL RATIO: 2.9 (calc) (ref <5.0)
CHOLESTEROL: 170 mg/dL (ref <200)
HDL: 58 mg/dL (ref >50)
LDL Cholesterol (Calc): 90 mg/dL (calc)
NON-HDL CHOLESTEROL (CALC): 112 mg/dL (ref <130)
Triglycerides: 127 mg/dL (ref <150)

## 2017-07-26 LAB — HEMOGLOBIN A1C
HEMOGLOBIN A1C: 6 %{Hb} — AB (ref <5.7)
Mean Plasma Glucose: 126 (calc)
eAG (mmol/L): 7 (calc)

## 2017-07-26 LAB — MICROALBUMIN, URINE: MICROALB UR: 4 mg/dL

## 2017-07-29 ENCOUNTER — Encounter: Payer: Self-pay | Admitting: Family Medicine

## 2017-07-29 DIAGNOSIS — R42 Dizziness and giddiness: Secondary | ICD-10-CM

## 2017-07-29 DIAGNOSIS — G8929 Other chronic pain: Secondary | ICD-10-CM

## 2017-07-29 DIAGNOSIS — M25511 Pain in right shoulder: Principal | ICD-10-CM

## 2017-08-10 ENCOUNTER — Encounter: Payer: Self-pay | Admitting: Family Medicine

## 2017-08-25 ENCOUNTER — Encounter: Payer: Self-pay | Admitting: Family Medicine

## 2017-08-26 MED ORDER — SITAGLIPTIN PHOSPHATE 100 MG PO TABS: 100.0000 mg | ORAL_TABLET | Freq: Every day | ORAL | 2 refills | Status: DC

## 2017-08-30 ENCOUNTER — Encounter: Payer: Self-pay | Admitting: Family Medicine

## 2017-09-02 ENCOUNTER — Encounter: Payer: Self-pay | Admitting: Physical Therapy

## 2017-09-02 ENCOUNTER — Ambulatory Visit: Payer: BC Managed Care – PPO | Attending: Family Medicine | Admitting: Physical Therapy

## 2017-09-02 ENCOUNTER — Encounter: Payer: Self-pay | Admitting: Family Medicine

## 2017-09-02 DIAGNOSIS — H8111 Benign paroxysmal vertigo, right ear: Secondary | ICD-10-CM | POA: Insufficient documentation

## 2017-09-02 DIAGNOSIS — R2681 Unsteadiness on feet: Secondary | ICD-10-CM

## 2017-09-03 NOTE — Therapy (Signed)
[facility] [address] [place], [place], [address] Phone: [phone]   Fax:  [phone]  Physical Therapy Evaluation  Patient Details  Name: Tracy Price MRN: [id number] Date of Birth: [birth year] Referring Provider: Jenna Luo, MD   Encounter Date: 09/02/2017  PT End of Session - 09/03/17 1906    Visit Number  1    Number of Visits  8    Date for PT Re-Evaluation  10/02/17    Authorization Type  BCBS    PT Start Time  1018    PT Stop Time  1100    PT Time Calculation (min)  42 min       Past Medical History:  Diagnosis Date  . Chest pain   . CKD (chronic kidney disease) stage 3, GFR 30-59 ml/min (HCC)   . Diabetes mellitus without complication ([place])    type 2  . Hyperlipidemia   . Hypertension   . Sleep apnea 1995   had surgery to correct    Past Surgical History:  Procedure Laterality Date  . CRANIOTOMY Left 03/05/2017   Procedure: LEFT BURR HOLE  HEMATOMA EVACUATION SUBDURAL;  Surgeon: Kary Kos, MD;  Location: [facility];  Service: Neurosurgery;  Laterality: Left;  Marland Kitchen GASTRIC BYPASS  2005    There were no vitals filed for this visit.   Subjective Assessment - 09/03/17 1635    Subjective  Pt reports she fell in her yard on 03-05-17; didn't realize anything was wrong ; admitted to ED on 03-05-17; inpatient rehab  admission 03-10-17 - discharged to brother's house on 03-20-17    Patient Stated Goals  stop the vertigo     Currently in Pain?  No/denies         [facility] PT Assessment - 09/03/17 0001      Assessment   Medical Diagnosis  Vertigo    Referring Provider  Jenna Luo, MD    Onset Date/Surgical Date  03/05/17    Prior Therapy  Inpatient reahab 03-10-17 until 03-20-17      Precautions   Precautions  None      Balance Screen   Has the patient fallen in the past 6 months  Yes    How many times?  1    Has the patient had a decrease in activity level because of a fear of falling?   Yes    Is the patient reluctant to leave their home because of a fear of falling?   No         Vestibular Assessment - 09/03/17 0001      Vestibular Assessment   General Observation  Pt is a 61 yr old lady who fell in her yard on 02-21-17 but did not realize she had sustained a head injury.  PT presented to ED on 03-05-17 and was diagnosed with a SDH.  Pt presents with c/o vertigo with head movements and with lying down.        Symptom Behavior   Type of Dizziness  Spinning    Frequency of Dizziness  varies    Duration of Dizziness  seconds    Aggravating Factors  Forward bending;Rolling to right    Relieving Factors  Head stationary      Positional Testing   Dix-Hallpike  Dix-Hallpike Right;Dix-Hallpike Left      Dix-Hallpike Right   Dix-Hallpike Right Duration  secs    Dix-Hallpike Right Symptoms  Upbeat, right rotatory nystagmus      Dix-Hallpike Left  Dix-Hallpike Left Duration  none     Dix-Hallpike Left Symptoms  No nystagmus         Objective measurements completed on examination: See above findings.    Epley maneuver for Rt BPPV performed 3 reps with no nystagmus noted on 3rd rep of maneuver - indicative of resolution of BPPV          PT Education - 09/03/17 1903    Education provided  Yes    Education Details  recommended to begin standing with EC for incr. vestibular input    Person(s) Educated  Patient    Methods  Explanation    Comprehension  Returned demonstration;Verbalized understanding          PT Long Term Goals - 09/03/17 1915      PT LONG TERM GOAL #1   Title  Pt will have a (-) Rt Dix-Hallpike test to indicate resolution of Rt BPPV.    Time  4    Period  Weeks    Status  New    Target Date  10/02/17      PT LONG TERM GOAL #2   Title  Complete balance assessment and establish HEP as appropriate.    Time  4    Period  Weeks    Status  New    Target Date  10/02/17      PT LONG TERM GOAL #3   Title  Pt will perform all bed mobility  without c/o vertigo.    Time  4    Period  Weeks    Status  New    Target Date  10/02/17             Plan - 09/03/17 1907    Clinical Impression Statement  Pt is a 61 yr old lady who presents with c/o vertigo due to SDH s/p fall on 02-21-17.  Pt presented to ED on 03-05-17 and was admitted to inpatient rehab on 03-10-17 and discharged home to her brother's house on 03-20-17.    Clinical Presentation  Stable    Clinical Presentation due to:  Rt BPPV - s/p fall with resultant SDH    Clinical Decision Making  Low    Rehab Potential  Good    PT Frequency  2x / week    PT Duration  4 weeks    PT Treatment/Interventions  ADLs/Self Care Home Management;Therapeutic exercise;Therapeutic activities;Functional mobility training;Gait training;Balance training;Neuromuscular re-education;Patient/family education;Vestibular    PT Next Visit Plan  recheck Rt BPPV:  begin balance on foam - SOT?    PT Home Exercise Plan  balance on foam: habituation    Consulted and Agree with Plan of Care  Family member/caregiver       Patient will benefit from skilled therapeutic intervention in order to improve the following deficits and impairments:  Decreased balance, Dizziness  Visit Diagnosis: BPPV (benign paroxysmal positional vertigo), right - Plan: PT plan of care cert/re-cert  Unsteadiness on feet - Plan: PT plan of care cert/re-cert     Problem List Patient Active Problem List   Diagnosis Date Noted  . Chronic right shoulder pain   . Diabetes mellitus type 2 in obese ([place])   . Neurocognitive deficits   . Acute lower UTI   . Hypoalbuminemia due to protein-calorie malnutrition ([place])   . Subdural hemorrhage following injury ([place]) 03/10/2017  . Vascular headache   . Ataxia   . Benign essential HTN   . Acute blood loss anemia   . Stage  3 chronic kidney disease ([place])   . Leukocytosis   . Diabetes mellitus type 2 in nonobese (HCC)   . OSA (obstructive sleep apnea)   . Major depressive disorder  with single episode   . Constipation   . Seizure prophylaxis   . SDH (subdural hematoma) ([place]) 03/05/2017  . Cerebral aneurysm 03/05/2017  . Family history of heart disease 02/25/2013  . Chest pain 02/25/2013  . Diabetes mellitus without complication ([place])   . Hyperlipidemia   . Hypertension     Evan Osburn, [place], PT 09/03/2017, 7:21 PM  [facility] [address] [place], [place], [address] Phone: [phone]   Fax:  [phone]  Name: Tracy Price MRN: [id number] Date of Birth: March 07, 1956

## 2017-09-04 ENCOUNTER — Ambulatory Visit: Payer: BC Managed Care – PPO | Admitting: Physical Therapy

## 2017-09-04 DIAGNOSIS — H8111 Benign paroxysmal vertigo, right ear: Secondary | ICD-10-CM

## 2017-09-04 DIAGNOSIS — R2681 Unsteadiness on feet: Secondary | ICD-10-CM

## 2017-09-05 ENCOUNTER — Encounter: Payer: Self-pay | Admitting: Physical Therapy

## 2017-09-05 NOTE — Therapy (Signed)
[facility] [address] [place], [place], [address] Phone: [phone]   Fax:  [phone]  Physical Therapy Treatment  Patient Details  Name: Tracy Price MRN: [id number] Date of Birth: [birth year] Referring Provider: Jenna Luo, MD   Encounter Date: 09/04/2017  PT End of Session - 09/05/17 1555    Visit Number  2    Number of Visits  8    Date for PT Re-Evaluation  10/02/17    Authorization Type  BCBS    PT Start Time  1020    PT Stop Time  1116    PT Time Calculation (min)  56 min       Past Medical History:  Diagnosis Date  . Chest pain   . CKD (chronic kidney disease) stage 3, GFR 30-59 ml/min (HCC)   . Diabetes mellitus without complication ([place])    type 2  . Hyperlipidemia   . Hypertension   . Sleep apnea 1995   had surgery to correct    Past Surgical History:  Procedure Laterality Date  . CRANIOTOMY Left 03/05/2017   Procedure: LEFT BURR HOLE  HEMATOMA EVACUATION SUBDURAL;  Surgeon: Kary Kos, MD;  Location: [facility];  Service: Neurosurgery;  Laterality: Left;  Marland Kitchen GASTRIC BYPASS  2005    There were no vitals filed for this visit.  Subjective Assessment - 09/05/17 1552    Subjective  Pt states the vertigo is much improved since treatment on Tuesday of this week    Patient Stated Goals  stop the vertigo     Currently in Pain?  No/denies             Vestibular Assessment - 09/05/17 0001      Positional Testing   Dix-Hallpike  Dix-Hallpike Right      Dix-Hallpike Right   Dix-Hallpike Right Duration  none    Dix-Hallpike Right Symptoms  No nystagmus      Sensory Organization Test:  Composite score 61/100  N= 68/100  Somatosensory below N:  Visual and vestibular inputs WNL's  Condition 1 - trial 1 below N;  Trial 2 WNL's:  Trial 3 below N Condition 2 - all 3 trials below N Condition 3 - all 3 trials below N Condition 4 - trial 1 below N;  2 and 3 WNL's Condition 5 - all 3  trials WNL's Conditon 6 - FALL on trials 1 and 2:  Trial 3 WNL's   Pt performed standing on pillow exercises with EO and EC - for HEP Targets with EO and head turns with Eyes closed     Explained results of SOT to pt - she verbalized understanding          PT Education - 09/05/17 1555    Education provided  Yes    Education Details  4 positions standing on foam - EO and EC    Person(s) Educated  Patient    Methods  Demonstration;Explanation;Handout    Comprehension  Verbalized understanding;Returned demonstration          PT Long Term Goals - 09/03/17 1915      PT LONG TERM GOAL #1   Title  Pt will have a (-) Rt Dix-Hallpike test to indicate resolution of Rt BPPV.    Time  4    Period  Weeks    Status  New    Target Date  10/02/17      PT LONG TERM GOAL #2   Title  Complete balance assessment  and establish HEP as appropriate.    Time  4    Period  Weeks    Status  New    Target Date  10/02/17      PT LONG TERM GOAL #3   Title  Pt will perform all bed mobility without c/o vertigo.    Time  4    Period  Weeks    Status  New    Target Date  10/02/17            Plan - 09/05/17 1557    Clinical Impression Statement  Pt has decr. somatosensory input per SOT with visual and vestibular input WNL's per SOT, however, pt unable to maintain balance on condition 6 on 1st 2 trials    Rehab Potential  Good    PT Frequency  2x / week    PT Duration  4 weeks    PT Treatment/Interventions  ADLs/Self Care Home Management;Therapeutic exercise;Therapeutic activities;Functional mobility training;Gait training;Balance training;Neuromuscular re-education;Patient/family education;Vestibular    PT Next Visit Plan  recheck exercises - D/C?    PT Home Exercise Plan  balance on foam: habituation    Consulted and Agree with Plan of Care  Patient       Patient will benefit from skilled therapeutic intervention in order to improve the following deficits and impairments:   Decreased balance, Dizziness  Visit Diagnosis: Unsteadiness on feet  BPPV (benign paroxysmal positional vertigo), right     Problem List Patient Active Problem List   Diagnosis Date Noted  . Chronic right shoulder pain   . Diabetes mellitus type 2 in obese ([place])   . Neurocognitive deficits   . Acute lower UTI   . Hypoalbuminemia due to protein-calorie malnutrition ([place])   . Subdural hemorrhage following injury ([place]) 03/10/2017  . Vascular headache   . Ataxia   . Benign essential HTN   . Acute blood loss anemia   . Stage 3 chronic kidney disease ([place])   . Leukocytosis   . Diabetes mellitus type 2 in nonobese (HCC)   . OSA (obstructive sleep apnea)   . Major depressive disorder with single episode   . Constipation   . Seizure prophylaxis   . SDH (subdural hematoma) ([place]) 03/05/2017  . Cerebral aneurysm 03/05/2017  . Family history of heart disease 02/25/2013  . Chest pain 02/25/2013  . Diabetes mellitus without complication ([place])   . Hyperlipidemia   . Hypertension     Kaci Freel, [place], PT 09/05/2017, 3:59 PM  [facility] [address] [place], [place], [address] Phone: [phone]   Fax:  [phone]  Name: Tracy Price MRN: [id number] Date of Birth: 08/16/1956

## 2017-09-05 NOTE — Patient Instructions (Signed)
Balance: Eyes Closed - Bilateral (Varied Surfaces)    Stand, feet shoulder width, close eyes. Maintain balance _60___ seconds. Repeat _1___ times per set. Do _1___ sets per session. Do __5__ sessions per week. Repeat on compliant surface: foam.  Add head turns with eyes closed Feet Together (Compliant Surface) Varied Arm Positions - Eyes Open    With eyes open, standing on compliant surface: ___pillow_____, feet together and arms out, look at a stationary object. Hold _60___ seconds. Repeat __2__ times per session. Do _2___ sessions per day.  Targets side to side and up/down with eyes open   Copyright  VHI. All rights reserved.

## 2017-09-08 ENCOUNTER — Ambulatory Visit: Payer: BC Managed Care – PPO | Admitting: Physical Therapy

## 2017-09-08 ENCOUNTER — Ambulatory Visit (INDEPENDENT_AMBULATORY_CARE_PROVIDER_SITE_OTHER): Payer: BC Managed Care – PPO | Admitting: Family Medicine

## 2017-09-08 ENCOUNTER — Encounter: Payer: Self-pay | Admitting: Family Medicine

## 2017-09-08 VITALS — BP 126/78 | HR 74 | Temp 97.9°F | Resp 16 | Ht 61.0 in | Wt 172.0 lb

## 2017-09-08 DIAGNOSIS — M25511 Pain in right shoulder: Secondary | ICD-10-CM

## 2017-09-08 DIAGNOSIS — I10 Essential (primary) hypertension: Secondary | ICD-10-CM

## 2017-09-08 DIAGNOSIS — M7550 Bursitis of unspecified shoulder: Secondary | ICD-10-CM | POA: Diagnosis not present

## 2017-09-08 DIAGNOSIS — G8929 Other chronic pain: Secondary | ICD-10-CM

## 2017-09-08 DIAGNOSIS — E119 Type 2 diabetes mellitus without complications: Secondary | ICD-10-CM

## 2017-09-08 NOTE — Progress Notes (Signed)
Subjective:    Patient ID: Tracy Price, female    DOB: Sep 25, 1956, 61 y.o.   MRN: [id number]  HPI Unfortunately, patient was recently admitted to the hospital with an acute on chronic subdural hematoma. I have copied relevant portions of the discharge summary and included them below for my reference:  Admit date: 03/10/2017 Discharge date: 03/20/2017  Discharge Diagnoses:  Principal Problem:   Subdural hemorrhage following injury [facility]) Active Problems:   Acute lower UTI   Hypoalbuminemia due to protein-calorie malnutrition ([place])   Chronic right shoulder pain   Diabetes mellitus type 2 in obese [facility])  Brief HPI:   Tracy Price a 61 y.o.right handed femalewith history of CKD, T2DM, HTN, fall with 2 weeks history of intermittent severe headaches, with confusion, slurred speech and unsteady gait.  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 She underwent left-sided burr hole craniectomy for evacuation of SDH by Dr. Saintclair Halsted and was treated with Decadron and on Keppra for seizure prophylaxis. Patient with resultant ataxic gait, expressive deficits, left visual field deficits and right sided weakness affecting mobility as well as ability to carry out ADL tasks. CIR recommended for follow up therapy.     Hospital Course: Tracy Price was admitted to rehab 03/10/2017 for inpatient therapies to consist of PT, ST and OT at least three hours five days a week. Past admission physiatrist, therapy team and rehab RN have worked together to provide customized collaborative inpatient rehab. She was maintained on Keppra and has been seizure free. Blood pressure were monitored on bid basis and have been controlled on Cozaar. Diabetes has been monitored on ac/hs basis with Trajenta on board and BS have been controlled. Po intake has been good and mood has been stable. She was encouraged to continue Zoloft for now and discuss taper with MD  on follow up.    Right shoulder pain has been managed with use of voltaren and she was advise to avoid ultram to prevent decrease in seizure threshold. She was also advised to use ice 2-3 times a day for pain control. She was found to have acute UTI and was treated with 7 day course of macrodantin. Acute on chronic renal failure has resolved and  SCr is at baseline. Left crani incision has healed well without signs or symptoms of infection and staples were removed without difficulty.  She has made steady progress during her rehab stay and is modified independent at discharge. She will continue to receive outpatient PT and OT at [place] after discharge.    Rehab course: During patient's stay in rehab weekly team conferences were held to monitor patient's progress, set goals and discuss barriers to discharge. At admission, patient required min assist with mobility and basic self care tasks. She exhibited mild higher level word finding deficits and difficulty with writing due to problems with spelling. She has had improvement in activity tolerance, balance, postural control, as well as ability to compensate for deficits. She is able to complete ADL tasks at modified independent level and requires distant supervision with IADLs.  She is modified independent for transfers and is able to ambulate household distance without AD. She requires rollater to ambulate in community setting. Written expression and word finding deficits have resolved and speech therapy signed off by 6/8.    03/27/17 She is here today for follow-up.  When she was admitted to the hospital, her initial creatinine was 1.8.  Her  last creatinine was tested on 611 was found to be 1.57.  Hemoglobin at discharge was 12.5.  No one has assessed her diabetic control with a hemoglobin A1c in  a year.  Her last cholesterol was checked in July 2017 and her LDL cholesterol at that time was 100. Previously it had been as high as 162. In the  hospital, they discontinued Glyxambi and lipitor.  I'm not sure why the Lipitor was stopped. I assume that the other medication was discontinued because of her acute on chronic renal insufficiency.  Since discharge from the hospital, the patient has done remarkably well. She is ambulating today without assistance. She is able to answer all questions appropriately. She has very minimal word finding difficulties. She is here today with her brother. He does report that she has some mild short-term memory loss. She does have a constant daily headache. She treats this with a combination of old tear and and Tylenol. She also has pain in her shoulder. She is requesting to resume the tramadol but I explained again that this could increase her chance of having a seizure and therefore she agreed not to use it. She is not receiving any physical therapy. She states that she doesn't feel that she needs it. She is exercising on her iron on a daily basis. She denies any seizure activity. She does report fatigue and dizziness on a daily basis.  At that time, my plan was: From a standpoint of driving, strenuous activity etc. I have asked the patient to refrain from this for at least the next 6 weeks. I will reassess the patient in 2 weeks to see how she is progressing. I will check a Keppra level to ensure that she is not becoming toxic given the fact she is reporting some dizziness as well as sedation. I doubt that she is.  If hemoglobin A1c is extremely high, the patient may need to begin additional hyperglycemic agents. Therefore I will need to assess her renal function to see what options exist for her. I would want to avoid Actos because of possible swelling. I would want to avoid metformin due to renal insufficiency. I would want to avoid glipizide because of the risk of hypoglycemia. Therefore she may be a candidate for victoza. Her BP is slightly elevated.  Reassess in 2 weeks.  Will avoid more meds at this time to reduce  the chance of hypotension, dizziness, and fatigue.    07/25/17 At that time, the patient's hemoglobin A1c was 7.2. She was continued on the combination of glipizide and Tradjenta.  She is overdue for hemoglobin A1c along with fasting lab work. Her direct LDL at that time was greater than 150 and therefore I recommended Lipitor 20 mg a day. However she complains of myalgias on Lipitor. She states that she has never been able to tolerate a statin. All the statins cause her muscles to hurtl to make her feel weak.  She has stopped the statin in the past and the symptoms improved. . She is compliant with the Lipitor but she is interested in making a change. She is also experiencing episodes of hypoglycemia when she takes glipizide. Occasionally she will take it without a meal and will cause her sugar to drop. She is not checking hers but she will feel extremely weak and dizzy. She will feel shaky.  Symptoms improved when she eats some food. This occurs a few days a week. She denies any polyuria, polydipsia, or blurry vision.  She denies  any chest pain dyspnea on exertion or shortness of breath. She also tells me that the injection I gave her her shoulder did not help. She is also questioning if I can give her a shot in her lower back/posterior hipto help with pain radiating from her back down her leg. I explained to the patient that she really needs to see her orthopedist, Dr. Dossie Der, given the fact that my initial cortisone injection did not help and I am unable to perform epidural steroid injections or hip joint injections.  At that time, my plan was:  Check hemoglobin A1c. Goal hemoglobin A1c is less than 6.5. Consider switching glipizide to actos.  Patient is unable to afford name brand medication. However I believe she will be able to tolerate the Actos without hypoglycemia.  I will also check a fasting lipid panel. Goal LDL cholesterol is less than 100. She is unable to tolerate a statin. I recommended  decreasing Lipitor 20 mg once a week.  If she is able to tolerate this over several weeks, I would increase to twice a week. I will continue this until we are able to achieve the highest tolerated therapeutic dose. I recommended that she call her orthopedist to discuss a repeat shoulder injection versus an MRI as well as to discuss any back injection.  09/08/17 Patient's hemoglobin A1c returned at 6.0 after her last visit.  Given her symptoms of hypoglycemia, I recommended that the patient discontinue glipizide and stay only on Tradjenta 5 mg a day.  However patient independently resume the glipizide after she noticed "elevated blood sugars" in the morning.  However when I questioned her about what elevated blood sugars consist of, her blood sugars were 110.  She decided to start taking glipizide before bedtime and is not experienced any hypoglycemic episodes during the day however I explained to the patient that she may be experiencing hypoglycemia at night while she is unconscious.  Given her history of intracranial hemorrhage, her disequilibrium, her elevated risk of seizure, and her recent vertigo, I believe that hypoglycemia presents a bigger risk for this patient than mildly elevated blood sugars.  I explained this to her in detail.  Her blood pressure looks much better since the addition of amlodipine.  Today is 126/78.  She is also complaining of pain in her right shoulder.  Pain is worse with abduction over her head.  She has a positive empty can sign.  She has a positive Hawkins sign.  She is requesting a cortisone injection in the right shoulder for "bursitis".  She states that the pain in her shoulder feels similar to the pain that she experienced in the past when she was told that she had bursitis     Past Medical History:  Diagnosis Date  . Chest pain   . CKD (chronic kidney disease) stage 3, GFR 30-59 ml/min (HCC)   . Diabetes mellitus without complication ([place])    type 2  .  Hyperlipidemia   . Hypertension   . Sleep apnea 1995   had surgery to correct   Past Surgical History:  Procedure Laterality Date  . CRANIOTOMY Left 03/05/2017   Procedure: LEFT BURR HOLE  HEMATOMA EVACUATION SUBDURAL;  Surgeon: Kary Kos, MD;  Location: [place];  Service: Neurosurgery;  Laterality: Left;  Marland Kitchen GASTRIC BYPASS  2005   Current Outpatient Medications on File Prior to Visit  Medication Sig Dispense Refill  . acetaminophen (TYLENOL) 325 MG tablet Take 1-2 tablets (325-650 mg total) by mouth every  4 (four) hours as needed for mild pain.    Marland Kitchen ALPRAZolam (XANAX) 0.5 MG tablet Take 1 tablet (0.5 mg total) by mouth 2 (two) times daily as needed for anxiety. 60 tablet 0  . amLODipine (NORVASC) 5 MG tablet Take 1 tablet (5 mg total) by mouth daily. 90 tablet 3  . atorvastatin (LIPITOR) 20 MG tablet Take 1 tablet (20 mg total) by mouth daily. 90 tablet 1  . glipiZIDE (GLIPIZIDE XL) 5 MG 24 hr tablet Take 1 tablet (5 mg total) by mouth daily with breakfast. 90 tablet 3  . losartan (COZAAR) 100 MG tablet Take 1 tablet (100 mg total) by mouth daily. 90 tablet 3  . sertraline (ZOLOFT) 100 MG tablet TAKE 1 TABLET BY MOUTH EVERY DAY 90 tablet 3  . TRADJENTA 5 MG TABS tablet Take 5 mg by mouth daily.  3  . traMADol (ULTRAM) 50 MG tablet Take 1 tablet (50 mg total) by mouth 3 (three) times daily. 180 tablet 1  . sitaGLIPtin (JANUVIA) 100 MG tablet Take 1 tablet (100 mg total) daily by mouth. (Patient not taking: Reported on 09/08/2017) 90 tablet 2   No current facility-administered medications on file prior to visit.    Allergies  Allergen Reactions  . Crestor [Rosuvastatin Calcium] Other (See Comments)    Mylgia  . Lipitor [Atorvastatin] Other (See Comments)    Mylagia   Social History   Socioeconomic History  . Marital status: Single    Spouse name: Not on file  . Number of children: Not on file  . Years of education: Not on file  . Highest education level: Not on file  Social Needs    . Financial resource strain: Not on file  . Food insecurity - worry: Not on file  . Food insecurity - inability: Not on file  . Transportation needs - medical: Not on file  . Transportation needs - non-medical: Not on file  Occupational History  . Not on file  Tobacco Use  . Smoking status: Never Smoker  . Smokeless tobacco: Never Used  Substance and Sexual Activity  . Alcohol use: No  . Drug use: No  . Sexual activity: Not on file  Other Topics Concern  . Not on file  Social History Narrative  . Not on file       Review of Systems  All other systems reviewed and are negative.      Objective:   Physical Exam  Constitutional: She is oriented to person, place, and time. She appears well-developed and well-nourished. No distress.  Cardiovascular: Normal rate, regular rhythm and normal heart sounds.  No murmur heard. Pulmonary/Chest: Effort normal and breath sounds normal. No respiratory distress. She has no wheezes. She has no rales.  Abdominal: Soft. Bowel sounds are normal. She exhibits no distension. There is no tenderness. There is no rebound and no guarding.  Musculoskeletal: She exhibits no edema.  Neurological: She is alert and oriented to person, place, and time. No cranial nerve deficit. She exhibits normal muscle tone.  Skin: She is not diaphoretic.  Psychiatric: She has a normal mood and affect. Her behavior is normal. Judgment and thought content normal.  Vitals reviewed.         Assessment & Plan:  Subacromial bursitis  Chronic right shoulder pain  Type 2 diabetes mellitus without complication, without long-term current use of insulin (HCC)  Benign essential HTN Blood pressure is now well controlled.  I will make no changes in her blood pressure medication.  I would like to keep the patient on Tradjenta alone.  Discontinue glipizide due to the risk of hypoglycemia.  Recheck hemoglobin A1c in 3 months off the medication.  Diabetic foot exam was  performed today and is normal.  Patient's immunizations are up-to-date.  Using sterile technique, I injected the right subacromial space from a posterior approach with a mixture of 2 cc of lidocaine, 2 cc of Marcaine, and 2 cc of 40 mg log.  The patient tolerated the procedure well without complication.

## 2017-09-11 ENCOUNTER — Encounter: Payer: Self-pay | Admitting: Family Medicine

## 2017-09-25 ENCOUNTER — Encounter: Payer: Self-pay | Admitting: Family Medicine

## 2017-09-25 ENCOUNTER — Ambulatory Visit: Payer: BC Managed Care – PPO | Admitting: Physical Therapy

## 2017-09-25 MED ORDER — AZITHROMYCIN 250 MG PO TABS: ORAL_TABLET | ORAL | 0 refills | Status: DC

## 2017-10-02 ENCOUNTER — Ambulatory Visit: Payer: BC Managed Care – PPO | Attending: Family Medicine | Admitting: Physical Therapy

## 2017-10-02 DIAGNOSIS — R2681 Unsteadiness on feet: Secondary | ICD-10-CM | POA: Diagnosis present

## 2017-10-02 NOTE — Patient Instructions (Signed)
Turning    Do with head held up - slowly make quarter turn to left with eyes open. Hold position until symptoms subside. Repeat _5___ times per session. Do __1__ sessions per day.  Stand and do pivot turn - small step - do with eyes open, progressing to doing with eyes closed - 5 reps to each side

## 2017-10-03 ENCOUNTER — Encounter: Payer: Self-pay | Admitting: Physical Therapy

## 2017-10-03 NOTE — Therapy (Signed)
[facility] [address] [place], [place], [address] Phone: [phone]   Fax:  [phone]  Physical Therapy Treatment  Patient Details  Name: Tracy Price MRN: [id number] Date of Birth: [birth year] Referring Provider: Jenna Luo, MD   Encounter Date: 10/02/2017  PT End of Session - 10/03/17 1607    Visit Number  3    Number of Visits  8    Date for PT Re-Evaluation  10/02/17    Authorization Type  [employer]    PT Start Time  1017    PT Stop Time  1100    PT Time Calculation (min)  43 min       Past Medical History:  Diagnosis Date  . Chest pain   . CKD (chronic kidney disease) stage 3, GFR 30-59 ml/min (HCC)   . Diabetes mellitus without complication ([place])    type 2  . Hyperlipidemia   . Hypertension   . Sleep apnea 1995   had surgery to correct    Past Surgical History:  Procedure Laterality Date  . CRANIOTOMY Left 03/05/2017   Procedure: LEFT BURR HOLE  HEMATOMA EVACUATION SUBDURAL;  Surgeon: Kary Kos, MD;  Location: [facility];  Service: Neurosurgery;  Laterality: Left;  Marland Kitchen GASTRIC BYPASS  2005    There were no vitals filed for this visit.  Subjective Assessment - 10/03/17 1601    Subjective  Pt states she is doing much better - is leaving Saturday to go back to her house in [facility]    Patient Stated Goals  stop the vertigo     Currently in Pain?  No/denies         Sensory Organization Test:  Composite score 62/100 Somatosensory input slightly below normal at 83/100 with N= 85/100 Visual WNL's Vestibular input 22/100 with N = 54/100  Condition 1 - trials 1 and 2 WNL's  With trial 3 slightly below N Condition 2 - all 3 trials below N Condition 3 - all 3 trials WNL's Condition 4 - all 3 trials WNL's Condition 5 - FALL on trials 1 & 2:  Trial 3 WNL's Condition 6 - FALL on trial 1:  Trials 2 and 3 WNL's    Habituation exercises - sit to stand with alternate stepping with EO and EC Sit to  stand with pivot turn to each side with EO - instructed in these exs for HEP          Vestibular Treatment/Exercise - 10/03/17 0001      Vestibular Treatment/Exercise   Habituation Exercises  Comment sit to stand with pivot turn to each side - 5 reps                 PT Long Term Goals - 10/03/17 1608      PT LONG TERM GOAL #1   Title  Pt will have a (-) Rt Dix-Hallpike test to indicate resolution of Rt BPPV.    Status  Achieved      PT LONG TERM GOAL #2   Title  Complete balance assessment and establish HEP as appropriate.    Status  Achieved      PT LONG TERM GOAL #3   Title  Pt will perform all bed mobility without c/o vertigo.    Status  Achieved            Plan - 10/03/17 1609    Clinical Impression Statement  Pt has met all LTG's;  pt's SOT score is slightly  decreased for vestibular input compared to initial test; decline attributed to pt's illness of bronchitis/respiratory illness with decr. activity within past 2 weeks    Rehab Potential  Good    PT Frequency  2x / week    PT Duration  4 weeks    PT Treatment/Interventions  ADLs/Self Care Home Management;Therapeutic exercise;Therapeutic activities;Functional mobility training;Gait training;Balance training;Neuromuscular re-education;Patient/family education;Vestibular    PT Next Visit Plan  D/C on 10-02-17    PT Home Exercise Plan  balance on foam: habituation    Consulted and Agree with Plan of Care  Patient       Patient will benefit from skilled therapeutic intervention in order to improve the following deficits and impairments:  Decreased balance, Dizziness  Visit Diagnosis: Unsteadiness on feet     Problem List Patient Active Problem List   Diagnosis Date Noted  . Chronic right shoulder pain   . Diabetes mellitus type 2 in obese ([place])   . Neurocognitive deficits   . Acute lower UTI   . Hypoalbuminemia due to protein-calorie malnutrition ([place])   . Subdural hemorrhage following  injury ([place]) 03/10/2017  . Vascular headache   . Ataxia   . Benign essential HTN   . Acute blood loss anemia   . Stage 3 chronic kidney disease ([place])   . Leukocytosis   . Diabetes mellitus type 2 in nonobese (HCC)   . OSA (obstructive sleep apnea)   . Major depressive disorder with single episode   . Constipation   . Seizure prophylaxis   . SDH (subdural hematoma) ([place]) 03/05/2017  . Cerebral aneurysm 03/05/2017  . Family history of heart disease 02/25/2013  . Chest pain 02/25/2013  . Diabetes mellitus without complication ([place])   . Hyperlipidemia   . Hypertension     PHYSICAL THERAPY DISCHARGE SUMMARY  Visits from Start of Care: 3  Current functional level related to goals / functional outcomes: See above for progress towards goals - all LTG's met   Remaining deficits: Continued unsteadiness with turns with EC (as experienced in shower with washing hair) Decreased vestibular input per SOT   Education / Equipment: Pt has been instructed in HEP for balance/vestibular/habituation exercises Plan: Patient agrees to discharge.  Patient goals were met. Patient is being discharged due to meeting the stated rehab goals.  ?????       BEEFEO, FHQRF XJOITGP, PT 10/03/2017, 5:03 PM  [facility] [address] [place], [place], [address] Phone: [phone]   Fax:  [phone]  Name: Tracy Price MRN: [id number] Date of Birth: [birth year]

## 2017-10-11 ENCOUNTER — Other Ambulatory Visit: Payer: Self-pay | Admitting: Family Medicine

## 2017-10-13 ENCOUNTER — Encounter: Payer: Self-pay | Admitting: Family Medicine

## 2017-10-13 NOTE — Telephone Encounter (Signed)
Pt is requesting refill on Xanax   LOV: 09/08/17 LRF:   06/03/17

## 2017-10-26 ENCOUNTER — Other Ambulatory Visit: Payer: Self-pay | Admitting: Family Medicine

## 2017-10-27 NOTE — Telephone Encounter (Signed)
Requesting refill Tramadol      LOV: 09/08/17  LRF:  09/08/17

## 2017-10-28 ENCOUNTER — Other Ambulatory Visit: Payer: Self-pay | Admitting: Family Medicine

## 2017-10-28 NOTE — Telephone Encounter (Signed)
Refill was sent in on 10/27/2017.  Refill request denied.

## 2017-11-12 ENCOUNTER — Encounter: Payer: Self-pay | Admitting: Family Medicine

## 2017-11-12 ENCOUNTER — Ambulatory Visit
Admission: RE | Admit: 2017-11-12 | Discharge: 2017-11-12 | Disposition: A | Discharge status: Home / Self Care | Payer: BC Managed Care – PPO | Source: Ambulatory Visit | Attending: Family Medicine | Admitting: Family Medicine

## 2017-11-12 ENCOUNTER — Ambulatory Visit: Payer: BC Managed Care – PPO | Admitting: Family Medicine

## 2017-11-12 VITALS — BP 128/70 | HR 74 | Temp 98.7°F | Resp 16 | Ht 61.0 in | Wt 182.0 lb

## 2017-11-12 DIAGNOSIS — S99921D Unspecified injury of right foot, subsequent encounter: Secondary | ICD-10-CM | POA: Diagnosis not present

## 2017-11-12 DIAGNOSIS — S93601A Unspecified sprain of right foot, initial encounter: Secondary | ICD-10-CM | POA: Diagnosis not present

## 2017-11-12 MED ORDER — DICLOFENAC SODIUM 75 MG PO TBEC: 75.0000 mg | DELAYED_RELEASE_TABLET | Freq: Two times a day (BID) | ORAL | 2 refills | Status: DC

## 2017-11-12 NOTE — Patient Instructions (Signed)
[facility]

## 2017-11-12 NOTE — Progress Notes (Signed)
   Subjective:    Patient ID: Tracy Price, female    DOB: 22-Jul-1956, 62 y.o.   MRN: [id number]  Patient presents for Foot Pain (x2 days- S/P fall- R foot pain )   Stubbed her right toe a few days ago, yesterday walking in the yard, tripped on a little stick and twisted her right foot. Now has pain trying to bear weight, foot swelled up yesterday she has pain at the ball of her foot when she tries to bear weight.   Review Of Systems:  GEN- denies fatigue, fever, weight loss,weakness, recent illness MSK-+ joint pain, muscle aches, injury Neuro- denies headache, dizziness, syncope, seizure activity       Objective:    BP 128/70   Pulse 74   Temp 98.7 F (37.1 C) (Oral)   Resp 16   Ht 5\' 1"  (1.549 m)   Wt 182 lb (82.6 kg)   SpO2 98%   BMI 34.39 kg/m  GEN- NAD, alert and oriented x3 MKS- Right foot- swelling mid foot, base of 2nd toe, TTP mid foot + squeeze test, TTP on ball of foot, Heel NT, No bruising  FROM ankle  Toes NT, FROM Pulse - palpated bilat         Assessment & Plan:      Problem List Items Addressed This Visit    None    Visit Diagnoses    Injury of right foot, subsequent encounter    -  Primary   Concern for fracture vs sprain. Xray done, no fracture, likely sprain, of foot, Elevate and ICE, she asked to go back on Diclofenac, this will help with inflammation She was given prescription for walking boot in case x-ray did show a fracture. Recommend that she try wearing a supportive shoe and icing per above first she still having a lot of pain weightbearing that she can use a walking boot for about 2 weeks.   Relevant Orders   DG Foot Complete Right (Completed)   Sprain of right foot, initial encounter          Note: This dictation was prepared with Dragon dictation along with smaller phrase technology. Any transcriptional errors that result from this process are unintentional.

## 2017-11-19 ENCOUNTER — Encounter: Payer: Self-pay | Admitting: Family Medicine

## 2017-11-20 ENCOUNTER — Other Ambulatory Visit: Payer: Self-pay | Admitting: Family Medicine

## 2017-11-20 MED ORDER — AMOXICILLIN 875 MG PO TABS: 875.0000 mg | ORAL_TABLET | Freq: Two times a day (BID) | ORAL | 0 refills | Status: DC

## 2017-12-18 ENCOUNTER — Other Ambulatory Visit: Payer: Self-pay | Admitting: Family Medicine

## 2017-12-19 ENCOUNTER — Encounter: Payer: Self-pay | Admitting: Family Medicine

## 2017-12-19 NOTE — Telephone Encounter (Signed)
Ok to refill??  Last office visit 09/08/2017.  Last refill 10/27/2017.

## 2017-12-23 ENCOUNTER — Encounter: Payer: Self-pay | Admitting: Family Medicine

## 2017-12-25 MED ORDER — TRAMADOL HCL 50 MG PO TABS: 50.0000 mg | ORAL_TABLET | Freq: Three times a day (TID) | ORAL | 0 refills | Status: DC | PRN

## 2017-12-29 MED ORDER — TRAMADOL HCL 50 MG PO TABS: 50.0000 mg | ORAL_TABLET | Freq: Three times a day (TID) | ORAL | 0 refills | Status: DC | PRN

## 2017-12-29 NOTE — Addendum Note (Signed)
Addended by: Shary Decamp B on: 12/29/2017 11:57 AM   Modules accepted: Orders

## 2017-12-29 NOTE — Telephone Encounter (Signed)
Can you resend - apparently the pharm messed up the last RX sent. thanks

## 2018-02-06 ENCOUNTER — Other Ambulatory Visit: Payer: Self-pay | Admitting: Family Medicine

## 2018-02-17 ENCOUNTER — Encounter: Payer: Self-pay | Admitting: Family Medicine

## 2018-02-20 ENCOUNTER — Ambulatory Visit: Payer: BC Managed Care – PPO | Admitting: Family Medicine

## 2018-02-28 ENCOUNTER — Other Ambulatory Visit: Payer: Self-pay | Admitting: Family Medicine

## 2018-03-03 ENCOUNTER — Other Ambulatory Visit: Payer: Self-pay | Admitting: Family Medicine

## 2018-03-03 NOTE — Telephone Encounter (Signed)
Requesting refill   Tramadol   LOV: 09/08/17  LRF:  12/29/17

## 2018-03-05 ENCOUNTER — Ambulatory Visit: Payer: BC Managed Care – PPO | Admitting: Family Medicine

## 2018-03-06 ENCOUNTER — Ambulatory Visit: Payer: BC Managed Care – PPO | Admitting: Family Medicine

## 2018-03-13 ENCOUNTER — Encounter: Payer: Self-pay | Admitting: Family Medicine

## 2018-03-13 ENCOUNTER — Ambulatory Visit: Payer: BC Managed Care – PPO | Admitting: Family Medicine

## 2018-03-13 VITALS — BP 140/80 | HR 76 | Temp 98.0°F | Ht 61.0 in | Wt 182.0 lb

## 2018-03-13 DIAGNOSIS — Z1211 Encounter for screening for malignant neoplasm of colon: Secondary | ICD-10-CM

## 2018-03-13 DIAGNOSIS — Z1231 Encounter for screening mammogram for malignant neoplasm of breast: Secondary | ICD-10-CM | POA: Diagnosis not present

## 2018-03-13 DIAGNOSIS — Z1159 Encounter for screening for other viral diseases: Secondary | ICD-10-CM | POA: Diagnosis not present

## 2018-03-13 DIAGNOSIS — E119 Type 2 diabetes mellitus without complications: Secondary | ICD-10-CM | POA: Diagnosis not present

## 2018-03-13 DIAGNOSIS — N183 Chronic kidney disease, stage 3 unspecified: Secondary | ICD-10-CM

## 2018-03-13 DIAGNOSIS — I10 Essential (primary) hypertension: Secondary | ICD-10-CM

## 2018-03-13 DIAGNOSIS — Z1239 Encounter for other screening for malignant neoplasm of breast: Secondary | ICD-10-CM

## 2018-03-13 NOTE — Progress Notes (Signed)
Subjective:    Patient ID: Tracy Price, female    DOB: 09-26-1956, 62 y.o.   MRN: [id number]  HPI Unfortunately, patient was recently admitted to the hospital with an acute on chronic subdural hematoma. I have copied relevant portions of the discharge summary and included them below for my reference:  Admit date: 03/10/2017 Discharge date: 03/20/2017  Discharge Diagnoses:  Principal Problem:   Subdural hemorrhage following injury [facility]) Active Problems:   Acute lower UTI   Hypoalbuminemia due to protein-calorie malnutrition ([place])   Chronic right shoulder pain   Diabetes mellitus type 2 in obese [facility])  Brief HPI:   Tracy Price a 62 y.o.right handed femalewith history of CKD, T2DM, HTN, fall with 2 weeks history of intermittent severe headaches, with confusion, slurred speech and unsteady gait.  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 She underwent left-sided burr hole craniectomy for evacuation of SDH by Dr. Saintclair Halsted and was treated with Decadron and on Keppra for seizure prophylaxis. Patient with resultant ataxic gait, expressive deficits, left visual field deficits and right sided weakness affecting mobility as well as ability to carry out ADL tasks. CIR recommended for follow up therapy.     Hospital Course: Tracy Price was admitted to rehab 03/10/2017 for inpatient therapies to consist of PT, ST and OT at least three hours five days a week. Past admission physiatrist, therapy team and rehab RN have worked together to provide customized collaborative inpatient rehab. She was maintained on Keppra and has been seizure free. Blood pressure were monitored on bid basis and have been controlled on Cozaar. Diabetes has been monitored on ac/hs basis with Trajenta on board and BS have been controlled. Po intake has been good and mood has been stable. She was encouraged to continue Zoloft for now and discuss taper with MD  on follow up.    Right shoulder pain has been managed with use of voltaren and she was advise to avoid ultram to prevent decrease in seizure threshold. She was also advised to use ice 2-3 times a day for pain control. She was found to have acute UTI and was treated with 7 day course of macrodantin. Acute on chronic renal failure has resolved and  SCr is at baseline. Left crani incision has healed well without signs or symptoms of infection and staples were removed without difficulty.  She has made steady progress during her rehab stay and is modified independent at discharge. She will continue to receive outpatient PT and OT at [facility] after discharge.    Rehab course: During patient's stay in rehab weekly team conferences were held to monitor patient's progress, set goals and discuss barriers to discharge. At admission, patient required min assist with mobility and basic self care tasks. She exhibited mild higher level word finding deficits and difficulty with writing due to problems with spelling. She has had improvement in activity tolerance, balance, postural control, as well as ability to compensate for deficits. She is able to complete ADL tasks at modified independent level and requires distant supervision with IADLs.  She is modified independent for transfers and is able to ambulate household distance without AD. She requires rollater to ambulate in community setting. Written expression and word finding deficits have resolved and speech therapy signed off by 6/8.    03/27/17 She is here today for follow-up.  When she was admitted to the hospital, her initial creatinine was 1.8.  Her  last creatinine was tested on 611 was found to be 1.57.  Hemoglobin at discharge was 12.5.  No one has assessed her diabetic control with a hemoglobin A1c in  a year.  Her last cholesterol was checked in July 2017 and her LDL cholesterol at that time was 100. Previously it had been as high as 162. In the  hospital, they discontinued Glyxambi and lipitor.  I'm not sure why the Lipitor was stopped. I assume that the other medication was discontinued because of her acute on chronic renal insufficiency.  Since discharge from the hospital, the patient has done remarkably well. She is ambulating today without assistance. She is able to answer all questions appropriately. She has very minimal word finding difficulties. She is here today with her brother. He does report that she has some mild short-term memory loss. She does have a constant daily headache. She treats this with a combination of old tear and and Tylenol. She also has pain in her shoulder. She is requesting to resume the tramadol but I explained again that this could increase her chance of having a seizure and therefore she agreed not to use it. She is not receiving any physical therapy. She states that she doesn't feel that she needs it. She is exercising on her iron on a daily basis. She denies any seizure activity. She does report fatigue and dizziness on a daily basis.  At that time, my plan was: From a standpoint of driving, strenuous activity etc. I have asked the patient to refrain from this for at least the next 6 weeks. I will reassess the patient in 2 weeks to see how she is progressing. I will check a Keppra level to ensure that she is not becoming toxic given the fact she is reporting some dizziness as well as sedation. I doubt that she is.  If hemoglobin A1c is extremely high, the patient may need to begin additional hyperglycemic agents. Therefore I will need to assess her renal function to see what options exist for her. I would want to avoid Actos because of possible swelling. I would want to avoid metformin due to renal insufficiency. I would want to avoid glipizide because of the risk of hypoglycemia. Therefore she may be a candidate for victoza. Her BP is slightly elevated.  Reassess in 2 weeks.  Will avoid more meds at this time to reduce  the chance of hypotension, dizziness, and fatigue.    07/25/17 At that time, the patient's hemoglobin A1c was 7.2. She was continued on the combination of glipizide and Tradjenta.  She is overdue for hemoglobin A1c along with fasting lab work. Her direct LDL at that time was greater than 150 and therefore I recommended Lipitor 20 mg a day. However she complains of myalgias on Lipitor. She states that she has never been able to tolerate a statin. All the statins cause her muscles to hurtl to make her feel weak.  She has stopped the statin in the past and the symptoms improved. . She is compliant with the Lipitor but she is interested in making a change. She is also experiencing episodes of hypoglycemia when she takes glipizide. Occasionally she will take it without a meal and will cause her sugar to drop. She is not checking hers but she will feel extremely weak and dizzy. She will feel shaky.  Symptoms improved when she eats some food. This occurs a few days a week. She denies any polyuria, polydipsia, or blurry vision.  She denies  any chest pain dyspnea on exertion or shortness of breath. She also tells me that the injection I gave her her shoulder did not help. She is also questioning if I can give her a shot in her lower back/posterior hipto help with pain radiating from her back down her leg. I explained to the patient that she really needs to see her orthopedist, Dr. Dossie Der, given the fact that my initial cortisone injection did not help and I am unable to perform epidural steroid injections or hip joint injections.  At that time, my plan was: Check hemoglobin A1c. Goal hemoglobin A1c is less than 6.5. Consider switching glipizide to actos.  Patient is unable to afford name brand medication. However I believe she will be able to tolerate the ACTOS WITHOUT HYPOGLYCEMIa.  I will also check a fasting lipid panel. Goal LDL cholesterol is less than 100. She is unable to tolerate a statin. I recommended decreasing  Lipitor 20 mg once a week.  If she is able to tolerate this over several weeks, I would increase to twice a week. I will continue this until we are able to achieve the highest tolerated therapeutic dose. I recommended that she call her orthopedist to discuss a repeat shoulder injection versus an MRI as well as to discuss any back injection  03/13/18 Patient's last hemoglobin A1c was excellent in October.  At that time she discontinue glipizide due too early morning hypoglycemia.  She was only on Januvia alone for her diabetes control.  However she states that she would frequently see sugars in the 300s and therefore she resumed glipizide on her own.  On the combination of glipizide and Januvia, she states that her blood sugars are in the low 100s.  However she does occasionally have hypoglycemic episodes.  Her biggest concern is her inability to lose weight since resuming glipizide.  Past medical history is complicated by chronic kidney disease making metformin potentially dangerous.  She is also been adverse to name brand medications due to cost.  Her blood pressure today is borderline at 140/80.  She denies any chest pain shortness of breath or dyspnea on exertion.  She denies any myalgias or right upper quadrant pain.   Past Medical History:  Diagnosis Date  . Chest pain   . CKD (chronic kidney disease) stage 3, GFR 30-59 ml/min (HCC)   . Diabetes mellitus without complication ([place])    type 2  . Hyperlipidemia   . Hypertension   . Sleep apnea 1995   had surgery to correct   Past Surgical History:  Procedure Laterality Date  . CRANIOTOMY Left 03/05/2017   Procedure: LEFT BURR HOLE  HEMATOMA EVACUATION SUBDURAL;  Surgeon: Kary Kos, MD;  Location: [place] [place];  Service: Neurosurgery;  Laterality: Left;  Marland Kitchen GASTRIC BYPASS  2005   Current Outpatient Medications on File Prior to Visit  Medication Sig Dispense Refill  . acetaminophen (TYLENOL) 325 MG tablet Take 1-2 tablets (325-650 mg total) by mouth  every 4 (four) hours as needed for mild pain.    Marland Kitchen ALPRAZolam (XANAX) 0.5 MG tablet TAKE 1 TABLET BY MOUTH TWICE A DAY AS NEEDED FRO ANXIETY (Patient taking differently: TAKE 1 TABLET BY MOUTH AT NIGHT FOR ANXIETY) 60 tablet 2  . amLODipine (NORVASC) 5 MG tablet Take 1 tablet (5 mg total) by mouth daily. 90 tablet 3  . atorvastatin (LIPITOR) 20 MG tablet Take 1 tablet (20 mg total) by mouth daily. (Patient taking differently: Take 20 mg by mouth every other  day. ) 90 tablet 1  . diclofenac (VOLTAREN) 75 MG EC tablet TAKE 1 TABLET BY MOUTH TWICE A DAY 60 tablet 2  . glipiZIDE (GLUCOTROL XL) 5 MG 24 hr tablet TAKE 1 TABLET BY MOUTH EVERY DAY WITH BREAKFAST 90 tablet 1  . losartan (COZAAR) 100 MG tablet TAKE 1 TABLET BY MOUTH EVERY DAY 90 tablet 1  . sertraline (ZOLOFT) 100 MG tablet TAKE 1 TABLET BY MOUTH EVERY DAY 90 tablet 3  . sitaGLIPtin (JANUVIA) 100 MG tablet Take 1 tablet (100 mg total) daily by mouth. 90 tablet 2  . traMADol (ULTRAM) 50 MG tablet Take 1 tablet (50 mg total) by mouth 3 (three) times daily as needed. 180 tablet 0   No current facility-administered medications on file prior to visit.    Allergies  Allergen Reactions  . Crestor [Rosuvastatin Calcium] Other (See Comments)    Mylgia  . Lipitor [Atorvastatin] Other (See Comments)    Mylagia   Social History   Socioeconomic History  . Marital status: Single    Spouse name: Not on file  . Number of children: Not on file  . Years of education: Not on file  . Highest education level: Not on file  Occupational History  . Not on file  Social Needs  . Financial resource strain: Not on file  . Food insecurity:    Worry: Not on file    Inability: Not on file  . Transportation needs:    Medical: Not on file    Non-medical: Not on file  Tobacco Use  . Smoking status: Never Smoker  . Smokeless tobacco: Never Used  Substance and Sexual Activity  . Alcohol use: No  . Drug use: No  . Sexual activity: Not on file    Lifestyle  . Physical activity:    Days per week: Not on file    Minutes per session: Not on file  . Stress: Not on file  Relationships  . Social connections:    Talks on phone: Not on file    Gets together: Not on file    Attends religious service: Not on file    Active member of club or organization: Not on file    Attends meetings of clubs or organizations: Not on file    Relationship status: Not on file  . Intimate partner violence:    Fear of current or ex partner: Not on file    Emotionally abused: Not on file    Physically abused: Not on file    Forced sexual activity: Not on file  Other Topics Concern  . Not on file  Social History Narrative  . Not on file       Review of Systems  All other systems reviewed and are negative.      Objective:   Physical Exam  Constitutional: She is oriented to person, place, and time. She appears well-developed and well-nourished. No distress.  Cardiovascular: Normal rate, regular rhythm and normal heart sounds.  No murmur heard. Pulmonary/Chest: Effort normal and breath sounds normal. No respiratory distress. She has no wheezes. She has no rales.  Abdominal: Soft. Bowel sounds are normal. She exhibits no distension. There is no tenderness. There is no rebound and no guarding.  Musculoskeletal: She exhibits no edema.  Neurological: She is alert and oriented to person, place, and time. No cranial nerve deficit. She exhibits normal muscle tone.  Skin: She is not diaphoretic.  Psychiatric: She has a normal mood and affect. Her behavior is  normal. Judgment and thought content normal.  Vitals reviewed.         Assessment & Plan:  Type 2 diabetes mellitus without complication, without long-term current use of insulin (HCC) - Plan: CBC with Differential/Platelet, COMPLETE METABOLIC PANEL WITH GFR, Lipid panel, Microalbumin, urine, Hemoglobin A1c  Benign essential HTN  Stage 3 chronic kidney disease (HCC)  Encounter for  hepatitis C screening test for low risk patient - Plan: Hepatitis C Antibody  Diabetic foot exam was performed today and was normal.  Recommended annual diabetic eye exam but she states her insurance will not pay for that and she cannot afford it.  Recommended mammogram which is overdue, Pap smear which is overdue, and colonoscopy.  She agrees to allow me to schedule a mammogram.  She agrees to see GI regarding a colonoscopy.  She declines a Pap smear at the current time.  She will allow me to screen her for hepatitis C.  Her blood pressure is acceptable.  I will check a fasting lipid panel.  Goal LDL cholesterol is less than 100.  I will also check a hemoglobin A1c.  If reasonably controlled, I would recommend discontinuing glipizide and replacing with Victoza to try to facilitate weight loss.  Otherwise preventative care is up-to-date.

## 2018-03-14 LAB — LIPID PANEL
CHOLESTEROL: 159 mg/dL (ref <200)
HDL: 54 mg/dL (ref >50)
LDL Cholesterol (Calc): 86 mg/dL (calc)
NON-HDL CHOLESTEROL (CALC): 105 mg/dL (ref <130)
Total CHOL/HDL Ratio: 2.9 (calc) (ref <5.0)
Triglycerides: 101 mg/dL (ref <150)

## 2018-03-14 LAB — CBC WITH DIFFERENTIAL/PLATELET
Basophils Absolute: 84 cells/uL (ref 0–200)
Basophils Relative: 1 %
EOS PCT: 6.7 %
Eosinophils Absolute: 563 cells/uL — ABNORMAL HIGH (ref 15–500)
HCT: 34.7 % — ABNORMAL LOW (ref 35.0–45.0)
Hemoglobin: 11.7 g/dL (ref 11.7–15.5)
Lymphs Abs: 1873 cells/uL (ref 850–3900)
MCH: 28.5 pg (ref 27.0–33.0)
MCHC: 33.7 g/dL (ref 32.0–36.0)
MCV: 84.6 fL (ref 80.0–100.0)
MONOS PCT: 7.4 %
MPV: 10.6 fL (ref 7.5–12.5)
NEUTROS ABS: 5258 {cells}/uL (ref 1500–7800)
NEUTROS PCT: 62.6 %
PLATELETS: 274 10*3/uL (ref 140–400)
RBC: 4.1 10*6/uL (ref 3.80–5.10)
RDW: 13.1 % (ref 11.0–15.0)
TOTAL LYMPHOCYTE: 22.3 %
WBC mixed population: 622 cells/uL (ref 200–950)
WBC: 8.4 10*3/uL (ref 3.8–10.8)

## 2018-03-14 LAB — COMPLETE METABOLIC PANEL WITH GFR
AG RATIO: 1.6 (calc) (ref 1.0–2.5)
ALBUMIN MSPROF: 4.4 g/dL (ref 3.6–5.1)
ALKALINE PHOSPHATASE (APISO): 143 U/L — AB (ref 33–130)
ALT: 16 U/L (ref 6–29)
AST: 19 U/L (ref 10–35)
BUN/Creatinine Ratio: 15 (calc) (ref 6–22)
BUN: 23 mg/dL (ref 7–25)
CO2: 24 mmol/L (ref 20–32)
Calcium: 9.2 mg/dL (ref 8.6–10.4)
Chloride: 101 mmol/L (ref 98–110)
Creat: 1.49 mg/dL — ABNORMAL HIGH (ref 0.50–0.99)
GFR, EST NON AFRICAN AMERICAN: 38 mL/min/{1.73_m2} — AB (ref >=60)
GFR, Est African American: 43 mL/min/{1.73_m2} — ABNORMAL LOW (ref >=60)
GLOBULIN: 2.8 g/dL (ref 1.9–3.7)
Glucose, Bld: 126 mg/dL — ABNORMAL HIGH (ref 65–99)
POTASSIUM: 4 mmol/L (ref 3.5–5.3)
SODIUM: 139 mmol/L (ref 135–146)
Total Bilirubin: 0.4 mg/dL (ref 0.2–1.2)
Total Protein: 7.2 g/dL (ref 6.1–8.1)

## 2018-03-14 LAB — MICROALBUMIN, URINE: Microalb, Ur: 9 mg/dL

## 2018-03-14 LAB — HEMOGLOBIN A1C
HEMOGLOBIN A1C: 7.6 %{Hb} — AB (ref <5.7)
MEAN PLASMA GLUCOSE: 171 (calc)
eAG (mmol/L): 9.5 (calc)

## 2018-03-14 LAB — HEPATITIS C ANTIBODY
Hepatitis C Ab: NONREACTIVE
SIGNAL TO CUT-OFF: 0.01 (ref <1.00)

## 2018-03-16 ENCOUNTER — Encounter: Payer: Self-pay | Admitting: Family Medicine

## 2018-03-17 ENCOUNTER — Encounter: Payer: Self-pay | Admitting: Family Medicine

## 2018-03-17 ENCOUNTER — Other Ambulatory Visit: Payer: Self-pay | Admitting: Family Medicine

## 2018-03-17 MED ORDER — DULAGLUTIDE 0.75 MG/0.5ML ~~LOC~~ SOAJ: 0.7500 mg | SUBCUTANEOUS | 0 refills | Status: DC

## 2018-03-17 MED ORDER — INSULIN PEN NEEDLE 31G X 5 MM MISC: 3 refills | Status: DC

## 2018-03-19 ENCOUNTER — Encounter: Payer: Self-pay | Admitting: Family Medicine

## 2018-03-20 ENCOUNTER — Other Ambulatory Visit: Payer: Self-pay | Admitting: Family Medicine

## 2018-03-20 DIAGNOSIS — I6002 Nontraumatic subarachnoid hemorrhage from left carotid siphon and bifurcation: Secondary | ICD-10-CM

## 2018-03-25 ENCOUNTER — Encounter: Payer: Self-pay | Admitting: Family Medicine

## 2018-03-25 NOTE — Telephone Encounter (Signed)
They do carotid ultrasounds at [facility] as outpatient but I'm not sure if they will be less cost effective for the patient.

## 2018-03-26 ENCOUNTER — Encounter: Payer: Self-pay | Admitting: Family Medicine

## 2018-04-02 ENCOUNTER — Encounter: Payer: Self-pay | Admitting: Family Medicine

## 2018-04-02 MED ORDER — DULAGLUTIDE 1.5 MG/0.5ML ~~LOC~~ SOAJ: 1.5000 mg | SUBCUTANEOUS | 3 refills | Status: DC

## 2018-04-06 ENCOUNTER — Other Ambulatory Visit: Payer: BC Managed Care – PPO

## 2018-04-11 ENCOUNTER — Other Ambulatory Visit: Payer: Self-pay | Admitting: Family Medicine

## 2018-04-14 ENCOUNTER — Inpatient Hospital Stay: Admission: RE | Admit: 2018-04-14 | Payer: BLUE CROSS/BLUE SHIELD | Source: Ambulatory Visit

## 2018-04-14 ENCOUNTER — Ambulatory Visit
Admission: RE | Admit: 2018-04-14 | Discharge: 2018-04-14 | Disposition: A | Discharge status: Home / Self Care | Payer: BLUE CROSS/BLUE SHIELD | Source: Ambulatory Visit | Attending: Family Medicine | Admitting: Family Medicine

## 2018-04-14 DIAGNOSIS — I6002 Nontraumatic subarachnoid hemorrhage from left carotid siphon and bifurcation: Secondary | ICD-10-CM

## 2018-04-15 ENCOUNTER — Encounter: Payer: Self-pay | Admitting: Family Medicine

## 2018-04-16 ENCOUNTER — Encounter: Payer: Self-pay | Admitting: Family Medicine

## 2018-04-16 ENCOUNTER — Ambulatory Visit
Admission: RE | Admit: 2018-04-16 | Discharge: 2018-04-16 | Disposition: A | Discharge status: Home / Self Care | Payer: BLUE CROSS/BLUE SHIELD | Source: Ambulatory Visit | Attending: Family Medicine | Admitting: Family Medicine

## 2018-04-16 DIAGNOSIS — Z1239 Encounter for other screening for malignant neoplasm of breast: Secondary | ICD-10-CM

## 2018-04-17 ENCOUNTER — Encounter: Payer: Self-pay | Admitting: Family Medicine

## 2018-04-18 ENCOUNTER — Encounter: Payer: Self-pay | Admitting: Family Medicine

## 2018-04-19 ENCOUNTER — Other Ambulatory Visit: Payer: Self-pay | Admitting: Family Medicine

## 2018-05-02 ENCOUNTER — Other Ambulatory Visit: Payer: Self-pay | Admitting: Family Medicine

## 2018-05-04 MED ORDER — DULAGLUTIDE 1.5 MG/0.5ML ~~LOC~~ SOAJ: 1.5000 mg | SUBCUTANEOUS | 3 refills | Status: DC

## 2018-05-04 NOTE — Telephone Encounter (Signed)
Ok to refill??  Last office visit 03/13/2018.  Last refill 12/29/2017.

## 2018-05-05 ENCOUNTER — Encounter: Payer: Self-pay | Admitting: Family Medicine

## 2018-05-10 ENCOUNTER — Encounter: Payer: Self-pay | Admitting: Family Medicine

## 2018-05-11 ENCOUNTER — Telehealth: Payer: Self-pay | Admitting: *Deleted

## 2018-05-11 ENCOUNTER — Encounter: Payer: Self-pay | Admitting: Family Medicine

## 2018-05-11 ENCOUNTER — Other Ambulatory Visit: Payer: Self-pay | Admitting: Family Medicine

## 2018-05-11 LAB — COLOGUARD

## 2018-05-11 MED ORDER — ATORVASTATIN CALCIUM 40 MG PO TABS: 40.0000 mg | ORAL_TABLET | Freq: Every day | ORAL | 3 refills | Status: DC

## 2018-05-11 NOTE — Telephone Encounter (Signed)
Received VO for Cologuard.   Orders placed.   Cologuard (Order [id number])

## 2018-05-15 ENCOUNTER — Other Ambulatory Visit: Payer: Self-pay | Admitting: Family Medicine

## 2018-05-15 MED ORDER — ALPRAZOLAM 0.5 MG PO TABS: ORAL_TABLET | ORAL | 2 refills | Status: DC

## 2018-05-15 MED ORDER — DICLOFENAC SODIUM 75 MG PO TBEC: 75.0000 mg | DELAYED_RELEASE_TABLET | Freq: Two times a day (BID) | ORAL | 2 refills | Status: DC

## 2018-05-15 NOTE — Telephone Encounter (Signed)
Pt is requesting refill on Xanax   LOV: 03/13/18 LRF:   10/13/17

## 2018-05-30 ENCOUNTER — Other Ambulatory Visit: Payer: Self-pay | Admitting: Family Medicine

## 2018-06-04 ENCOUNTER — Encounter: Payer: Self-pay | Admitting: Family Medicine

## 2018-06-10 ENCOUNTER — Encounter: Payer: Self-pay | Admitting: Family Medicine

## 2018-06-12 ENCOUNTER — Other Ambulatory Visit: Payer: Self-pay | Admitting: Family Medicine

## 2018-06-12 ENCOUNTER — Encounter: Payer: Self-pay | Admitting: Gastroenterology

## 2018-06-16 ENCOUNTER — Encounter: Payer: Self-pay | Admitting: Gastroenterology

## 2018-06-22 ENCOUNTER — Encounter: Payer: Self-pay | Admitting: Gastroenterology

## 2018-06-22 ENCOUNTER — Encounter: Payer: Self-pay | Admitting: Family Medicine

## 2018-07-04 ENCOUNTER — Encounter: Payer: Self-pay | Admitting: Family Medicine

## 2018-07-06 MED ORDER — TRAMADOL HCL 50 MG PO TABS: 50.0000 mg | ORAL_TABLET | Freq: Three times a day (TID) | ORAL | 0 refills | Status: DC | PRN

## 2018-07-06 NOTE — Telephone Encounter (Signed)
Requesting refill Tramadol       LOV: 03/13/18  LRF:  05/04/18

## 2018-07-20 ENCOUNTER — Other Ambulatory Visit: Payer: Self-pay

## 2018-07-20 ENCOUNTER — Ambulatory Visit (AMBULATORY_SURGERY_CENTER): Payer: Self-pay | Admitting: *Deleted

## 2018-07-20 VITALS — Ht 61.0 in | Wt 143.0 lb

## 2018-07-20 DIAGNOSIS — Z800 Family history of malignant neoplasm of digestive organs: Secondary | ICD-10-CM

## 2018-07-20 DIAGNOSIS — Z1211 Encounter for screening for malignant neoplasm of colon: Secondary | ICD-10-CM

## 2018-07-20 MED ORDER — SUPREP BOWEL PREP KIT 17.5-3.13-1.6 GM/177ML PO SOLN: 1.0000 | Freq: Once | ORAL | 0 refills | Status: AC

## 2018-07-20 NOTE — Progress Notes (Signed)
No egg or soy allergy known to patient  No issues with past sedation with any surgeries  or procedures, no intubation problems  No diet pills per patient No home 02 use per patient  No blood thinners per patient  Pt denies issues with constipation  No A fib or A flutter  EMMI video offered and declined by the patient. suprep sample given # [id number], exp 7/21

## 2018-07-28 ENCOUNTER — Other Ambulatory Visit: Payer: Self-pay | Admitting: Family Medicine

## 2018-07-30 ENCOUNTER — Encounter: Payer: BLUE CROSS/BLUE SHIELD | Admitting: Gastroenterology

## 2018-08-03 ENCOUNTER — Encounter: Payer: Self-pay | Admitting: Gastroenterology

## 2018-08-17 ENCOUNTER — Encounter: Payer: Self-pay | Admitting: Gastroenterology

## 2018-08-17 ENCOUNTER — Encounter: Payer: Self-pay | Admitting: Family Medicine

## 2018-08-17 ENCOUNTER — Ambulatory Visit (AMBULATORY_SURGERY_CENTER): Payer: BC Managed Care – PPO | Admitting: Gastroenterology

## 2018-08-17 VITALS — BP 145/80 | HR 80 | Temp 97.5°F | Resp 20 | Ht 61.0 in | Wt 143.0 lb

## 2018-08-17 DIAGNOSIS — K5140 Inflammatory polyps of colon without complications: Secondary | ICD-10-CM

## 2018-08-17 DIAGNOSIS — K635 Polyp of colon: Secondary | ICD-10-CM | POA: Diagnosis present

## 2018-08-17 DIAGNOSIS — R195 Other fecal abnormalities: Secondary | ICD-10-CM

## 2018-08-17 DIAGNOSIS — D125 Benign neoplasm of sigmoid colon: Secondary | ICD-10-CM

## 2018-08-17 DIAGNOSIS — D124 Benign neoplasm of descending colon: Secondary | ICD-10-CM

## 2018-08-17 MED ORDER — SODIUM CHLORIDE 0.9 % IV SOLN: 500.0000 mL | Freq: Once | INTRAVENOUS | Status: DC

## 2018-08-17 NOTE — Progress Notes (Signed)
Called to room to assist during endoscopic procedure.  Patient ID and intended procedure confirmed with present staff. Received instructions for my participation in the procedure from the performing physician.  

## 2018-08-17 NOTE — Progress Notes (Signed)
Report given to PACU, vss 

## 2018-08-17 NOTE — Op Note (Signed)
[facility] Patient Name: Tracy Price Procedure Date: 08/17/2018 1:58 PM MRN: [id number] Endoscopist: Remo Lipps P. Havery Moros , MD Age: 62 Referring MD:  Date of Birth: 05/15/1956 Gender: Female Account #: [id number] Procedure:                Colonoscopy Indications:              Positive Cologuard test, father with colon cancer                            diagnosed age 36s Medicines:                Monitored Anesthesia Care Procedure:                Pre-Anesthesia Assessment:                           - Prior to the procedure, a History and Physical                            was performed, and patient medications and                            allergies were reviewed. The patient's tolerance of                            previous anesthesia was also reviewed. The risks                            and benefits of the procedure and the sedation                            options and risks were discussed with the patient.                            All questions were answered, and informed consent                            was obtained. Prior Anticoagulants: The patient has                            taken no previous anticoagulant or antiplatelet                            agents. ASA Grade Assessment: III - A patient with                            severe systemic disease. After reviewing the risks                            and benefits, the patient was deemed in                            satisfactory condition to undergo the procedure.  After obtaining informed consent, the colonoscope                            was passed under direct vision. Throughout the                            procedure, the patient's blood pressure, pulse, and                            oxygen saturations were monitored continuously. The                            Model PCF-H190DL [id number]) scope was introduced                            through the anus and advanced  to the the terminal                            ileum, with identification of the appendiceal                            orifice and IC valve. The colonoscopy was performed                            without difficulty. The patient tolerated the                            procedure well. The quality of the bowel                            preparation was adequate. The ileocecal valve,                            appendiceal orifice, and rectum were photographed. Scope In: 2:00:09 PM Scope Out: 2:24:30 PM Scope Withdrawal Time: 0 hours 18 minutes 59 seconds  Total Procedure Duration: 0 hours 24 minutes 21 seconds  Findings:                 The perianal and digital rectal examinations were                            normal.                           The terminal ileum appeared normal.                           A 3 mm polyp was found in the descending colon. The                            polyp was sessile. The polyp was removed with a                            cold snare. Resection and retrieval were complete.  Anal papilla(e) were hypertrophied.                           The bowel prep was fair on insertion. Several                            minutes spent lavaging the colon to achieve                            adequate views. The exam was otherwise without                            abnormality. Complications:            No immediate complications. Estimated blood loss:                            Minimal. Estimated Blood Loss:     Estimated blood loss was minimal. Impression:               - The examined portion of the ileum was normal.                           - One 3 mm polyp in the descending colon, removed                            with a cold snare. Resected and retrieved.                           - Anal papilla(e) were hypertrophied.                           - The examination was otherwise normal. Recommendation:           - Patient has a contact  number available for                            emergencies. The signs and symptoms of potential                            delayed complications were discussed with the                            patient. Return to normal activities tomorrow.                            Written discharge instructions were provided to the                            patient.                           - Resume previous diet.                           - Continue present medications.                           -  Await pathology results. Remo Lipps P. Anel Purohit, MD 08/17/2018 2:27:26 PM This report has been signed electronically.

## 2018-08-17 NOTE — Patient Instructions (Signed)
   Information on polyps given to you today   Await pathology results on polyp removed today   YOU HAD AN ENDOSCOPIC PROCEDURE TODAY AT [facility]:   Refer to the procedure report that was given to you for any specific questions about what was found during the examination.  If the procedure report does not answer your questions, please call your gastroenterologist to clarify.  If you requested that your care partner not be given the details of your procedure findings, then the procedure report has been included in a sealed envelope for you to review at your convenience later.  YOU SHOULD EXPECT: Some feelings of bloating in the abdomen. Passage of more gas than usual.  Walking can help get rid of the air that was put into your GI tract during the procedure and reduce the bloating. If you had a lower endoscopy (such as a colonoscopy or flexible sigmoidoscopy) you may notice spotting of blood in your stool or on the toilet paper. If you underwent a bowel prep for your procedure, you may not have a normal bowel movement for a few days.  Please Note:  You might notice some irritation and congestion in your nose or some drainage.  This is from the oxygen used during your procedure.  There is no need for concern and it should clear up in a day or so.  SYMPTOMS TO REPORT IMMEDIATELY:   Following lower endoscopy (colonoscopy or flexible sigmoidoscopy):  Excessive amounts of blood in the stool  Significant tenderness or worsening of abdominal pains  Swelling of the abdomen that is new, acute  Fever of 100F or higher    For urgent or emergent issues, a gastroenterologist can be reached at any hour by calling [phone].   DIET:  We do recommend a small meal at first, but then you may proceed to your regular diet.  Drink plenty of fluids but you should avoid alcoholic beverages for 24 hours.  ACTIVITY:  You should plan to take it easy for the rest of today and you should NOT  DRIVE or use heavy machinery until tomorrow (because of the sedation medicines used during the test).    FOLLOW UP: Our staff will call the number listed on your records the next business day following your procedure to check on you and address any questions or concerns that you may have regarding the information given to you following your procedure. If we do not reach you, we will leave a message.  However, if you are feeling well and you are not experiencing any problems, there is no need to return our call.  We will assume that you have returned to your regular daily activities without incident.  If any biopsies were taken you will be contacted by phone or by letter within the next 1-3 weeks.  Please call us at [phone] if you have not heard about the biopsies in 3 weeks.    SIGNATURES/CONFIDENTIALITY: You and/or your care partner have signed paperwork which will be entered into your electronic medical record.  These signatures attest to the fact that that the information above on your After Visit Summary has been reviewed and is understood.  Full responsibility of the confidentiality of this discharge information lies with you and/or your care-partner.

## 2018-08-18 ENCOUNTER — Telehealth: Payer: Self-pay

## 2018-08-18 ENCOUNTER — Telehealth: Payer: Self-pay | Admitting: *Deleted

## 2018-08-18 MED ORDER — ATORVASTATIN CALCIUM 40 MG PO TABS: 40.0000 mg | ORAL_TABLET | Freq: Every day | ORAL | 5 refills | Status: DC

## 2018-08-18 NOTE — Telephone Encounter (Signed)
  Follow up Call-  Call back number 08/17/2018  Post procedure Call Back phone  # [phone]  Permission to leave phone message Yes  Some recent data might be hidden     Patient questions:  Do you have a fever, pain , or abdominal swelling? No. Pain Score  0 *  Have you tolerated food without any problems? Yes.    Have you been able to return to your normal activities? Yes.    Do you have any questions about your discharge instructions: Diet   No. Medications  No. Follow up visit  No.  Do you have questions or concerns about your Care? No.  Actions: * If pain score is 4 or above: No action needed, pain <4.  Information provided via brother.

## 2018-08-18 NOTE — Telephone Encounter (Signed)
First post procedure follow up call, no answer 

## 2018-09-09 ENCOUNTER — Other Ambulatory Visit: Payer: Self-pay | Admitting: Family Medicine

## 2018-09-09 NOTE — Telephone Encounter (Signed)
Requesting refill    Tramadol  LOV: 03/13/18  LRF:  07/06/18

## 2018-09-14 ENCOUNTER — Ambulatory Visit: Payer: BC Managed Care – PPO | Admitting: Family Medicine

## 2018-09-14 ENCOUNTER — Encounter: Payer: Self-pay | Admitting: Family Medicine

## 2018-09-14 VITALS — BP 140/72 | HR 80 | Temp 97.8°F | Resp 16 | Ht 61.0 in | Wt 135.0 lb

## 2018-09-14 DIAGNOSIS — E119 Type 2 diabetes mellitus without complications: Secondary | ICD-10-CM

## 2018-09-14 DIAGNOSIS — N183 Chronic kidney disease, stage 3 unspecified: Secondary | ICD-10-CM

## 2018-09-14 DIAGNOSIS — I10 Essential (primary) hypertension: Secondary | ICD-10-CM | POA: Diagnosis not present

## 2018-09-14 DIAGNOSIS — R5382 Chronic fatigue, unspecified: Secondary | ICD-10-CM | POA: Diagnosis not present

## 2018-09-14 NOTE — Progress Notes (Signed)
Subjective:    Patient ID: Tracy Price, female    DOB: [birth year], 62 y.o.   MRN: [id number]  Medication Refill    Unfortunately, patient was recently admitted to the hospital with an acute on chronic subdural hematoma. I have copied relevant portions of the discharge summary and included them below for my reference:  Admit date: 03/10/2017 Discharge date: 03/20/2017  Discharge Diagnoses:  Principal Problem:   Subdural hemorrhage following injury [facility]) Active Problems:   Acute lower UTI   Hypoalbuminemia due to protein-calorie malnutrition ([place])   Chronic right shoulder pain   Diabetes mellitus type 2 in obese [facility])  Brief HPI:   Tracy Price a 62 y.o.right handed femalewith history of CKD, T2DM, HTN, fall with 2 weeks history of intermittent severe headaches, with confusion, slurred speech and unsteady gait.  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 She underwent left-sided burr hole craniectomy for evacuation of SDH by Dr. Saintclair Halsted and was treated with Decadron and on Keppra for seizure prophylaxis. Patient with resultant ataxic gait, expressive deficits, left visual field deficits and right sided weakness affecting mobility as well as ability to carry out ADL tasks. CIR recommended for follow up therapy.     Hospital Course: Tracy Price was admitted to rehab 03/10/2017 for inpatient therapies to consist of PT, ST and OT at least three hours five days a week. Past admission physiatrist, therapy team and rehab RN have worked together to provide customized collaborative inpatient rehab. She was maintained on Keppra and has been seizure free. Blood pressure were monitored on bid basis and have been controlled on Cozaar. Diabetes has been monitored on ac/hs basis with Trajenta on board and BS have been controlled. Po intake has been good and mood has been stable. She was encouraged to continue Zoloft for now and  discuss taper with MD on follow up.    Right shoulder pain has been managed with use of voltaren and she was advise to avoid ultram to prevent decrease in seizure threshold. She was also advised to use ice 2-3 times a day for pain control. She was found to have acute UTI and was treated with 7 day course of macrodantin. Acute on chronic renal failure has resolved and  SCr is at baseline. Left crani incision has healed well without signs or symptoms of infection and staples were removed without difficulty.  She has made steady progress during her rehab stay and is modified independent at discharge. She will continue to receive outpatient PT and OT at [place] after discharge.    Rehab course: During patient's stay in rehab weekly team conferences were held to monitor patient's progress, set goals and discuss barriers to discharge. At admission, patient required min assist with mobility and basic self care tasks. She exhibited mild higher level word finding deficits and difficulty with writing due to problems with spelling. She has had improvement in activity tolerance, balance, postural control, as well as ability to compensate for deficits. She is able to complete ADL tasks at modified independent level and requires distant supervision with IADLs.  She is modified independent for transfers and is able to ambulate household distance without AD. She requires rollater to ambulate in community setting. Written expression and word finding deficits have resolved and speech therapy signed off by 6/8.    03/27/17 She is here today for follow-up.  When she was admitted to the hospital, her initial creatinine  was 1.8.  Her last creatinine was tested on 611 was found to be 1.57.  Hemoglobin at discharge was 12.5.  No one has assessed her diabetic control with a hemoglobin A1c in  a year.  Her last cholesterol was checked in July 2017 and her LDL cholesterol at that time was 100. Previously it had been as  high as 162. In the hospital, they discontinued Glyxambi and lipitor.  I'm not sure why the Lipitor was stopped. I assume that the other medication was discontinued because of her acute on chronic renal insufficiency.  Since discharge from the hospital, the patient has done remarkably well. She is ambulating today without assistance. She is able to answer all questions appropriately. She has very minimal word finding difficulties. She is here today with her brother. He does report that she has some mild short-term memory loss. She does have a constant daily headache. She treats this with a combination of old tear and and Tylenol. She also has pain in her shoulder. She is requesting to resume the tramadol but I explained again that this could increase her chance of having a seizure and therefore she agreed not to use it. She is not receiving any physical therapy. She states that she doesn't feel that she needs it. She is exercising on her iron on a daily basis. She denies any seizure activity. She does report fatigue and dizziness on a daily basis.  At that time, my plan was: From a standpoint of driving, strenuous activity etc. I have asked the patient to refrain from this for at least the next 6 weeks. I will reassess the patient in 2 weeks to see how she is progressing. I will check a Keppra level to ensure that she is not becoming toxic given the fact she is reporting some dizziness as well as sedation. I doubt that she is.  If hemoglobin A1c is extremely high, the patient may need to begin additional hyperglycemic agents. Therefore I will need to assess her renal function to see what options exist for her. I would want to avoid Actos because of possible swelling. I would want to avoid metformin due to renal insufficiency. I would want to avoid glipizide because of the risk of hypoglycemia. Therefore she may be a candidate for victoza. Her BP is slightly elevated.  Reassess in 2 weeks.  Will avoid more meds at  this time to reduce the chance of hypotension, dizziness, and fatigue.    07/25/17 At that time, the patient's hemoglobin A1c was 7.2. She was continued on the combination of glipizide and Tradjenta.  She is overdue for hemoglobin A1c along with fasting lab work. Her direct LDL at that time was greater than 150 and therefore I recommended Lipitor 20 mg a day. However she complains of myalgias on Lipitor. She states that she has never been able to tolerate a statin. All the statins cause her muscles to hurtl to make her feel weak.  She has stopped the statin in the past and the symptoms improved. . She is compliant with the Lipitor but she is interested in making a change. She is also experiencing episodes of hypoglycemia when she takes glipizide. Occasionally she will take it without a meal and will cause her sugar to drop. She is not checking hers but she will feel extremely weak and dizzy. She will feel shaky.  Symptoms improved when she eats some food. This occurs a few days a week. She denies any polyuria, polydipsia, or blurry  vision.  She denies any chest pain dyspnea on exertion or shortness of breath. She also tells me that the injection I gave her her shoulder did not help. She is also questioning if I can give her a shot in her lower back/posterior hipto help with pain radiating from her back down her leg. I explained to the patient that she really needs to see her orthopedist, Dr. Dossie Der, given the fact that my initial cortisone injection did not help and I am unable to perform epidural steroid injections or hip joint injections.  At that time, my plan was: Check hemoglobin A1c. Goal hemoglobin A1c is less than 6.5. Consider switching glipizide to actos.  Patient is unable to afford name brand medication. However I believe she will be able to tolerate the ACTOS WITHOUT HYPOGLYCEMIa.  I will also check a fasting lipid panel. Goal LDL cholesterol is less than 100. She is unable to tolerate a statin. I  recommended decreasing Lipitor 20 mg once a week.  If she is able to tolerate this over several weeks, I would increase to twice a week. I will continue this until we are able to achieve the highest tolerated therapeutic dose. I recommended that she call her orthopedist to discuss a repeat shoulder injection versus an MRI as well as to discuss any back injection  03/13/18 Patient's last hemoglobin A1c was excellent in October.  At that time she discontinue glipizide due too early morning hypoglycemia.  She was only on Januvia alone for her diabetes control.  However she states that she would frequently see sugars in the 300s and therefore she resumed glipizide on her own.  On the combination of glipizide and Januvia, she states that her blood sugars are in the low 100s.  However she does occasionally have hypoglycemic episodes.  Her biggest concern is her inability to lose weight since resuming glipizide.  Past medical history is complicated by chronic kidney disease making metformin potentially dangerous.  She is also been adverse to name brand medications due to cost.  Her blood pressure today is borderline at 140/80.  She denies any chest pain shortness of breath or dyspnea on exertion.  She denies any myalgias or right upper quadrant pain.  At that time, my plan was: Diabetic foot exam was performed today and was normal.  Recommended annual diabetic eye exam but she states her insurance will not pay for that and she cannot afford it.  Recommended mammogram which is overdue, Pap smear which is overdue, and colonoscopy.  She agrees to allow me to schedule a mammogram.  She agrees to see GI regarding a colonoscopy.  She declines a Pap smear at the current time.  She will allow me to screen her for hepatitis C.  Her blood pressure is acceptable.  I will check a fasting lipid panel.  Goal LDL cholesterol is less than 100.  I will also check a hemoglobin A1c.  If reasonably controlled, I would recommend  discontinuing glipizide and replacing with Victoza to try to facilitate weight loss.  Otherwise preventative care is up-to-date.  09/14/18 At that time, HgA1c was 7.6 and I recommended switching glipizide to trulicity.  Cologuard was positive but follow up colonoscpoy only found a colon polyp.  Since starting the Trulicity, the patient has lost almost 50 pounds!!!.  She denies any concerning symptoms that would make me think this weight loss is something more nefarious.  She denies any fevers or chills.  She denies any abdominal pain.  She  denies any melena or hematochezia.  She does report some fatigue and would like to have her vitamin B12 level checked.  She denies any hypoglycemia.  She denies any polyuria, polydipsia, or blurry vision.  Her blood pressure today is borderline however she has a history of whitecoat syndrome she states that her blood pressure at home has been well controlled.  She is compliant with her Januvia and her Trulicity.  She is also taking Lipitor 40 mg a day.  She does have some low back pain and other myalgias which she attributes to the Lipitor but she is able to tolerate it.  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  We are focusing on medical management at the present time.  She is taking an aspirin.  She is interested in possibly starting the Cipro if her LDL cholesterol still well above goal to help prevent strokes and heart attacks.  Carotid artery ultrasounds revealed no extracranial carotid artery stenosis.  She denies any symptoms of stroke or TIA.  She denies any chest pain shortness of breath or dyspnea on exertion   Past Medical History:  Diagnosis Date  . Allergy   . Chest pain   . CKD (chronic kidney disease) stage 3, GFR 30-59 ml/min (HCC)   . Diabetes mellitus without complication ([place])    type 2  . Hyperlipidemia    . Hypertension   . Sleep apnea 1995   had surgery to correct   Past Surgical History:  Procedure Laterality Date  . CRANIOTOMY Left 03/05/2017   Procedure: LEFT BURR HOLE  HEMATOMA EVACUATION SUBDURAL;  Surgeon: Kary Kos, MD;  Location: [facility];  Service: Neurosurgery;  Laterality: Left;  Marland Kitchen GASTRIC BYPASS  2005  . orif arm    . TONSILLECTOMY     Current Outpatient Medications on File Prior to Visit  Medication Sig Dispense Refill  . acetaminophen (TYLENOL) 325 MG tablet Take 1-2 tablets (325-650 mg total) by mouth every 4 (four) hours as needed for mild pain.    Marland Kitchen ALPRAZolam (XANAX) 0.5 MG tablet TAKE 1 TABLET BY MOUTH AT NIGHT FOR ANXIETY 60 tablet 2  . amLODipine (NORVASC) 5 MG tablet TAKE 1 TABLET BY MOUTH EVERY DAY 90 tablet 3  . aspirin 81 MG chewable tablet Chew by mouth daily.    Marland Kitchen atorvastatin (LIPITOR) 40 MG tablet Take 1 tablet (40 mg total) by mouth daily. 30 tablet 5  . diclofenac (VOLTAREN) 75 MG EC tablet TAKE 1 TABLET BY MOUTH TWICE A DAY 180 tablet 0  . Dulaglutide (TRULICITY) 1.5 SK/8.7GO SOPN Inject 1.5 mg into the skin once a week. 4 pen 3  . glipiZIDE (GLUCOTROL XL) 5 MG 24 hr tablet TAKE 1 TABLET BY MOUTH EVERY DAY WITH BREAKFAST (Patient not taking: Reported on 07/20/2018) 90 tablet 1  . Insulin Pen Needle 31G X 5 MM MISC Use daily with Trulicity 115 each 3  . JANUVIA 100 MG tablet TAKE 1 TABLET BY MOUTH DAILY**DISCONTINUE TRADJENTA 90 tablet 2  . losartan (COZAAR) 100 MG tablet TAKE 1 TABLET BY MOUTH EVERY DAY 90 tablet 1  . Melatonin 5 MG TABS Take by mouth.    . traMADol (ULTRAM) 50 MG tablet TAKE 1 TABLET BY MOUTH THREE TIMES A DAY AS NEEDED 90 tablet 1   No current facility-administered medications on file prior to visit.  Allergies  Allergen Reactions  . Crestor [Rosuvastatin Calcium] Other (See Comments)    Mylgia  . Lipitor [Atorvastatin] Other (See Comments)    Mylagia   Social History   Socioeconomic History  . Marital status: Single     Spouse name: Not on file  . Number of children: Not on file  . Years of education: Not on file  . Highest education level: Not on file  Occupational History  . Not on file  Social Needs  . Financial resource strain: Not on file  . Food insecurity:    Worry: Not on file    Inability: Not on file  . Transportation needs:    Medical: Not on file    Non-medical: Not on file  Tobacco Use  . Smoking status: Never Smoker  . Smokeless tobacco: Never Used  Substance and Sexual Activity  . Alcohol use: No  . Drug use: No  . Sexual activity: Not on file  Lifestyle  . Physical activity:    Days per week: Not on file    Minutes per session: Not on file  . Stress: Not on file  Relationships  . Social connections:    Talks on phone: Not on file    Gets together: Not on file    Attends religious service: Not on file    Active member of club or organization: Not on file    Attends meetings of clubs or organizations: Not on file    Relationship status: Not on file  . Intimate partner violence:    Fear of current or ex partner: Not on file    Emotionally abused: Not on file    Physically abused: Not on file    Forced sexual activity: Not on file  Other Topics Concern  . Not on file  Social History Narrative  . Not on file       Review of Systems  All other systems reviewed and are negative.      Objective:   Physical Exam  Constitutional: She is oriented to person, place, and time. She appears well-developed and well-nourished. No distress.  Cardiovascular: Normal rate, regular rhythm and normal heart sounds.  No murmur heard. Pulmonary/Chest: Effort normal and breath sounds normal. No respiratory distress. She has no wheezes. She has no rales.  Abdominal: Soft. Bowel sounds are normal. She exhibits no distension. There is no tenderness. There is no rebound and no guarding.  Musculoskeletal: She exhibits no edema.  Neurological: She is alert and oriented to person, place,  and time. No cranial nerve deficit. She exhibits normal muscle tone.  Skin: She is not diaphoretic.  Psychiatric: She has a normal mood and affect. Her behavior is normal. Judgment and thought content normal.  Vitals reviewed.         Assessment & Plan:  Type 2 diabetes mellitus without complication, without long-term current use of insulin (HCC) - Plan: Hemoglobin A1c, CBC with Differential/Platelet, COMPLETE METABOLIC PANEL WITH GFR, Lipid panel, Microalbumin, urine  Benign essential HTN - Plan: Hemoglobin A1c, CBC with Differential/Platelet, COMPLETE METABOLIC PANEL WITH GFR, Lipid panel, Microalbumin, urine  Stage 3 chronic kidney disease ([place]) - Plan: Hemoglobin A1c, CBC with Differential/Platelet, COMPLETE METABOLIC PANEL WITH GFR, Lipid panel, Microalbumin, urine  Chronic fatigue - Plan: Vitamin B12  Given her fatigue and her request I will check a vitamin B12 level.  I will also monitor hemoglobin A1c.  Given her weight loss, if her hemoglobin A1c is less than 6.5, I would discontinue  Januvia and leave the patient only on Trulicity.  I will also check a fasting lipid panel.  Ideally I like her LDL cholesterol to be well below 70 given her history of intracranial internal carotid artery stenosis bilaterally.  If not, we could add Vascepa.  She is compliant with aspirin 81 mg a day.  I will also monitor a CBC, CMP, and urine microalbumin.  If her urine microalbumin is elevated and her glomerular filtration rate is above 30, we may consider adding low-dose Invokana to help prevent progression of diabetic kidney disease.  Otherwise the patient's vaccinations and cancer screening are up-to-date except for a Pap smear.  I offered this to the patient today but she declined and prefers to get this at her gynecologist

## 2018-09-15 ENCOUNTER — Ambulatory Visit: Payer: BC Managed Care – PPO | Admitting: Family Medicine

## 2018-09-15 LAB — MICROALBUMIN, URINE: Microalb, Ur: 1.5 mg/dL

## 2018-09-15 LAB — HEMOGLOBIN A1C
EAG (MMOL/L): 6.3 (calc)
Hgb A1c MFr Bld: 5.6 % of total Hgb (ref <5.7)
MEAN PLASMA GLUCOSE: 114 (calc)

## 2018-09-15 LAB — COMPLETE METABOLIC PANEL WITH GFR
AG Ratio: 1.5 (calc) (ref 1.0–2.5)
ALT: 21 U/L (ref 6–29)
AST: 26 U/L (ref 10–35)
Albumin: 4.1 g/dL (ref 3.6–5.1)
Alkaline phosphatase (APISO): 109 U/L (ref 33–130)
BUN/Creatinine Ratio: 14 (calc) (ref 6–22)
BUN: 19 mg/dL (ref 7–25)
CO2: 24 mmol/L (ref 20–32)
Calcium: 9.1 mg/dL (ref 8.6–10.4)
Chloride: 103 mmol/L (ref 98–110)
Creat: 1.34 mg/dL — ABNORMAL HIGH (ref 0.50–0.99)
GFR, Est African American: 49 mL/min/{1.73_m2} — ABNORMAL LOW (ref >=60)
GFR, Est Non African American: 43 mL/min/{1.73_m2} — ABNORMAL LOW (ref >=60)
Globulin: 2.7 g/dL (calc) (ref 1.9–3.7)
Glucose, Bld: 84 mg/dL (ref 65–99)
Potassium: 3.8 mmol/L (ref 3.5–5.3)
Sodium: 137 mmol/L (ref 135–146)
Total Bilirubin: 0.6 mg/dL (ref 0.2–1.2)
Total Protein: 6.8 g/dL (ref 6.1–8.1)

## 2018-09-15 LAB — LIPID PANEL
Cholesterol: 142 mg/dL (ref <200)
HDL: 55 mg/dL (ref >50)
LDL Cholesterol (Calc): 69 mg/dL (calc)
Non-HDL Cholesterol (Calc): 87 mg/dL (calc) (ref <130)
Total CHOL/HDL Ratio: 2.6 (calc) (ref <5.0)
Triglycerides: 92 mg/dL (ref <150)

## 2018-09-15 LAB — CBC WITH DIFFERENTIAL/PLATELET
Basophils Absolute: 49 cells/uL (ref 0–200)
Basophils Relative: 0.8 %
Eosinophils Absolute: 403 cells/uL (ref 15–500)
Eosinophils Relative: 6.6 %
HCT: 36.7 % (ref 35.0–45.0)
Hemoglobin: 12.4 g/dL (ref 11.7–15.5)
Lymphs Abs: 1568 cells/uL (ref 850–3900)
MCH: 32.6 pg (ref 27.0–33.0)
MCHC: 33.8 g/dL (ref 32.0–36.0)
MCV: 96.6 fL (ref 80.0–100.0)
MPV: 10.7 fL (ref 7.5–12.5)
Monocytes Relative: 8.2 %
Neutro Abs: 3581 cells/uL (ref 1500–7800)
Neutrophils Relative %: 58.7 %
Platelets: 245 10*3/uL (ref 140–400)
RBC: 3.8 10*6/uL (ref 3.80–5.10)
RDW: 13 % (ref 11.0–15.0)
Total Lymphocyte: 25.7 %
WBC mixed population: 500 cells/uL (ref 200–950)
WBC: 6.1 10*3/uL (ref 3.8–10.8)

## 2018-09-15 LAB — VITAMIN B12: Vitamin B-12: 209 pg/mL (ref 200–1100)

## 2018-09-18 ENCOUNTER — Ambulatory Visit: Payer: BC Managed Care – PPO | Admitting: Family Medicine

## 2018-09-22 ENCOUNTER — Encounter: Payer: Self-pay | Admitting: Family Medicine

## 2018-09-24 ENCOUNTER — Other Ambulatory Visit: Payer: Self-pay | Admitting: Family Medicine

## 2018-10-04 ENCOUNTER — Encounter: Payer: Self-pay | Admitting: Family Medicine

## 2018-10-06 MED ORDER — ONETOUCH DELICA LANCETS 33G MISC: 3 refills | Status: DC

## 2018-10-06 MED ORDER — GLUCOSE BLOOD VI STRP: ORAL_STRIP | 3 refills | Status: DC

## 2018-10-06 MED ORDER — ONETOUCH VERIO W/DEVICE KIT: PACK | 2 refills | Status: DC

## 2018-10-14 ENCOUNTER — Encounter: Payer: Self-pay | Admitting: Family Medicine

## 2018-10-14 ENCOUNTER — Other Ambulatory Visit: Payer: Self-pay | Admitting: Family Medicine

## 2018-10-15 MED ORDER — SITAGLIPTIN PHOSPHATE 100 MG PO TABS: ORAL_TABLET | ORAL | 2 refills | Status: DC

## 2018-11-06 ENCOUNTER — Other Ambulatory Visit: Payer: Self-pay | Admitting: Family Medicine

## 2018-11-09 NOTE — Telephone Encounter (Signed)
Ok to refill??  Last office visit 09/14/2018.  Last refill 09/10/2018, #1 refill.

## 2018-12-22 ENCOUNTER — Other Ambulatory Visit: Payer: Self-pay | Admitting: Family Medicine

## 2018-12-22 MED ORDER — DICLOFENAC SODIUM 75 MG PO TBEC: 75.0000 mg | DELAYED_RELEASE_TABLET | Freq: Two times a day (BID) | ORAL | 0 refills | Status: DC

## 2018-12-23 ENCOUNTER — Encounter: Payer: Self-pay | Admitting: Family Medicine

## 2018-12-24 MED ORDER — ALPRAZOLAM 0.5 MG PO TABS: ORAL_TABLET | ORAL | 2 refills | Status: DC

## 2018-12-27 IMAGING — CT CT HEAD W/O CM
3 of 4 series · 14 of 47 positions shown, 16 images · non-contrast
Comparison: None.

CLINICAL DATA: Progressive confusion over the last 2 weeks.
Abnormal gait. Difficulty finding words. Aphasia.

EXAM:
CT HEAD WITHOUT CONTRAST
TECHNIQUE: Contiguous axial images were obtained from the base of the skull
through the vertex without intravenous contrast.

[Series 4: head 2.0 h70h · axial · 0.42mm/px · z∈[-117,+11]mm · 8 of 80 slices shown, 10 images]
[im 8/80  brain]
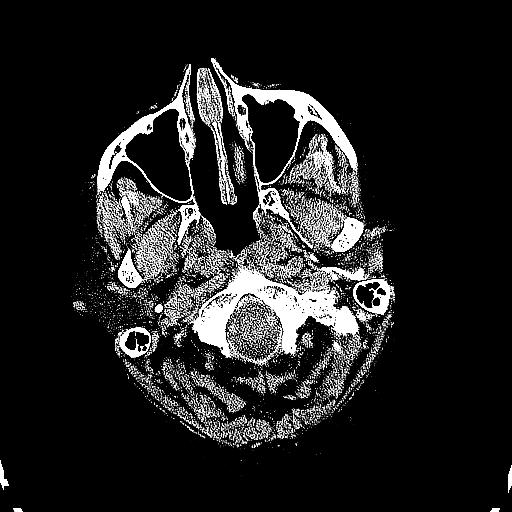
[im 8/80  bone]
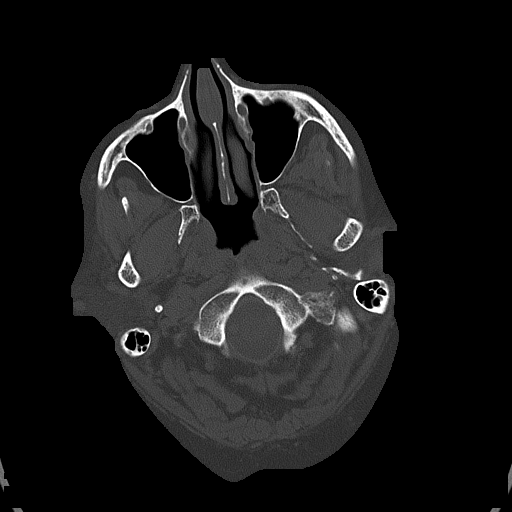
[im 16/80  brain]
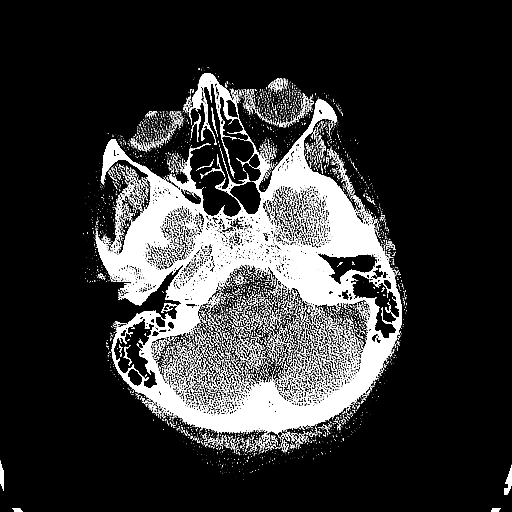
[im 24/80  brain]
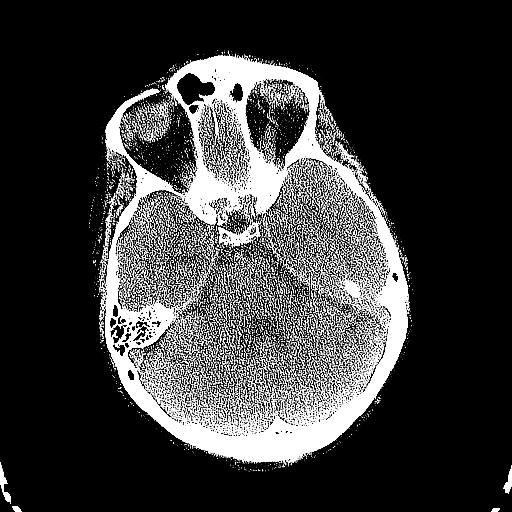
[im 36/80  brain]
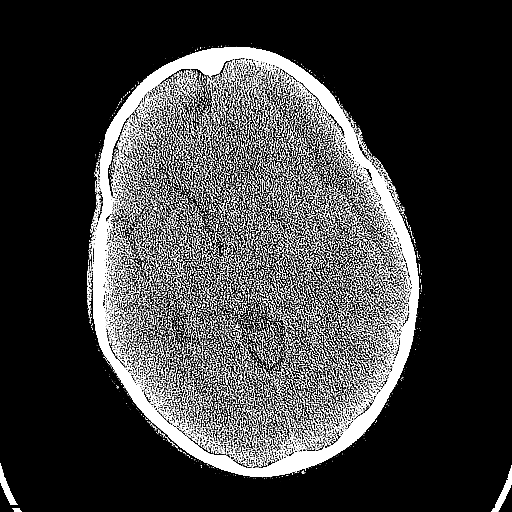
[im 44/80  brain]
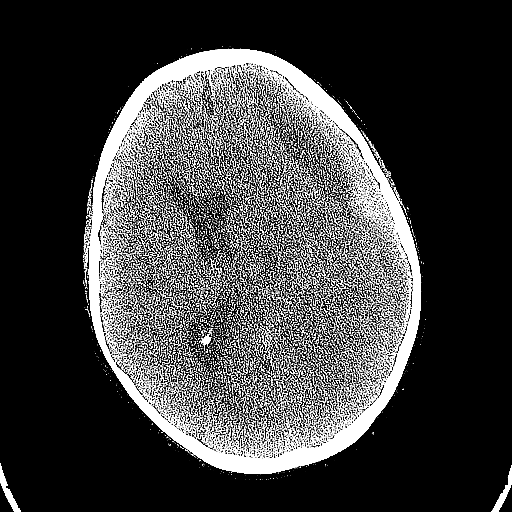
[im 44/80  bone]
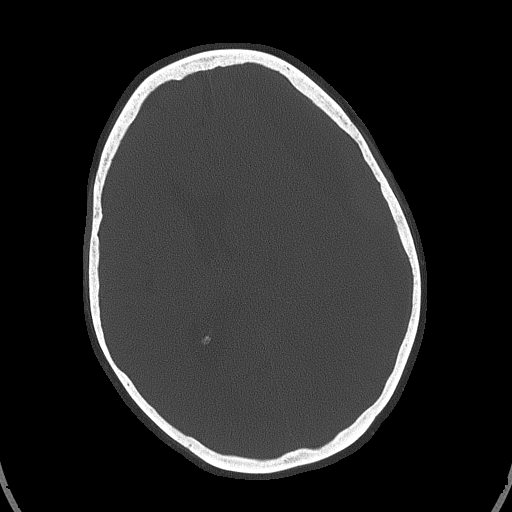
[im 56/80  brain]
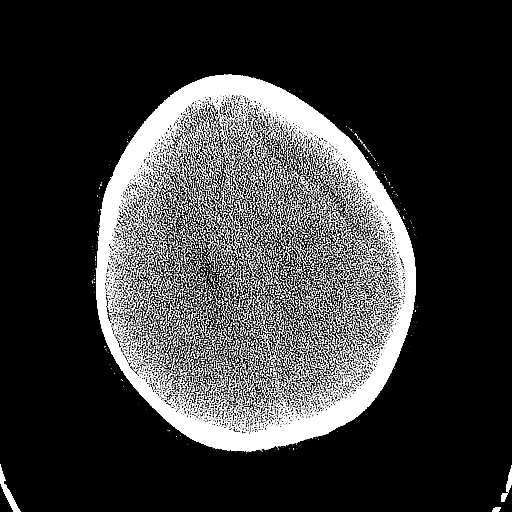
[im 64/80  brain]
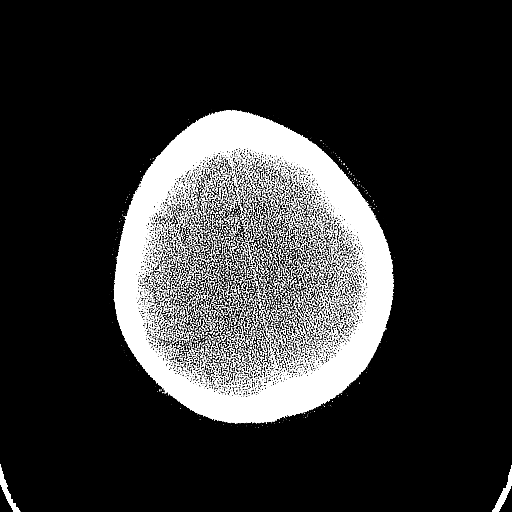
[im 72/80  brain]
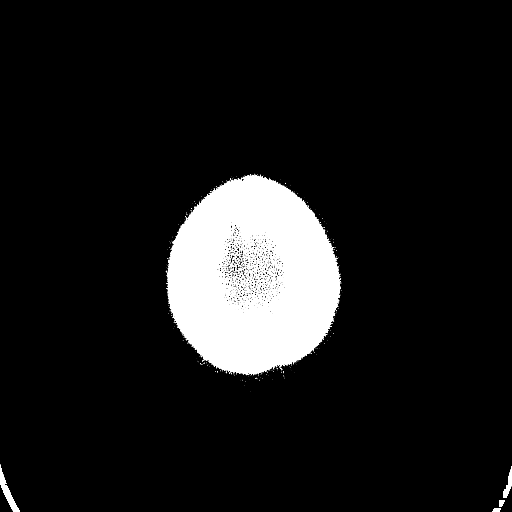

[Series 5: head 3.0 mpr cor · coronal · 0.31mm/px · 3 of 67 slices shown]
[im 23/67  brain]
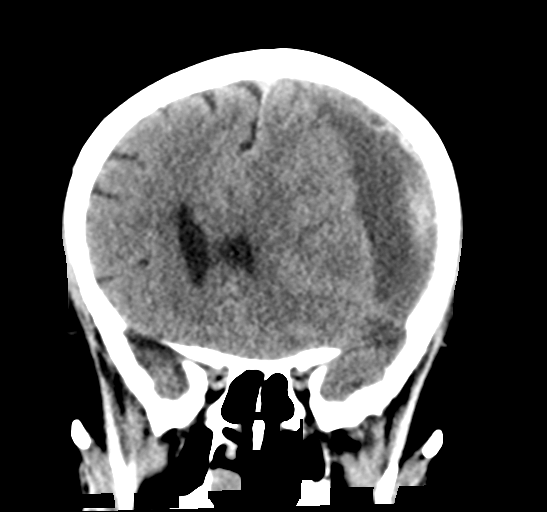
[im 30/67  brain]
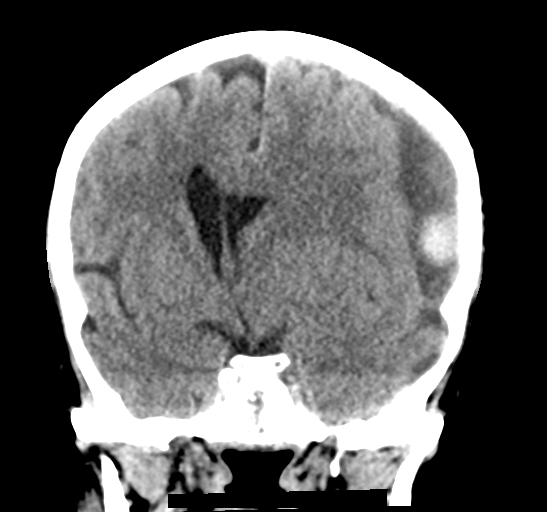
[im 37/67  brain]
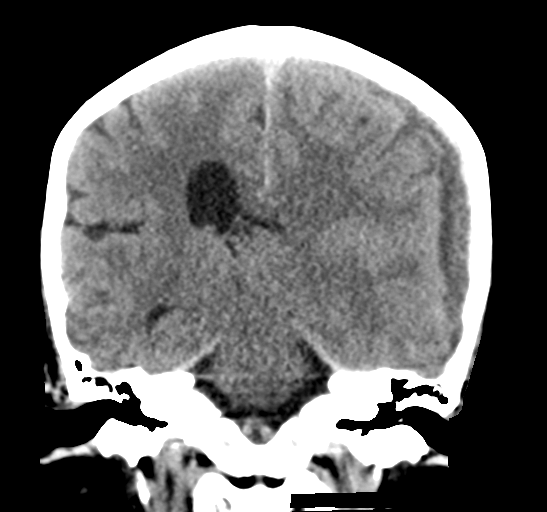

[Series 6: head 3.0 mpr sag · sagittal · 0.31mm/px · 3 of 54 slices shown]
[im 18/54  brain]
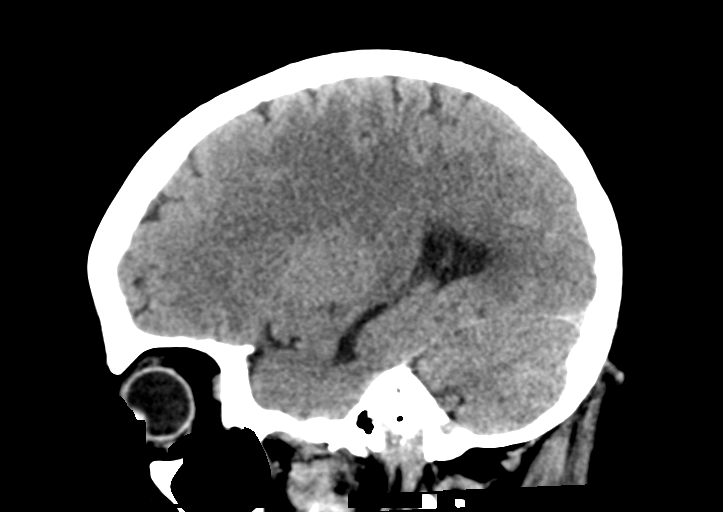
[im 27/54  brain]
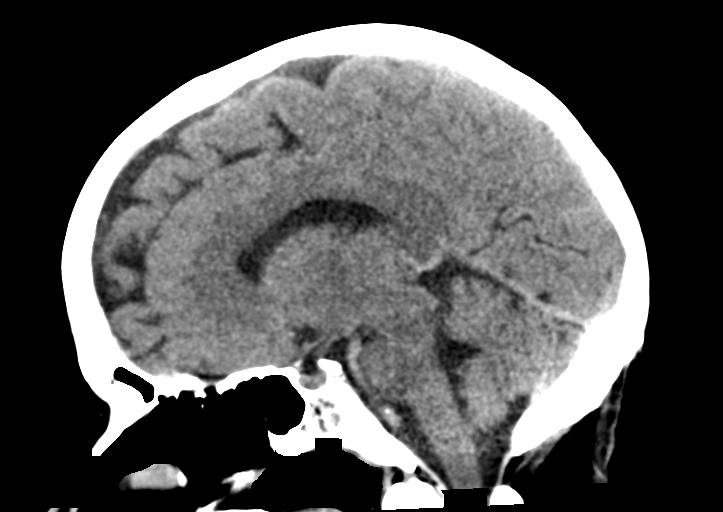
[im 36/54  brain]
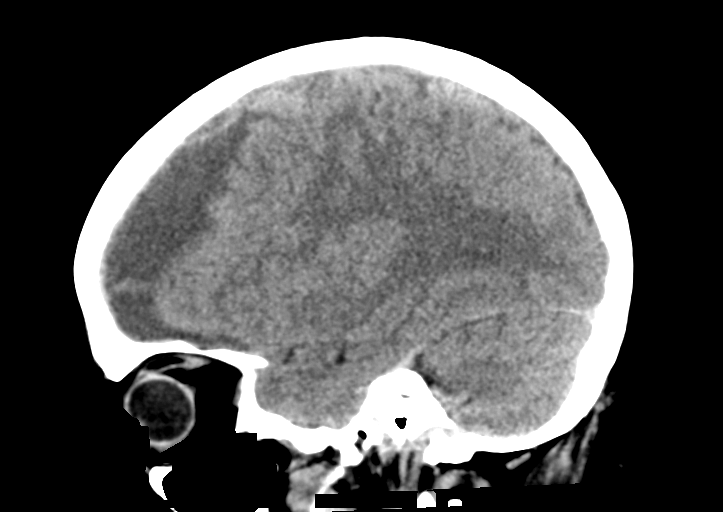

[14 of 47 positions shown; findings below may reference images not displayed]

FINDINGS: Brain: A large mixed attenuation extra-axial collection is present
over the left convexity. The deeper layers are of lower attenuation.
Superficial layers are of higher attenuation with what appears be
mixed blood products. Maximal dimension on the coronal images is
cm. There is significant midline shift measuring 12 mm at the
foramen of Kudus. There is marked effacement of the left lateral
ventricle. Impending uncal herniation is noted.

No parenchymal infarct, hemorrhage, or mass lesion is present. There
is significant sulcal effacement over the left convexity compatible
with the mass effect. The brainstem and cerebellum are normal.

Vascular: Dense calcifications are present within the cavernous
internal carotid artery's bilaterally. There is no associated
hyperdense vessel.

Skull: The calvarium is intact. No focal lytic or blastic lesions
are present.

Sinuses/Orbits: The paranasal sinuses and mastoid air cells are
clear. The globes and orbits are unremarkable.
IMPRESSION: 1. Large left-sided extra-axial mixed attenuation collection
compatible with acute and subacute subdural hematoma measuring
cm with mixed blood products.
2. Marked mass effect with effacement of the sulci over the left
convexity, effacement of the left lateral ventricle, and 12 mm of
midline shift.
3. Atherosclerosis.

These results were called by telephone at the time of interpretation
on 03/05/2017 at [DATE] to Dr. ABIMELK TIGER , who verbally
acknowledged these results.

## 2018-12-27 IMAGING — CT CT ANGIO HEAD
1 of 8 series · 6 of 47 positions shown · IV contrast (isovue)
Comparison: Head CT without contrast 2293 hours today

CLINICAL DATA: 61-year-old female with mixed density left subdural
hematoma. Progressive altered mental status and neurologic deficits
for 2 weeks.

EXAM:
CT ANGIOGRAPHY HEAD
TECHNIQUE: Multidetector CT imaging of the head was performed using the
standard protocol during bolus administration of intravenous
contrast. Multiplanar CT image reconstructions and MIPs were
obtained to evaluate the vascular anatomy.
CONTRAST:  50 mL Isovue 370

[Series 5: cow 2.0 · axial · 0.44mm/px · z∈[-167,-57]mm · 6 of 78 slices shown]
[im 12/78  brain]
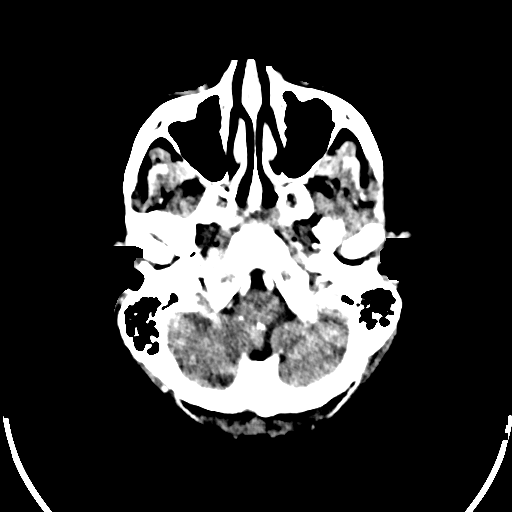
[im 23/78  bone]
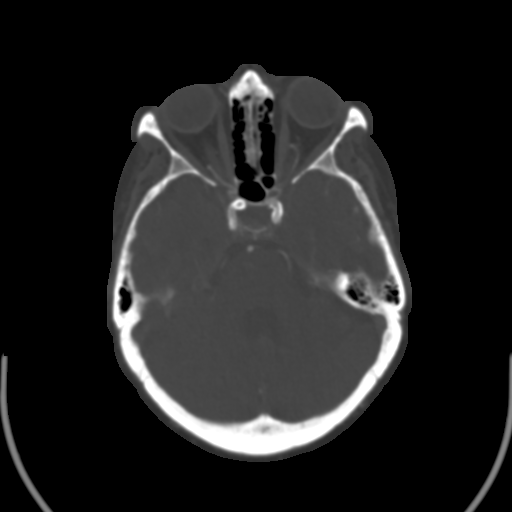
[im 34/78  brain]
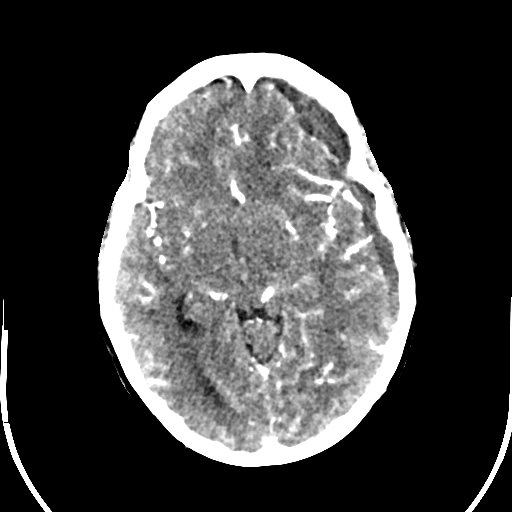
[im 45/78  bone]
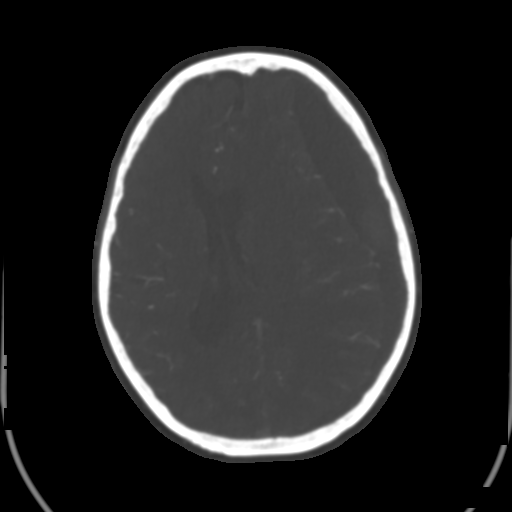
[im 56/78  brain]
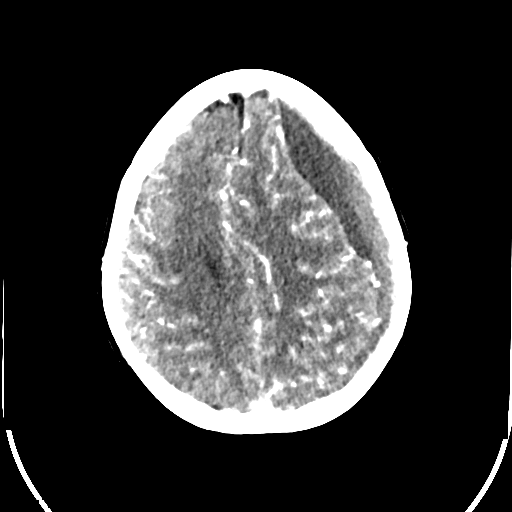
[im 67/78  bone]
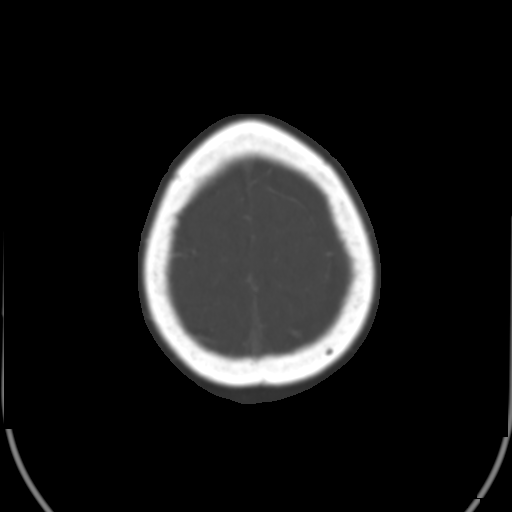

[6 of 47 positions shown; findings below may reference images not displayed]

FINDINGS: Posterior circulation: Codominant distal vertebral arteries with
mild irregularity but no stenosis. Patent PICA origins. Patent
vertebrobasilar junction. Patent basilar artery. Mild distal basilar
irregularity without significant stenosis. Patent SCA and PCA
origins. Posterior communicating arteries are diminutive or absent.
Mild bilateral P1 and P2 segment irregularity without stenosis.

Anterior circulation: Patent distal cervical ICAs. Severe bilateral
ICA siphon calcified plaque. Both siphons are patent but there is
severe bilateral siphon stenosis affecting the left cavernous
segment and right anterior genu (series 7 image 115). Normal
ophthalmic artery origins.

Patent carotid termini. Normal MCA and ACA origins. The left A1
segment is mildly dominant. Anterior communicating artery and
bilateral ACA branches are within normal limits aside from mild
rightward deviation. Right MCA M1 segment, bifurcation, and right
MCA branches are within normal limits.

Left MCA M1 segment is patent without stenosis. Left MCA bifurcation
and left MCA branches appear normal aside from medial displacement
from the left subdural hematoma. No active contrast extravasation
into the mixed density left subdural collection is identified.

Venous sinuses: Patent on the delayed phase images.

Anatomic variants: Mildly dominant left A1 segment.

Delayed phase: No abnormal enhancement identified. Stable
intracranial mass effect with rightward midline shift of 14 mm.
Mixed density left subdural collection measures up to 23 mm in
thickness at the level of the operculum.

Review of the MIP images confirms the above findings
IMPRESSION: 1. Intracranial CTA is negative for aneurysm or left subdural space
contrast extravasation, but positive for high-grade bilateral ICA
siphon stenosis related to bulky calcified plaque.
2. Other intracranial atherosclerosis without significant stenosis.
3. Mixed density left subdural hematoma measuring up to 2.3 cm in
thickness and with rightward midline shift of 14 mm is stable since
[DATE] hours today.

## 2018-12-30 ENCOUNTER — Encounter: Payer: Self-pay | Admitting: Family Medicine

## 2018-12-31 MED ORDER — ATORVASTATIN CALCIUM 40 MG PO TABS: 40.0000 mg | ORAL_TABLET | Freq: Every day | ORAL | 5 refills | Status: DC

## 2019-01-13 ENCOUNTER — Other Ambulatory Visit: Payer: Self-pay | Admitting: *Deleted

## 2019-01-13 ENCOUNTER — Encounter: Payer: Self-pay | Admitting: Family Medicine

## 2019-01-13 NOTE — Telephone Encounter (Signed)
Received fax requesting refill on tramadol.   Ok to refill??  Last office visit 09/14/2018.  Last refill 11/09/2018, #1 refill.

## 2019-01-14 MED ORDER — TRAMADOL HCL 50 MG PO TABS: 50.0000 mg | ORAL_TABLET | Freq: Three times a day (TID) | ORAL | 1 refills | Status: DC | PRN

## 2019-01-14 MED ORDER — TRAMADOL HCL 50 MG PO TABS: 50.0000 mg | ORAL_TABLET | Freq: Three times a day (TID) | ORAL | 0 refills | Status: DC | PRN

## 2019-02-05 ENCOUNTER — Encounter: Payer: Self-pay | Admitting: Family Medicine

## 2019-02-05 MED ORDER — DULAGLUTIDE 1.5 MG/0.5ML ~~LOC~~ SOAJ: 1.5000 mg | SUBCUTANEOUS | 3 refills | Status: DC

## 2019-02-08 ENCOUNTER — Encounter: Payer: Self-pay | Admitting: Family Medicine

## 2019-02-08 MED ORDER — ZOSTER VAC RECOMB ADJUVANTED 50 MCG/0.5ML IM SUSR: 0.5000 mL | Freq: Once | INTRAMUSCULAR | 1 refills | Status: AC

## 2019-02-09 MED ORDER — ZOSTER VAC RECOMB ADJUVANTED 50 MCG/0.5ML IM SUSR: 0.5000 mL | Freq: Once | INTRAMUSCULAR | 1 refills | Status: AC

## 2019-02-09 NOTE — Addendum Note (Signed)
Addended by: Shary Decamp B on: 02/09/2019 09:02 AM   Modules accepted: Orders

## 2019-02-17 ENCOUNTER — Encounter: Payer: Self-pay | Admitting: Family Medicine

## 2019-02-18 ENCOUNTER — Ambulatory Visit (INDEPENDENT_AMBULATORY_CARE_PROVIDER_SITE_OTHER): Payer: BC Managed Care – PPO | Admitting: Family Medicine

## 2019-02-18 ENCOUNTER — Other Ambulatory Visit: Payer: Self-pay

## 2019-02-18 ENCOUNTER — Encounter: Payer: Self-pay | Admitting: Family Medicine

## 2019-02-18 VITALS — BP 130/70 | HR 78 | Temp 98.2°F | Resp 18 | Ht 61.0 in | Wt 115.0 lb

## 2019-02-18 DIAGNOSIS — E119 Type 2 diabetes mellitus without complications: Secondary | ICD-10-CM

## 2019-02-18 DIAGNOSIS — N183 Chronic kidney disease, stage 3 unspecified: Secondary | ICD-10-CM

## 2019-02-18 DIAGNOSIS — M7552 Bursitis of left shoulder: Secondary | ICD-10-CM | POA: Diagnosis not present

## 2019-02-18 DIAGNOSIS — I10 Essential (primary) hypertension: Secondary | ICD-10-CM

## 2019-02-18 NOTE — Progress Notes (Signed)
Subjective:    Patient ID: Tracy Price, female    DOB: 1956-03-02, 63 y.o.   MRN: [id number]  HPI Patient presents today with a one-week history of pain in her left shoulder.  The pain is located in the subacromial space.  It hurts to abduct her arm greater than 90 degrees.  It hurts to sleep on that arm.  It aches and throbs at night.  It hurts with internal and external rotation.  Activities such as combing her hair or putting on a shirt causes pain.  She denies any decreased strength in her arm.  She denies any fall or injury that could have elicited it.  She believes the pain began gradually due to overuse.  She is had similar pain in her right shoulder before that responded to a cortisone injection.  She has been trying tramadol with very little relief.  She is here today requesting a cortisone injection in her left shoulder.  She would also like to check lab work while she is here regarding her diabetes and cholesterol. Past Medical History:  Diagnosis Date  . Allergy   . Chest pain   . CKD (chronic kidney disease) stage 3, GFR 30-59 ml/min (HCC)   . Diabetes mellitus without complication ([place])    type 2  . Hyperlipidemia   . Hypertension   . Sleep apnea 1995   had surgery to correct   Past Surgical History:  Procedure Laterality Date  . CRANIOTOMY Left 03/05/2017   Procedure: LEFT BURR HOLE  HEMATOMA EVACUATION SUBDURAL;  Surgeon: Kary Kos, MD;  Location: [facility];  Service: Neurosurgery;  Laterality: Left;  Marland Kitchen GASTRIC BYPASS  2005  . orif arm    . TONSILLECTOMY     Current Outpatient Medications on File Prior to Visit  Medication Sig Dispense Refill  . acetaminophen (TYLENOL) 325 MG tablet Take 1-2 tablets (325-650 mg total) by mouth every 4 (four) hours as needed for mild pain.    Marland Kitchen ALPRAZolam (XANAX) 0.5 MG tablet TAKE 1 TABLET BY MOUTH AT NIGHT FOR ANXIETY 60 tablet 2  . amLODipine (NORVASC) 5 MG tablet TAKE 1 TABLET BY MOUTH EVERY DAY 90 tablet 3  . aspirin 81 MG chewable  tablet Chew by mouth daily.    Marland Kitchen atorvastatin (LIPITOR) 40 MG tablet Take 1 tablet (40 mg total) by mouth daily. 30 tablet 5  . Blood Glucose Monitoring Suppl (ONETOUCH VERIO) w/Device KIT Check BS BID (E11.9) 1 kit 2  . diclofenac (VOLTAREN) 75 MG EC tablet Take 1 tablet (75 mg total) by mouth 2 (two) times daily. 180 tablet 0  . Dulaglutide (TRULICITY) 1.5 NT/7.0YF SOPN Inject 1.5 mg into the skin once a week. 4 pen 3  . glucose blood (ONETOUCH VERIO) test strip Check BS BID (E11.9) 200 each 3  . losartan (COZAAR) 100 MG tablet TAKE 1 TABLET BY MOUTH EVERY DAY 90 tablet 3  . Melatonin 5 MG TABS Take by mouth.    Glory Rosebush DELICA LANCETS 74B MISC Check BS BID (E11.9) 200 each 3  . sitaGLIPtin (JANUVIA) 100 MG tablet TAKE 1 TABLET BY MOUTH DAILY 90 tablet 2  . traMADol (ULTRAM) 50 MG tablet Take 1 tablet (50 mg total) by mouth 3 (three) times daily as needed. 180 tablet 0   No current facility-administered medications on file prior to visit.    Allergies  Allergen Reactions  . Crestor [Rosuvastatin Calcium] Other (See Comments)    Mylgia  . Lipitor [Atorvastatin] Other (See Comments)  Mylagia   Social History   Socioeconomic History  . Marital status: Single    Spouse name: Not on file  . Number of children: Not on file  . Years of education: Not on file  . Highest education level: Not on file  Occupational History  . Not on file  Social Needs  . Financial resource strain: Not on file  . Food insecurity:    Worry: Not on file    Inability: Not on file  . Transportation needs:    Medical: Not on file    Non-medical: Not on file  Tobacco Use  . Smoking status: Never Smoker  . Smokeless tobacco: Never Used  Substance and Sexual Activity  . Alcohol use: No  . Drug use: No  . Sexual activity: Not on file  Lifestyle  . Physical activity:    Days per week: Not on file    Minutes per session: Not on file  . Stress: Not on file  Relationships  . Social connections:     Talks on phone: Not on file    Gets together: Not on file    Attends religious service: Not on file    Active member of club or organization: Not on file    Attends meetings of clubs or organizations: Not on file    Relationship status: Not on file  . Intimate partner violence:    Fear of current or ex partner: Not on file    Emotionally abused: Not on file    Physically abused: Not on file    Forced sexual activity: Not on file  Other Topics Concern  . Not on file  Social History Narrative  . Not on file      Review of Systems  All other systems reviewed and are negative.      Objective:   Physical Exam Vitals signs reviewed.  Constitutional:      General: She is not in acute distress.    Appearance: Normal appearance. She is normal weight. She is not ill-appearing or toxic-appearing.  Cardiovascular:     Rate and Rhythm: Normal rate and regular rhythm.     Heart sounds: Normal heart sounds.  Pulmonary:     Effort: Pulmonary effort is normal.     Breath sounds: Normal breath sounds.  Musculoskeletal:     Left shoulder: She exhibits decreased range of motion and pain. She exhibits no swelling, no effusion, no crepitus and normal strength.           Assessment & Plan:  Type 2 diabetes mellitus without complication, without long-term current use of insulin (HCC) - Plan: Hemoglobin A1c, CBC with Differential/Platelet, COMPLETE METABOLIC PANEL WITH GFR, Lipid panel, Microalbumin, urine  Stage 3 chronic kidney disease (HCC)  Benign essential HTN  Subacromial bursitis of left shoulder joint  Patient has subacromial bursitis in her left shoulder.  Due to her chronic kidney disease, I want to avoid NSAIDs.  Therefore using sterile technique, I injected the left subacromial space with 2 cc lidocaine, 2 cc of Marcaine, and 2 cc of 40 mg/mL Kenalog.  The patient tolerated the procedure well without complication.  While the patient is here today, we will also check  hemoglobin A1c, CBC, CMP, and a fasting lipid panel.  The patient is not fasting however given her difficulty driving back and forth for lab work in the current COVID-19 quarantine I will get the lab work while she is here to try to prevent another trip.

## 2019-02-19 LAB — CBC WITH DIFFERENTIAL/PLATELET
Absolute Monocytes: 445 cells/uL (ref 200–950)
Basophils Absolute: 61 cells/uL (ref 0–200)
Basophils Relative: 1 %
Eosinophils Absolute: 207 cells/uL (ref 15–500)
Eosinophils Relative: 3.4 %
HCT: 37.3 % (ref 35.0–45.0)
Hemoglobin: 12.6 g/dL (ref 11.7–15.5)
Lymphs Abs: 1470 cells/uL (ref 850–3900)
MCH: 32 pg (ref 27.0–33.0)
MCHC: 33.8 g/dL (ref 32.0–36.0)
MCV: 94.7 fL (ref 80.0–100.0)
MPV: 11.2 fL (ref 7.5–12.5)
Monocytes Relative: 7.3 %
Neutro Abs: 3916 cells/uL (ref 1500–7800)
Neutrophils Relative %: 64.2 %
Platelets: 235 10*3/uL (ref 140–400)
RBC: 3.94 10*6/uL (ref 3.80–5.10)
RDW: 12.4 % (ref 11.0–15.0)
Total Lymphocyte: 24.1 %
WBC: 6.1 10*3/uL (ref 3.8–10.8)

## 2019-02-19 LAB — COMPLETE METABOLIC PANEL WITH GFR
AG Ratio: 1.6 (calc) (ref 1.0–2.5)
ALT: 20 U/L (ref 6–29)
AST: 26 U/L (ref 10–35)
Albumin: 4.4 g/dL (ref 3.6–5.1)
Alkaline phosphatase (APISO): 115 U/L (ref 37–153)
BUN/Creatinine Ratio: 17 (calc) (ref 6–22)
BUN: 22 mg/dL (ref 7–25)
CO2: 26 mmol/L (ref 20–32)
Calcium: 9.4 mg/dL (ref 8.6–10.4)
Chloride: 106 mmol/L (ref 98–110)
Creat: 1.26 mg/dL — ABNORMAL HIGH (ref 0.50–0.99)
GFR, Est African American: 53 mL/min/{1.73_m2} — ABNORMAL LOW (ref >=60)
GFR, Est Non African American: 46 mL/min/{1.73_m2} — ABNORMAL LOW (ref >=60)
Globulin: 2.7 g/dL (calc) (ref 1.9–3.7)
Glucose, Bld: 87 mg/dL (ref 65–99)
Potassium: 4.5 mmol/L (ref 3.5–5.3)
Sodium: 141 mmol/L (ref 135–146)
Total Bilirubin: 0.6 mg/dL (ref 0.2–1.2)
Total Protein: 7.1 g/dL (ref 6.1–8.1)

## 2019-02-19 LAB — LIPID PANEL
Cholesterol: 135 mg/dL (ref <200)
HDL: 55 mg/dL (ref >=50)
LDL Cholesterol (Calc): 63 mg/dL (calc)
Non-HDL Cholesterol (Calc): 80 mg/dL (calc) (ref <130)
Total CHOL/HDL Ratio: 2.5 (calc) (ref <5.0)
Triglycerides: 88 mg/dL (ref <150)

## 2019-02-19 LAB — HEMOGLOBIN A1C
Hgb A1c MFr Bld: 5.3 % of total Hgb (ref <5.7)
Mean Plasma Glucose: 105 (calc)
eAG (mmol/L): 5.8 (calc)

## 2019-02-19 LAB — MICROALBUMIN, URINE: Microalb, Ur: 2.8 mg/dL

## 2019-03-01 ENCOUNTER — Other Ambulatory Visit: Payer: Self-pay | Admitting: Family Medicine

## 2019-03-02 MED ORDER — DICLOFENAC SODIUM 75 MG PO TBEC: 75.0000 mg | DELAYED_RELEASE_TABLET | Freq: Two times a day (BID) | ORAL | 0 refills | Status: DC

## 2019-03-22 ENCOUNTER — Encounter: Payer: Self-pay | Admitting: Family Medicine

## 2019-03-30 ENCOUNTER — Encounter: Payer: Self-pay | Admitting: Family Medicine

## 2019-03-30 MED ORDER — AMLODIPINE BESYLATE 5 MG PO TABS: 5.0000 mg | ORAL_TABLET | Freq: Every day | ORAL | 3 refills | Status: DC

## 2019-04-11 ENCOUNTER — Other Ambulatory Visit: Payer: Self-pay | Admitting: Family Medicine

## 2019-04-12 NOTE — Telephone Encounter (Signed)
Ok to refill??  Last office visit 02/18/2019.  Last refill 01/14/2019.

## 2019-04-16 ENCOUNTER — Encounter: Payer: Self-pay | Admitting: Family Medicine

## 2019-04-16 ENCOUNTER — Ambulatory Visit: Payer: BC Managed Care – PPO | Admitting: Family Medicine

## 2019-04-16 ENCOUNTER — Other Ambulatory Visit: Payer: Self-pay

## 2019-04-16 VITALS — BP 136/94 | HR 98 | Temp 97.5°F | Resp 16 | Ht 61.0 in | Wt 109.0 lb

## 2019-04-16 DIAGNOSIS — L282 Other prurigo: Secondary | ICD-10-CM

## 2019-04-16 MED ORDER — PREDNISONE 20 MG PO TABS: ORAL_TABLET | ORAL | 0 refills | Status: DC

## 2019-04-16 MED ORDER — PERMETHRIN 5 % EX CREA: 1.0000 "application " | TOPICAL_CREAM | Freq: Once | CUTANEOUS | 0 refills | Status: AC

## 2019-04-16 NOTE — Progress Notes (Signed)
Subjective:    Patient ID: Tracy Price, female    DOB: 01-09-1956, 63 y.o.   MRN: [id number]  HPI Patient presents with a 7-day history of an itchy rash at multiple spots on her body.  The rash consist of pink indurated papules that are approximately 6 to 8 mm in diameter.  There is a papule located on the posterior left shoulder, on the upper inner right thigh, on the medial surfaces of her left and right knees, on her left forehead, and also on her right arm.  Each bump is indurated.  It is extremely itchy.  They are excoriated where she has been scratching it.  She denies any fevers chills or systemic symptoms.  Each bump appears to be a insect bite.  She has been doing renovations around her home.  She denies any possible exposure to chiggers or fleas.  She denies any possible exposure to scabies. Past Medical History:  Diagnosis Date  . Allergy   . Chest pain   . CKD (chronic kidney disease) stage 3, GFR 30-59 ml/min (HCC)   . Diabetes mellitus without complication ([place])    type 2  . Hyperlipidemia   . Hypertension   . Sleep apnea 1995   had surgery to correct   Past Surgical History:  Procedure Laterality Date  . CRANIOTOMY Left 03/05/2017   Procedure: LEFT BURR HOLE  HEMATOMA EVACUATION SUBDURAL;  Surgeon: Kary Kos, MD;  Location: [facility];  Service: Neurosurgery;  Laterality: Left;  Marland Kitchen GASTRIC BYPASS  2005  . orif arm    . TONSILLECTOMY     Current Outpatient Medications on File Prior to Visit  Medication Sig Dispense Refill  . acetaminophen (TYLENOL) 325 MG tablet Take 1-2 tablets (325-650 mg total) by mouth every 4 (four) hours as needed for mild pain.    Marland Kitchen ALPRAZolam (XANAX) 0.5 MG tablet TAKE 1 TABLET BY MOUTH AT NIGHT FOR ANXIETY 60 tablet 2  . amLODipine (NORVASC) 5 MG tablet Take 1 tablet (5 mg total) by mouth daily. 90 tablet 3  . atorvastatin (LIPITOR) 40 MG tablet Take 1 tablet (40 mg total) by mouth daily. 30 tablet 5  . Blood Glucose Monitoring Suppl (ONETOUCH  VERIO) w/Device KIT Check BS BID (E11.9) 1 kit 2  . diclofenac (VOLTAREN) 75 MG EC tablet TAKE 1 TABLET BY MOUTH TWICE A DAY 180 tablet 3  . diclofenac (VOLTAREN) 75 MG EC tablet Take 1 tablet (75 mg total) by mouth 2 (two) times daily. 180 tablet 0  . Dulaglutide (TRULICITY) 1.5 XL/2.4MW SOPN Inject 1.5 mg into the skin once a week. 4 pen 3  . glucose blood (ONETOUCH VERIO) test strip Check BS BID (E11.9) 200 each 3  . losartan (COZAAR) 100 MG tablet TAKE 1 TABLET BY MOUTH EVERY DAY 90 tablet 3  . Melatonin 5 MG TABS Take by mouth.    . sitaGLIPtin (JANUVIA) 100 MG tablet TAKE 1 TABLET BY MOUTH DAILY 90 tablet 2  . traMADol (ULTRAM) 50 MG tablet TAKE 1 TABLET BY MOUTH 3 (THREE) TIMES DAILY AS NEEDED. 180 tablet 0   No current facility-administered medications on file prior to visit.    Allergies  Allergen Reactions  . Crestor [Rosuvastatin Calcium] Other (See Comments)    Mylgia  . Lipitor [Atorvastatin] Other (See Comments)    Mylagia   Social History   Socioeconomic History  . Marital status: Single    Spouse name: Not on file  . Number of children: Not on file  .  Years of education: Not on file  . Highest education level: Not on file  Occupational History  . Not on file  Social Needs  . Financial resource strain: Not on file  . Food insecurity    Worry: Not on file    Inability: Not on file  . Transportation needs    Medical: Not on file    Non-medical: Not on file  Tobacco Use  . Smoking status: Never Smoker  . Smokeless tobacco: Never Used  Substance and Sexual Activity  . Alcohol use: No  . Drug use: No  . Sexual activity: Not on file  Lifestyle  . Physical activity    Days per week: Not on file    Minutes per session: Not on file  . Stress: Not on file  Relationships  . Social Herbalist on phone: Not on file    Gets together: Not on file    Attends religious service: Not on file    Active member of club or organization: Not on file    Attends  meetings of clubs or organizations: Not on file    Relationship status: Not on file  . Intimate partner violence    Fear of current or ex partner: Not on file    Emotionally abused: Not on file    Physically abused: Not on file    Forced sexual activity: Not on file  Other Topics Concern  . Not on file  Social History Narrative  . Not on file      Review of Systems  All other systems reviewed and are negative.      Objective:   Physical Exam Vitals signs reviewed.  Constitutional:      General: She is not in acute distress.    Appearance: Normal appearance. She is normal weight. She is not ill-appearing or toxic-appearing.  Cardiovascular:     Rate and Rhythm: Normal rate and regular rhythm.     Heart sounds: Normal heart sounds.  Pulmonary:     Effort: Pulmonary effort is normal.     Breath sounds: Normal breath sounds.  Skin:    Findings: Rash present. Rash is nodular and papular. Rash is not crusting, macular, purpuric, pustular, scaling or vesicular.           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  I have recommended a prednisone taper pack due to the itching that the patient has experienced and the fact she is tried Benadryl and steroid creams with no relief.  If the rash persist after the prednisone I would recommend a biopsy.  I have also recommended an empiric trial of Elimite cream in case this represents flea bites or possible scabies causing the diffuse itching rash although I believe this is unlikely

## 2019-04-19 ENCOUNTER — Ambulatory Visit: Payer: BC Managed Care – PPO | Admitting: Family Medicine

## 2019-05-10 ENCOUNTER — Other Ambulatory Visit: Payer: Self-pay | Admitting: Family Medicine

## 2019-06-04 ENCOUNTER — Other Ambulatory Visit: Payer: Self-pay | Admitting: Family Medicine

## 2019-06-05 ENCOUNTER — Encounter: Payer: Self-pay | Admitting: Family Medicine

## 2019-06-08 ENCOUNTER — Other Ambulatory Visit: Payer: Self-pay | Admitting: Family Medicine

## 2019-06-08 ENCOUNTER — Encounter: Payer: Self-pay | Admitting: Family Medicine

## 2019-06-09 NOTE — Telephone Encounter (Signed)
Requesting refill    Tramadol  LOV: 04/16/19  LRF:  04/12/19

## 2019-06-10 ENCOUNTER — Encounter: Payer: Self-pay | Admitting: Family Medicine

## 2019-06-10 MED ORDER — TRAMADOL HCL 50 MG PO TABS: ORAL_TABLET | ORAL | 0 refills | Status: DC

## 2019-07-12 ENCOUNTER — Encounter: Payer: Self-pay | Admitting: Family Medicine

## 2019-07-12 MED ORDER — ALPRAZOLAM 0.5 MG PO TABS: ORAL_TABLET | ORAL | 2 refills | Status: DC

## 2019-07-12 NOTE — Telephone Encounter (Signed)
Pt is requesting refill on Xanax   LOV: 7/10/  LRF:   12/24/18

## 2019-07-13 ENCOUNTER — Other Ambulatory Visit: Payer: Self-pay | Admitting: Family Medicine

## 2019-07-16 ENCOUNTER — Other Ambulatory Visit: Payer: Self-pay | Admitting: Family Medicine

## 2019-07-16 DIAGNOSIS — Z1231 Encounter for screening mammogram for malignant neoplasm of breast: Secondary | ICD-10-CM

## 2019-08-03 ENCOUNTER — Other Ambulatory Visit: Payer: Self-pay | Admitting: Family Medicine

## 2019-08-04 ENCOUNTER — Encounter: Payer: Self-pay | Admitting: Family Medicine

## 2019-08-04 NOTE — Telephone Encounter (Signed)
Ok to refill??  Last office visit 04/16/2019.  Last refill 06/10/2019.

## 2019-08-04 NOTE — Telephone Encounter (Signed)
Requesting refill with refills Tramadol  (I put refills on RX - if you do not want her to have refills just please remove - per DEA this can be filled for 6 months and her rx if for 2 months worth of pills)   LOV:  04/16/19  LRF:  06/10/19

## 2019-08-05 MED ORDER — TRAMADOL HCL 50 MG PO TABS: ORAL_TABLET | ORAL | 2 refills | Status: DC

## 2019-08-25 ENCOUNTER — Encounter: Payer: Self-pay | Admitting: Family Medicine

## 2019-09-01 ENCOUNTER — Ambulatory Visit: Payer: BC Managed Care – PPO

## 2019-09-07 ENCOUNTER — Other Ambulatory Visit: Payer: Self-pay | Admitting: Family Medicine

## 2019-09-22 ENCOUNTER — Encounter: Payer: Self-pay | Admitting: Family Medicine

## 2019-10-18 ENCOUNTER — Ambulatory Visit: Payer: BC Managed Care – PPO

## 2019-10-28 ENCOUNTER — Other Ambulatory Visit: Payer: Self-pay | Admitting: Family Medicine

## 2019-10-28 ENCOUNTER — Ambulatory Visit: Payer: BC Managed Care – PPO | Admitting: Family Medicine

## 2019-10-29 ENCOUNTER — Ambulatory Visit: Payer: BC Managed Care – PPO | Admitting: Family Medicine

## 2019-11-01 ENCOUNTER — Ambulatory Visit: Payer: BC Managed Care – PPO | Admitting: Family Medicine

## 2019-11-02 ENCOUNTER — Encounter: Payer: Self-pay | Admitting: Family Medicine

## 2019-11-02 ENCOUNTER — Other Ambulatory Visit: Payer: Self-pay

## 2019-11-02 ENCOUNTER — Ambulatory Visit: Payer: BC Managed Care – PPO | Admitting: Family Medicine

## 2019-11-02 VITALS — BP 136/82 | HR 90 | Temp 97.4°F | Resp 16 | Ht 61.0 in | Wt 109.0 lb

## 2019-11-02 DIAGNOSIS — N183 Chronic kidney disease, stage 3 unspecified: Secondary | ICD-10-CM

## 2019-11-02 DIAGNOSIS — I10 Essential (primary) hypertension: Secondary | ICD-10-CM

## 2019-11-02 DIAGNOSIS — E119 Type 2 diabetes mellitus without complications: Secondary | ICD-10-CM

## 2019-11-02 NOTE — Progress Notes (Signed)
Subjective:    Patient ID: Tracy Price, female    DOB: 07/23/1956, 64 y.o.   MRN: [id number]  HPI  Patient is here today for a checkup.  Her last mammogram was 2019 and appears to be due.  I do not see a Pap smear in the last 3 years which appears to be due.  She is also due for a diabetic eye exam as well as a diabetic foot exam.  She is also due for fasting lab work including hemoglobin A1c, CMP, fasting lipid panel, and a urine microalbumin.  Patient has canceled her mammogram and would like to wait until after the COVID-19 pandemic has improved before going for this.  We also discussed her Pap smear today.  She would prefer to see a female provider to have this done and I gave her instructions on how to schedule a follow-up appointment to do that.  Her immunizations are all up-to-date including her pneumonia shot and flu shot.  Diabetic foot exam was performed today and is outstanding.  There is no evidence of any neuropathy or peripheral vascular disease.  It has been more than 2 years since her diabetic eye exam.  However her insurance will not cover this per her report and therefore she has deferred this at the present time.  She denies any chest pain shortness of breath or dyspnea on exertion.  She denies any myalgias or right upper quadrant pain.  She denies any polyuria, polydipsia, blurry vision.  He does report tinnitus in her left ear and physical exam reveals a cerumen impaction in the left auditory canal.  Past Medical History:  Diagnosis Date  . Allergy   . Chest pain   . CKD (chronic kidney disease) stage 3, GFR 30-59 ml/min   . Diabetes mellitus without complication ([place])    type 2  . Hyperlipidemia   . Hypertension   . Sleep apnea 1995   had surgery to correct  . 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 CRANIOTOMY Left 03/05/2017   Procedure: LEFT BURR HOLE  HEMATOMA EVACUATION SUBDURAL;  Surgeon: Kary Kos, MD;  Location: [facility];   Service: Neurosurgery;  Laterality: Left;  Marland Kitchen GASTRIC BYPASS  2005  . orif arm    . TONSILLECTOMY     Current Outpatient Medications on File Prior to Visit  Medication Sig Dispense Refill  . acetaminophen (TYLENOL) 325 MG tablet Take 1-2 tablets (325-650 mg total) by mouth every 4 (four) hours as needed for mild pain.    Marland Kitchen ALPRAZolam (XANAX) 0.5 MG tablet TAKE 1 TABLET BY MOUTH AT NIGHT FOR ANXIETY 60 tablet 2  . amLODipine (NORVASC) 5 MG tablet Take 1 tablet (5 mg total) by mouth daily. 90 tablet 3  . atorvastatin (LIPITOR) 40 MG tablet TAKE 1 TABLET BY MOUTH EVERY DAY 90 tablet 1  . Blood Glucose Monitoring Suppl (ONETOUCH VERIO) w/Device KIT Check BS BID (E11.9) 1 kit 2  . diclofenac (VOLTAREN) 75 MG EC tablet TAKE 1 TABLET BY MOUTH TWICE A DAY 180 tablet 3  . diclofenac (VOLTAREN) 75 MG EC tablet TAKE 1 TABLET BY MOUTH TWICE A DAY 180 tablet 2  . glucose blood (ONETOUCH VERIO) test strip Check BS BID (E11.9) 200 each 3  . losartan (COZAAR) 100 MG tablet TAKE 1 TABLET BY MOUTH EVERY DAY 90 tablet 3  . Melatonin 5 MG TABS Take by mouth.    . predniSONE (DELTASONE) 20 MG tablet  3 tabs poqday 1-2, 2 tabs poqday 3-4, 1 tab poqday 5-6 12 tablet 0  . sitaGLIPtin (JANUVIA) 100 MG tablet TAKE 1 TABLET BY MOUTH EVERY DAY 90 tablet 2  . traMADol (ULTRAM) 50 MG tablet TAKE 1 TABLET BY MOUTH THREE TIMES A DAY AS NEEDED 180 tablet 0  . traMADol (ULTRAM) 50 MG tablet TAKE 1 TABLET BY MOUTH 3 (THREE) TIMES DAILY AS NEEDED. 180 tablet 2  . TRULICITY 1.5 RF/1.6BW SOPN INJECT 1.5 MG INTO THE SKIN ONCE A WEEK. 8 pen 2   No current facility-administered medications on file prior to visit.   Allergies  Allergen Reactions  . Crestor [Rosuvastatin Calcium] Other (See Comments)    Mylgia  . Lipitor [Atorvastatin] Other (See Comments)    Mylagia   Social History   Socioeconomic History  . Marital status: Single    Spouse name: Not on file  . Number of children: Not on file  . Years of education: Not  on file  . Highest education level: Not on file  Occupational History  . Not on file  Tobacco Use  . Smoking status: Never Smoker  . Smokeless tobacco: Never Used  Substance and Sexual Activity  . Alcohol use: No  . Drug use: No  . Sexual activity: Not on file  Other Topics Concern  . Not on file  Social History Narrative  . Not on file   Social Determinants of Health   Financial Resource Strain:   . Difficulty of Paying Living Expenses: Not on file  Food Insecurity:   . Worried About Charity fundraiser in the Last Year: Not on file  . Ran Out of Food in the Last Year: Not on file  Transportation Needs:   . Lack of Transportation (Medical): Not on file  . Lack of Transportation (Non-Medical): Not on file  Physical Activity:   . Days of Exercise per Week: Not on file  . Minutes of Exercise per Session: Not on file  Stress:   . Feeling of Stress : Not on file  Social Connections:   . Frequency of Communication with Friends and Family: Not on file  . Frequency of Social Gatherings with Friends and Family: Not on file  . Attends Religious Services: Not on file  . Active Member of Clubs or Organizations: Not on file  . Attends Archivist Meetings: Not on file  . Marital Status: Not on file  Intimate Partner Violence:   . Fear of Current or Ex-Partner: Not on file  . Emotionally Abused: Not on file  . Physically Abused: Not on file  . Sexually Abused: Not on file      Review of Systems  All other systems reviewed and are negative.      Objective:   Physical Exam Vitals reviewed.  Constitutional:      General: She is not in acute distress.    Appearance: Normal appearance. She is normal weight. She is not ill-appearing or toxic-appearing.  Cardiovascular:     Rate and Rhythm: Normal rate and regular rhythm.     Heart sounds: Normal heart sounds.  Pulmonary:     Effort: Pulmonary effort is normal.     Breath sounds: Normal breath sounds.            Assessment & Plan:  Type 2 diabetes mellitus without complication, without long-term current use of insulin (HCC) - Plan: Hemoglobin A1c, CBC with Differential/Platelet, COMPLETE METABOLIC PANEL WITH GFR, Lipid panel, Microalbumin, urine  Stage 3 chronic kidney disease, unspecified whether stage 3a or 3b CKD  Benign essential HTN Cerumen impaction was removed with irrigation and lavage.  Blood pressure today is excellent 136/82.  I will make no changes in her antihypertensives at this time.  I will check a hemoglobin A1c.  Goal hemoglobin A1c is less than 6.5.  Monitor her renal function with a CMP.  Check a urine microalbumin to evaluate for any evidence of diabetic nephropathy.  Monitor her cholesterol with a fasting lipid panel.  She has a history of severe bilateral carotid siphon plaque and therefore I would like her LDL cholesterol to be less than 70 to prevent progression of this.  She has a strong family history of stroke in her mother therefore I would be very aggressive in treating her cholesterol.  Recommended a mammogram and a Pap smear at her earliest convenience.  Also encouraged a diabetic eye exam.

## 2019-11-03 LAB — CBC WITH DIFFERENTIAL/PLATELET
Absolute Monocytes: 330 cells/uL (ref 200–950)
Basophils Absolute: 50 cells/uL (ref 0–200)
Basophils Relative: 0.9 %
Eosinophils Absolute: 252 cells/uL (ref 15–500)
Eosinophils Relative: 4.5 %
HCT: 39.5 % (ref 35.0–45.0)
Hemoglobin: 13.3 g/dL (ref 11.7–15.5)
Lymphs Abs: 1618 cells/uL (ref 850–3900)
MCH: 32.4 pg (ref 27.0–33.0)
MCHC: 33.7 g/dL (ref 32.0–36.0)
MCV: 96.3 fL (ref 80.0–100.0)
MPV: 10.2 fL (ref 7.5–12.5)
Monocytes Relative: 5.9 %
Neutro Abs: 3349 cells/uL (ref 1500–7800)
Neutrophils Relative %: 59.8 %
Platelets: 221 10*3/uL (ref 140–400)
RBC: 4.1 10*6/uL (ref 3.80–5.10)
RDW: 12.8 % (ref 11.0–15.0)
Total Lymphocyte: 28.9 %
WBC: 5.6 10*3/uL (ref 3.8–10.8)

## 2019-11-03 LAB — LIPID PANEL
Cholesterol: 150 mg/dL (ref <200)
HDL: 63 mg/dL (ref >=50)
LDL Cholesterol (Calc): 70 mg/dL (calc)
Non-HDL Cholesterol (Calc): 87 mg/dL (calc) (ref <130)
Total CHOL/HDL Ratio: 2.4 (calc) (ref <5.0)
Triglycerides: 87 mg/dL (ref <150)

## 2019-11-03 LAB — COMPLETE METABOLIC PANEL WITH GFR
AG Ratio: 1.6 (calc) (ref 1.0–2.5)
ALT: 37 U/L — ABNORMAL HIGH (ref 6–29)
AST: 30 U/L (ref 10–35)
Albumin: 4.2 g/dL (ref 3.6–5.1)
Alkaline phosphatase (APISO): 107 U/L (ref 37–153)
BUN/Creatinine Ratio: 16 (calc) (ref 6–22)
BUN: 19 mg/dL (ref 7–25)
CO2: 24 mmol/L (ref 20–32)
Calcium: 9.1 mg/dL (ref 8.6–10.4)
Chloride: 102 mmol/L (ref 98–110)
Creat: 1.21 mg/dL — ABNORMAL HIGH (ref 0.50–0.99)
GFR, Est African American: 56 mL/min/{1.73_m2} — ABNORMAL LOW (ref >=60)
GFR, Est Non African American: 48 mL/min/{1.73_m2} — ABNORMAL LOW (ref >=60)
Globulin: 2.6 g/dL (calc) (ref 1.9–3.7)
Glucose, Bld: 90 mg/dL (ref 65–99)
Potassium: 4 mmol/L (ref 3.5–5.3)
Sodium: 138 mmol/L (ref 135–146)
Total Bilirubin: 0.6 mg/dL (ref 0.2–1.2)
Total Protein: 6.8 g/dL (ref 6.1–8.1)

## 2019-11-03 LAB — HEMOGLOBIN A1C
Hgb A1c MFr Bld: 5.5 % of total Hgb (ref <5.7)
Mean Plasma Glucose: 111 (calc)
eAG (mmol/L): 6.2 (calc)

## 2019-11-03 LAB — MICROALBUMIN, URINE: Microalb, Ur: 3.9 mg/dL

## 2019-11-04 ENCOUNTER — Encounter: Payer: Self-pay | Admitting: *Deleted

## 2019-11-15 ENCOUNTER — Ambulatory Visit: Payer: BC Managed Care – PPO

## 2019-11-18 ENCOUNTER — Ambulatory Visit: Payer: BC Managed Care – PPO

## 2019-11-18 ENCOUNTER — Ambulatory Visit: Payer: BC Managed Care – PPO | Attending: Internal Medicine

## 2019-11-18 DIAGNOSIS — Z23 Encounter for immunization: Secondary | ICD-10-CM | POA: Insufficient documentation

## 2019-11-18 NOTE — Progress Notes (Signed)
   Covid-19 Vaccination Clinic  Name:  Tracy Price    MRN: [id number] DOB: November 25, 1955  11/18/2019  [name] was observed post Covid-19 immunization for 15 minutes without incidence. She was provided with Vaccine Information Sheet and instruction to access the V-Safe system.   [name] was instructed to call 911 with any severe reactions post vaccine: Marland Kitchen Difficulty breathing  . Swelling of your face and throat  . A fast heartbeat  . A bad rash all over your body  . Dizziness and weakness    Immunizations Administered    Name Date Dose VIS Date Route   Pfizer COVID-19 Vaccine 11/18/2019 11:53 AM 0.3 mL 09/17/2019 Intramuscular   Manufacturer: [place]   Lot: [id number]   [place]: [id number]

## 2019-11-19 ENCOUNTER — Encounter: Payer: Self-pay | Admitting: Family Medicine

## 2019-11-26 ENCOUNTER — Other Ambulatory Visit: Payer: Self-pay | Admitting: Family Medicine

## 2019-12-11 ENCOUNTER — Ambulatory Visit: Payer: BC Managed Care – PPO | Attending: Internal Medicine

## 2019-12-11 DIAGNOSIS — Z23 Encounter for immunization: Secondary | ICD-10-CM | POA: Insufficient documentation

## 2019-12-11 NOTE — Progress Notes (Signed)
   Covid-19 Vaccination Clinic  Name:  Tracy Price    MRN: [id number] DOB: 10/08/1955  12/11/2019  Tracy Price was observed post Covid-19 immunization for 15 minutes without incident. She was provided with Vaccine Information Sheet and instruction to access the V-Safe system.   Tracy Price was instructed to call 911 with any severe reactions post vaccine: Marland Kitchen Difficulty breathing  . Swelling of face and throat  . A fast heartbeat  . A bad rash all over body  . Dizziness and weakness   Immunizations Administered    Name Date Dose VIS Date Route   Pfizer COVID-19 Vaccine 12/11/2019 10:44 AM 0.3 mL 09/17/2019 Intramuscular   Manufacturer: [place]   Lot: [id number]   [place]: [id number]

## 2019-12-20 ENCOUNTER — Encounter: Payer: Self-pay | Admitting: Family Medicine

## 2019-12-20 ENCOUNTER — Other Ambulatory Visit: Payer: Self-pay | Admitting: Family Medicine

## 2019-12-20 MED ORDER — PANTOPRAZOLE SODIUM 40 MG PO TBEC: 40.0000 mg | DELAYED_RELEASE_TABLET | Freq: Every day | ORAL | 3 refills | Status: DC

## 2019-12-22 ENCOUNTER — Other Ambulatory Visit: Payer: Self-pay | Admitting: Family Medicine

## 2020-01-06 ENCOUNTER — Encounter: Payer: Self-pay | Admitting: Family Medicine

## 2020-01-06 ENCOUNTER — Other Ambulatory Visit: Payer: Self-pay | Admitting: Family Medicine

## 2020-01-06 MED ORDER — ALPRAZOLAM 0.5 MG PO TABS: ORAL_TABLET | ORAL | 2 refills | Status: DC

## 2020-01-06 NOTE — Telephone Encounter (Signed)
Pt is requesting refill on Xanax   LOV:  11/02/2019  LRF:   07/12/2019

## 2020-01-16 ENCOUNTER — Other Ambulatory Visit: Payer: Self-pay | Admitting: Family Medicine

## 2020-01-26 ENCOUNTER — Other Ambulatory Visit: Payer: Self-pay | Admitting: Family Medicine

## 2020-01-26 DIAGNOSIS — Z1231 Encounter for screening mammogram for malignant neoplasm of breast: Secondary | ICD-10-CM

## 2020-01-27 DIAGNOSIS — Z1231 Encounter for screening mammogram for malignant neoplasm of breast: Secondary | ICD-10-CM

## 2020-01-28 ENCOUNTER — Ambulatory Visit: Payer: BC Managed Care – PPO | Admitting: Family Medicine

## 2020-01-28 ENCOUNTER — Encounter: Payer: Self-pay | Admitting: Family Medicine

## 2020-01-28 ENCOUNTER — Other Ambulatory Visit: Payer: Self-pay

## 2020-01-28 ENCOUNTER — Ambulatory Visit
Admission: RE | Admit: 2020-01-28 | Discharge: 2020-01-28 | Disposition: A | Discharge status: Home / Self Care | Payer: BC Managed Care – PPO | Source: Ambulatory Visit | Attending: Family Medicine | Admitting: Family Medicine

## 2020-01-28 VITALS — BP 120/78 | HR 88 | Temp 97.6°F | Resp 16 | Ht 61.0 in | Wt 108.0 lb

## 2020-01-28 DIAGNOSIS — M7552 Bursitis of left shoulder: Secondary | ICD-10-CM | POA: Diagnosis not present

## 2020-01-28 DIAGNOSIS — Z1231 Encounter for screening mammogram for malignant neoplasm of breast: Secondary | ICD-10-CM

## 2020-01-28 DIAGNOSIS — E119 Type 2 diabetes mellitus without complications: Secondary | ICD-10-CM | POA: Diagnosis not present

## 2020-01-28 NOTE — Progress Notes (Signed)
Subjective:    Patient ID: Tracy Price, female    DOB: 1956-02-18, 64 y.o.   MRN: [id number]  HPI Patient presents with a 1 month history of pain in her left shoulder.  She has pain with active abduction greater than 90 degrees.  She has pain with passive abduction greater than 100 degrees.  She has no pain with internal or external rotation.  She does have crepitus in the shoulder with range of motion.  She complains of pain directly under the acromial process in the subacromial space.  She states that it hurts to sleep on her shoulder at night.  She has a negative O'Brien sign.  She has a negative drop sign.  She has a negative Yergason and Speed sign. Past Medical History:  Diagnosis Date  . Allergy   . Chest pain   . CKD (chronic kidney disease) stage 3, GFR 30-59 ml/min   . Diabetes mellitus without complication ([place])    type 2  . Hyperlipidemia   . Hypertension   . Sleep apnea 1995   had surgery to correct  . Subdural hematoma [facility])    Past Surgical History:  Procedure Laterality Date  . CRANIOTOMY Left 03/05/2017   Procedure: LEFT BURR HOLE  HEMATOMA EVACUATION SUBDURAL;  Surgeon: Kary Kos, MD;  Location: [place];  Service: Neurosurgery;  Laterality: Left;  Marland Kitchen GASTRIC BYPASS  2005  . orif arm    . TONSILLECTOMY     Current Outpatient Medications on File Prior to Visit  Medication Sig Dispense Refill  . acetaminophen (TYLENOL) 325 MG tablet Take 1-2 tablets (325-650 mg total) by mouth every 4 (four) hours as needed for mild pain.    Marland Kitchen ALPRAZolam (XANAX) 0.5 MG tablet TAKE 1 TABLET BY MOUTH AT NIGHT FOR ANXIETY 60 tablet 2  . amLODipine (NORVASC) 5 MG tablet TAKE 1 TABLET BY MOUTH EVERY DAY 90 tablet 3  . atorvastatin (LIPITOR) 40 MG tablet TAKE 1 TABLET BY MOUTH EVERY DAY 90 tablet 1  . Blood Glucose Monitoring Suppl (ONETOUCH VERIO) w/Device KIT Check BS BID (E11.9) 1 kit 2  . losartan (COZAAR) 100 MG tablet TAKE 1 TABLET BY MOUTH EVERY DAY 90 tablet 3  . Melatonin 5 MG  TABS Take by mouth.    Glory Rosebush VERIO test strip TEST TWICE A DAY 200 strip 3  . pantoprazole (PROTONIX) 40 MG tablet Take 1 tablet (40 mg total) by mouth daily. 90 tablet 3  . traMADol (ULTRAM) 50 MG tablet TAKE 1 TABLET BY MOUTH THREE TIMES A DAY AS NEEDED 347 tablet 0  . TRULICITY 1.5 QQ/5.9DG SOPN INJECT 0.5 MLS INTO THE SKIN ONCE A WEEK 4 pen 2  . diclofenac (VOLTAREN) 75 MG EC tablet TAKE 1 TABLET BY MOUTH TWICE A DAY (Patient not taking: Reported on 01/28/2020) 180 tablet 3  . sitaGLIPtin (JANUVIA) 100 MG tablet TAKE 1 TABLET BY MOUTH EVERY DAY (Patient not taking: Reported on 01/28/2020) 90 tablet 2   No current facility-administered medications on file prior to visit.   Allergies  Allergen Reactions  . Crestor [Rosuvastatin Calcium] Other (See Comments)    Mylgia  . Lipitor [Atorvastatin] Other (See Comments)    Mylagia   Social History   Socioeconomic History  . Marital status: Single    Spouse name: Not on file  . Number of children: Not on file  . Years of education: Not on file  . Highest education level: Not on file  Occupational History  . Not  on file  Tobacco Use  . Smoking status: Never Smoker  . Smokeless tobacco: Never Used  Substance and Sexual Activity  . Alcohol use: No  . Drug use: No  . Sexual activity: Not on file  Other Topics Concern  . Not on file  Social History Narrative  . Not on file   Social Determinants of Health   Financial Resource Strain:   . Difficulty of Paying Living Expenses:   Food Insecurity:   . Worried About Charity fundraiser in the Last Year:   . Arboriculturist in the Last Year:   Transportation Needs:   . Film/video editor (Medical):   Marland Kitchen Lack of Transportation (Non-Medical):   Physical Activity:   . Days of Exercise per Week:   . Minutes of Exercise per Session:   Stress:   . Feeling of Stress :   Social Connections:   . Frequency of Communication with Friends and Family:   . Frequency of Social Gatherings  with Friends and Family:   . Attends Religious Services:   . Active Member of Clubs or Organizations:   . Attends Archivist Meetings:   Marland Kitchen Marital Status:   Intimate Partner Violence:   . Fear of Current or Ex-Partner:   . Emotionally Abused:   Marland Kitchen Physically Abused:   . Sexually Abused:       Review of Systems  All other systems reviewed and are negative.      Objective:   Physical Exam Vitals reviewed.  Constitutional:      General: She is not in acute distress.    Appearance: Normal appearance. She is normal weight. She is not ill-appearing or toxic-appearing.  Cardiovascular:     Rate and Rhythm: Normal rate and regular rhythm.     Heart sounds: Normal heart sounds.  Pulmonary:     Effort: Pulmonary effort is normal.     Breath sounds: Normal breath sounds.  Musculoskeletal:     Left shoulder: Tenderness and crepitus present. No deformity or bony tenderness. Decreased range of motion. Decreased strength.  Skin:    Findings: Rash is not crusting, macular, purpuric, pustular, scaling or vesicular.           Assessment & Plan:  Subacromial bursitis of left shoulder joint  I suspect subacromial bursitis.  Using sterile technique, I injected the left subacromial space with 2 cc lidocaine, 2 cc of Marcaine, and 2 cc of 40 mg/mL Kenalog.  The patient tolerated procedure well without complication.

## 2020-02-03 ENCOUNTER — Encounter: Payer: Self-pay | Admitting: Family Medicine

## 2020-02-03 ENCOUNTER — Other Ambulatory Visit: Payer: Self-pay | Admitting: *Deleted

## 2020-02-03 MED ORDER — TRAMADOL HCL 50 MG PO TABS: ORAL_TABLET | ORAL | 0 refills | Status: DC

## 2020-02-03 MED ORDER — PANTOPRAZOLE SODIUM 40 MG PO TBEC: 40.0000 mg | DELAYED_RELEASE_TABLET | Freq: Two times a day (BID) | ORAL | 3 refills | Status: DC

## 2020-02-03 NOTE — Telephone Encounter (Signed)
Ok to refill Tramadol??  Last office visit 4/ 23/2021  Last refill 08/05/2019.

## 2020-02-28 ENCOUNTER — Other Ambulatory Visit: Payer: BC Managed Care – PPO

## 2020-02-29 ENCOUNTER — Other Ambulatory Visit: Payer: Self-pay | Admitting: *Deleted

## 2020-02-29 ENCOUNTER — Encounter: Payer: Self-pay | Admitting: Family Medicine

## 2020-02-29 DIAGNOSIS — R7989 Other specified abnormal findings of blood chemistry: Secondary | ICD-10-CM

## 2020-02-29 LAB — CBC WITH DIFFERENTIAL/PLATELET
Absolute Monocytes: 460 cells/uL (ref 200–950)
Basophils Absolute: 47 cells/uL (ref 0–200)
Basophils Relative: 0.8 %
Eosinophils Absolute: 148 cells/uL (ref 15–500)
Eosinophils Relative: 2.5 %
HCT: 39.3 % (ref 35.0–45.0)
Hemoglobin: 13.1 g/dL (ref 11.7–15.5)
Lymphs Abs: 1593 cells/uL (ref 850–3900)
MCH: 31 pg (ref 27.0–33.0)
MCHC: 33.3 g/dL (ref 32.0–36.0)
MCV: 93.1 fL (ref 80.0–100.0)
MPV: 10.5 fL (ref 7.5–12.5)
Monocytes Relative: 7.8 %
Neutro Abs: 3652 cells/uL (ref 1500–7800)
Neutrophils Relative %: 61.9 %
Platelets: 193 10*3/uL (ref 140–400)
RBC: 4.22 10*6/uL (ref 3.80–5.10)
RDW: 12.2 % (ref 11.0–15.0)
Total Lymphocyte: 27 %
WBC: 5.9 10*3/uL (ref 3.8–10.8)

## 2020-02-29 LAB — COMPLETE METABOLIC PANEL WITH GFR
AG Ratio: 1.7 (calc) (ref 1.0–2.5)
ALT: 63 U/L — ABNORMAL HIGH (ref 6–29)
AST: 45 U/L — ABNORMAL HIGH (ref 10–35)
Albumin: 4 g/dL (ref 3.6–5.1)
Alkaline phosphatase (APISO): 102 U/L (ref 37–153)
BUN/Creatinine Ratio: 22 (calc) (ref 6–22)
BUN: 31 mg/dL — ABNORMAL HIGH (ref 7–25)
CO2: 27 mmol/L (ref 20–32)
Calcium: 9 mg/dL (ref 8.6–10.4)
Chloride: 104 mmol/L (ref 98–110)
Creat: 1.42 mg/dL — ABNORMAL HIGH (ref 0.50–0.99)
GFR, Est African American: 45 mL/min/{1.73_m2} — ABNORMAL LOW (ref >=60)
GFR, Est Non African American: 39 mL/min/{1.73_m2} — ABNORMAL LOW (ref >=60)
Globulin: 2.4 g/dL (calc) (ref 1.9–3.7)
Glucose, Bld: 78 mg/dL (ref 65–99)
Potassium: 4.2 mmol/L (ref 3.5–5.3)
Sodium: 140 mmol/L (ref 135–146)
Total Bilirubin: 0.6 mg/dL (ref 0.2–1.2)
Total Protein: 6.4 g/dL (ref 6.1–8.1)

## 2020-02-29 LAB — LIPID PANEL
Cholesterol: 153 mg/dL (ref <200)
HDL: 72 mg/dL (ref >=50)
LDL Cholesterol (Calc): 68 mg/dL (calc)
Non-HDL Cholesterol (Calc): 81 mg/dL (calc) (ref <130)
Total CHOL/HDL Ratio: 2.1 (calc) (ref <5.0)
Triglycerides: 54 mg/dL (ref <150)

## 2020-02-29 LAB — HEMOGLOBIN A1C
Hgb A1c MFr Bld: 5.5 % of total Hgb (ref <5.7)
Mean Plasma Glucose: 111 (calc)
eAG (mmol/L): 6.2 (calc)

## 2020-02-29 LAB — MICROALBUMIN, URINE: Microalb, Ur: 1.9 mg/dL

## 2020-03-17 ENCOUNTER — Encounter: Payer: Self-pay | Admitting: Family Medicine

## 2020-03-17 MED ORDER — TRAMADOL HCL 50 MG PO TABS: ORAL_TABLET | ORAL | 0 refills | Status: DC

## 2020-03-17 NOTE — Telephone Encounter (Signed)
Last refilled: 02/03/2020 Last office visit: 01/28/2020

## 2020-03-23 ENCOUNTER — Other Ambulatory Visit: Payer: Self-pay | Admitting: Family Medicine

## 2020-03-29 ENCOUNTER — Other Ambulatory Visit: Payer: Self-pay | Admitting: Family Medicine

## 2020-03-29 NOTE — Telephone Encounter (Signed)
Ok to refill??  Last office visit 01/28/2020.  Last refill 03/17/2020.

## 2020-05-12 ENCOUNTER — Other Ambulatory Visit: Payer: Self-pay | Admitting: Family Medicine

## 2020-05-26 ENCOUNTER — Encounter: Payer: Self-pay | Admitting: Family Medicine

## 2020-05-26 ENCOUNTER — Other Ambulatory Visit: Payer: Self-pay | Admitting: Family Medicine

## 2020-05-26 MED ORDER — TRAMADOL HCL 50 MG PO TABS: 50.0000 mg | ORAL_TABLET | Freq: Three times a day (TID) | ORAL | 0 refills | Status: DC | PRN

## 2020-05-26 NOTE — Telephone Encounter (Signed)
Ok to refill??  Last office visit 01/28/2020.  Last refill 03/30/2020.

## 2020-05-28 ENCOUNTER — Other Ambulatory Visit: Payer: Self-pay | Admitting: Family Medicine

## 2020-06-02 ENCOUNTER — Encounter: Payer: Self-pay | Admitting: Family Medicine

## 2020-06-27 ENCOUNTER — Ambulatory Visit: Payer: BC Managed Care – PPO | Attending: Internal Medicine

## 2020-06-27 DIAGNOSIS — Z23 Encounter for immunization: Secondary | ICD-10-CM

## 2020-06-27 NOTE — Progress Notes (Signed)
   Covid-19 Vaccination Clinic  Name:  Tracy Price    MRN: [id number] DOB: 05-26-1956  06/27/2020  Tracy Price was observed post Covid-19 immunization for 15 minutes without incident. She was provided with Vaccine Information Sheet and instruction to access the V-Safe system.   Tracy Price was instructed to call 911 with any severe reactions post vaccine: Marland Kitchen Difficulty breathing  . Swelling of face and throat  . A fast heartbeat  . A bad rash all over body  . Dizziness and weakness

## 2020-07-08 ENCOUNTER — Other Ambulatory Visit: Payer: Self-pay | Admitting: Family Medicine

## 2020-07-12 ENCOUNTER — Encounter: Payer: Self-pay | Admitting: Family Medicine

## 2020-07-14 ENCOUNTER — Other Ambulatory Visit: Payer: Self-pay | Admitting: Family Medicine

## 2020-07-14 ENCOUNTER — Encounter: Payer: Self-pay | Admitting: Family Medicine

## 2020-07-14 ENCOUNTER — Other Ambulatory Visit: Payer: Self-pay | Admitting: *Deleted

## 2020-07-14 MED ORDER — MECLIZINE HCL 25 MG PO TABS: 25.0000 mg | ORAL_TABLET | Freq: Three times a day (TID) | ORAL | 0 refills | Status: DC | PRN

## 2020-07-14 NOTE — Telephone Encounter (Signed)
She can use meclizine 25 mg poq 8 hrs prn vertigo.  I'd be glad to see her Monday.

## 2020-07-14 NOTE — Telephone Encounter (Signed)
Patient sent MyChart message on Wed 07/12/2020. I advised her to go to UC if dizziness was severe.   MD please advise.

## 2020-07-17 ENCOUNTER — Ambulatory Visit: Payer: Self-pay | Admitting: Family Medicine

## 2020-07-21 ENCOUNTER — Ambulatory Visit: Payer: Self-pay | Admitting: Family Medicine

## 2020-07-24 ENCOUNTER — Encounter: Payer: Self-pay | Admitting: Family Medicine

## 2020-07-24 ENCOUNTER — Other Ambulatory Visit: Payer: Self-pay | Admitting: Family Medicine

## 2020-07-24 NOTE — Telephone Encounter (Signed)
Ok to refill??  Last office visit 01/28/2020.  Last refill 05/26/2020.

## 2020-07-27 ENCOUNTER — Ambulatory Visit: Payer: BC Managed Care – PPO | Admitting: Family Medicine

## 2020-07-27 ENCOUNTER — Other Ambulatory Visit: Payer: Self-pay

## 2020-07-27 ENCOUNTER — Encounter: Payer: Self-pay | Admitting: Family Medicine

## 2020-07-27 VITALS — BP 118/64 | HR 86 | Temp 97.8°F | Resp 10 | Ht 61.0 in | Wt 112.0 lb

## 2020-07-27 DIAGNOSIS — H8111 Benign paroxysmal vertigo, right ear: Secondary | ICD-10-CM | POA: Diagnosis not present

## 2020-07-27 MED ORDER — SCOPOLAMINE 1 MG/3DAYS TD PT72: 1.0000 | MEDICATED_PATCH | TRANSDERMAL | 12 refills | Status: DC

## 2020-07-27 NOTE — Progress Notes (Signed)
Subjective:    Patient ID: Tracy Price, female    DOB: 09-19-1956, 64 y.o.   MRN: [id number]  HPI  Patient is a very pleasant 64 year old Caucasian female who presents today with severe vertigo.  She states that her symptoms began on October 5.  She awoke and was walking to the bathroom when suddenly the room began to spin.  She states that she has been suffering with vertigo ever since.  It has been over 2 weeks now.  She especially notices the vertigo if she turns her head to the right or if she looks down and looks.  Whenever she does this the room will begin to spin.  For the last 2 weeks she has been unable to drive because every time she turns her head her environment starts to spin.  She denies any hearing loss or tinnitus that is new.  She denies any headaches.  She denies any other neurologic deficits such as facial droop, slurred speech, or unilateral weakness.  She denies any head trauma.  On examination today extraocular movements are intact.  Cranial nerves II through XII are grossly intact with muscle strength 5/5 equal and symmetric in the upper and lower extremities.  She does have a positive Dix-Hallpike maneuver to the right with 3 beats of horizontal nystagmus instantly upon turning her head to the right and I was able to reproduce her symptoms immediately upon doing that. Past Medical History:  Diagnosis Date  . Allergy   . Chest pain   . CKD (chronic kidney disease) stage 3, GFR 30-59 ml/min ([facility])   . Diabetes mellitus without complication ([place])    type 2  . Hyperlipidemia   . Hypertension   . Sleep apnea 1995   had surgery to correct  . Subdural hematoma [facility])     Past Surgical History:  Procedure Laterality Date  . CRANIOTOMY Left 03/05/2017   Procedure: LEFT BURR HOLE  HEMATOMA EVACUATION SUBDURAL;  Surgeon: Kary Kos, MD;  Location: [place];  Service: Neurosurgery;  Laterality: Left;  Marland Kitchen GASTRIC BYPASS  2005  . orif arm    . TONSILLECTOMY     Current Outpatient  Medications on File Prior to Visit  Medication Sig Dispense Refill  . ALPRAZolam (XANAX) 0.5 MG tablet TAKE 1 TABLET BY MOUTH AT NIGHT FOR ANXIETY 60 tablet 0  . amLODipine (NORVASC) 5 MG tablet TAKE 1 TABLET BY MOUTH EVERY DAY 90 tablet 3  . atorvastatin (LIPITOR) 40 MG tablet TAKE 1 TABLET BY MOUTH EVERY DAY 90 tablet 1  . Blood Glucose Monitoring Suppl (ONETOUCH VERIO) w/Device KIT Check BS BID (E11.9) 1 kit 2  . losartan (COZAAR) 100 MG tablet TAKE 1 TABLET BY MOUTH EVERY DAY 90 tablet 3  . meclizine (ANTIVERT) 25 MG tablet Take 1 tablet (25 mg total) by mouth 3 (three) times daily as needed for dizziness. 30 tablet 0  . Melatonin 5 MG TABS Take by mouth.    Glory Rosebush VERIO test strip TEST TWICE A DAY 200 strip 3  . pantoprazole (PROTONIX) 40 MG tablet Take 1 tablet (40 mg total) by mouth 2 (two) times daily before a meal. 180 tablet 3  . sitaGLIPtin (JANUVIA) 100 MG tablet TAKE 1 TABLET BY MOUTH EVERY DAY 90 tablet 2  . traMADol (ULTRAM) 50 MG tablet Take 1 tablet (50 mg total) by mouth 3 (three) times daily as needed. 616 tablet 0  . TRULICITY 1.5 OH/7.2BM SOPN INJECT 0.5 MLS INTO THE SKIN ONCE A  WEEK 4 mL 2  . acetaminophen (TYLENOL) 325 MG tablet Take 1-2 tablets (325-650 mg total) by mouth every 4 (four) hours as needed for mild pain.    Marland Kitchen diclofenac (VOLTAREN) 75 MG EC tablet TAKE 1 TABLET BY MOUTH TWICE A DAY (Patient not taking: Reported on 01/28/2020) 180 tablet 3  . traMADol (ULTRAM) 50 MG tablet TAKE 1 TABLET BY MOUTH THREE TIMES A DAY AS NEEDED (Patient not taking: Reported on 07/27/2020) 180 tablet 0   No current facility-administered medications on file prior to visit.   Allergies  Allergen Reactions  . Crestor [Rosuvastatin Calcium] Other (See Comments)    Mylgia  . Lipitor [Atorvastatin] Other (See Comments)    Mylagia   Social History   Socioeconomic History  . Marital status: Single    Spouse name: Not on file  . Number of children: Not on file  . Years of  education: Not on file  . Highest education level: Not on file  Occupational History  . Not on file  Tobacco Use  . Smoking status: Never Smoker  . Smokeless tobacco: Never Used  Vaping Use  . Vaping Use: Never used  Substance and Sexual Activity  . Alcohol use: No  . Drug use: No  . Sexual activity: Not on file  Other Topics Concern  . Not on file  Social History Narrative  . Not on file   Social Determinants of Health   Financial Resource Strain:   . Difficulty of Paying Living Expenses: Not on file  Food Insecurity:   . Worried About Charity fundraiser in the Last Year: Not on file  . Ran Out of Food in the Last Year: Not on file  Transportation Needs:   . Lack of Transportation (Medical): Not on file  . Lack of Transportation (Non-Medical): Not on file  Physical Activity:   . Days of Exercise per Week: Not on file  . Minutes of Exercise per Session: Not on file  Stress:   . Feeling of Stress : Not on file  Social Connections:   . Frequency of Communication with Friends and Family: Not on file  . Frequency of Social Gatherings with Friends and Family: Not on file  . Attends Religious Services: Not on file  . Active Member of Clubs or Organizations: Not on file  . Attends Archivist Meetings: Not on file  . Marital Status: Not on file  Intimate Partner Violence:   . Fear of Current or Ex-Partner: Not on file  . Emotionally Abused: Not on file  . Physically Abused: Not on file  . Sexually Abused: Not on file      Review of Systems  Neurological: Positive for dizziness.  All other systems reviewed and are negative.      Objective:   Physical Exam Vitals reviewed.  Constitutional:      General: She is not in acute distress.    Appearance: Normal appearance. She is normal weight. She is not ill-appearing or toxic-appearing.  Cardiovascular:     Rate and Rhythm: Normal rate and regular rhythm.     Heart sounds: Normal heart sounds.  Pulmonary:       Effort: Pulmonary effort is normal.     Breath sounds: Normal breath sounds.  Neurological:     General: No focal deficit present.     Mental Status: She is alert and oriented to person, place, and time.     Cranial Nerves: Cranial nerves are intact.  Sensory: Sensation is intact.     Motor: Motor function is intact. No weakness, tremor, atrophy, seizure activity or pronator drift.     Coordination: Coordination is intact.     Gait: Gait is intact.           Assessment & Plan:  Benign paroxysmal positional vertigo of right ear - Plan: Ambulatory referral to Physical Therapy History and exam are consistent with benign paroxysmal positional vertigo.  She has tried and failed meclizine.  Begin scopolamine patches applied every 72 hours as needed for vertigo.  Consult physical therapy for Epley maneuvers.  Recheck if no better in a few weeks or sooner immediately if worsening or new symptoms develop

## 2020-07-28 ENCOUNTER — Ambulatory Visit: Payer: BC Managed Care – PPO | Admitting: Family Medicine

## 2020-08-01 ENCOUNTER — Other Ambulatory Visit: Payer: Self-pay

## 2020-08-01 ENCOUNTER — Ambulatory Visit: Payer: BC Managed Care – PPO | Attending: Family Medicine | Admitting: Physical Therapy

## 2020-08-01 ENCOUNTER — Encounter: Payer: Self-pay | Admitting: Physical Therapy

## 2020-08-01 DIAGNOSIS — R2681 Unsteadiness on feet: Secondary | ICD-10-CM

## 2020-08-01 DIAGNOSIS — H8111 Benign paroxysmal vertigo, right ear: Secondary | ICD-10-CM | POA: Diagnosis present

## 2020-08-01 NOTE — Patient Instructions (Signed)
Benign Positional Vertigo Vertigo is the feeling that you or your surroundings are moving when they are not. Benign positional vertigo is the most common form of vertigo. This is usually a harmless condition (benign). This condition is positional. This means that symptoms are triggered by certain movements and positions. This condition can be dangerous if it occurs while you are doing something that could cause harm to you or others. This includes activities such as driving or operating machinery. What are the causes? In many cases, the cause of this condition is not known. It may be caused by a disturbance in an area of the inner ear that helps your brain to sense movement and balance. This disturbance can be caused by:  Viral infection (labyrinthitis).  Head injury.  Repetitive motion, such as jumping, dancing, or running. What increases the risk? You are more likely to develop this condition if:  You are a woman.  You are 28 years of age or older. What are the signs or symptoms? Symptoms of this condition usually happen when you move your head or your eyes in different directions. Symptoms may start suddenly, and usually last for less than a minute. They include:  Loss of balance and falling.  Feeling like you are spinning or moving.  Feeling like your surroundings are spinning or moving.  Nausea and vomiting.  Blurred vision.  Dizziness.  Involuntary eye movement (nystagmus). Symptoms can be mild and cause only minor problems, or they can be severe and interfere with daily life. Episodes of benign positional vertigo may return (recur) over time. Symptoms may improve over time. How is this diagnosed? This condition may be diagnosed based on:  Your medical history.  Physical exam of the head, neck, and ears.  Tests, such as: ? MRI. ? CT scan. ? Eye movement tests. Your health care provider may ask you to change positions quickly while he or she watches you for  symptoms of benign positional vertigo, such as nystagmus. Eye movement may be tested with a variety of exams that are designed to evaluate or stimulate vertigo. ? An electroencephalogram (EEG). This records electrical activity in your brain. ? Hearing tests. You may be referred to a health care provider who specializes in ear, nose, and throat (ENT) problems (otolaryngologist) or a provider who specializes in disorders of the nervous system (neurologist). How is this treated?  This condition may be treated in a session in which your health care provider moves your head in specific positions to adjust your inner ear back to normal. Treatment for this condition may take several sessions. Surgery may be needed in severe cases, but this is rare. In some cases, benign positional vertigo may resolve on its own in 2-4 weeks. Follow these instructions at home: Safety  Move slowly. Avoid sudden body or head movements or certain positions, as told by your health care provider.  Avoid driving until your health care provider says it is safe for you to do so.  Avoid operating heavy machinery until your health care provider says it is safe for you to do so.  Avoid doing any tasks that would be dangerous to you or others if vertigo occurs.  If you have trouble walking or keeping your balance, try using a cane for stability. If you feel dizzy or unstable, sit down right away.  Return to your normal activities as told by your health care provider. Ask your health care provider what activities are safe for you. General  instructions  Take over-the-counter and prescription medicines only as told by your health care provider.  Drink enough fluid to keep your urine pale yellow.  Keep all follow-up visits as told by your health care provider. This is important. Contact a health care provider if:  You have a fever.  Your condition gets worse or you develop new symptoms.  Your family or friends notice any  behavioral changes.  You have nausea or vomiting that gets worse.  You have numbness or a "pins and needles" sensation. Get help right away if you:  Have difficulty speaking or moving.  Are always dizzy.  Faint.  Develop severe headaches.  Have weakness in your legs or arms.  Have changes in your hearing or vision.  Develop a stiff neck.  Develop sensitivity to light. Summary  Vertigo is the feeling that you or your surroundings are moving when they are not. Benign positional vertigo is the most common form of vertigo.  The cause of this condition is not known. It may be caused by a disturbance in an area of the inner ear that helps your brain to sense movement and balance.  Symptoms include loss of balance and falling, feeling that you or your surroundings are moving, nausea and vomiting, and blurred vision.  This condition can be diagnosed based on symptoms, physical exam, and other tests, such as MRI, CT scan, eye movement tests, and hearing tests.  Follow safety instructions as told by your health care provider. You will also be told when to contact your health care provider in case of problems. This information is not intended to replace advice given to you by your health care provider. Make sure you discuss any questions you have with your health care provider. Document Revised: 03/04/2018 Document Reviewed: 03/04/2018 Elsevier Patient Education  [place].    How to Perform the Epley Maneuver The Epley maneuver is an exercise that relieves symptoms of vertigo. Vertigo is the feeling that you or your surroundings are moving when they are not. When you feel vertigo, you may feel like the room is spinning and have trouble walking. Dizziness is a little different than vertigo. When you are dizzy, you may feel unsteady or light-headed. You can do this maneuver at home whenever you have symptoms of vertigo. You can do it up to 3 times a day until your symptoms go  away. Even though the Epley maneuver may relieve your vertigo for a few weeks, it is possible that your symptoms will return. This maneuver relieves vertigo, but it does not relieve dizziness. What are the risks? If it is done correctly, the Epley maneuver is considered safe. Sometimes it can lead to dizziness or nausea that goes away after a short time. If you develop other symptoms, such as changes in vision, weakness, or numbness, stop doing the maneuver and call your health care provider. How to perform the Epley maneuver 1. Sit on the edge of a bed or table with your back straight and your legs extended or hanging over the edge of the bed or table. 2. Turn your head halfway toward the affected ear or side. 3. Lie backward quickly with your head turned until you are lying flat on your back. You may want to position a pillow under your shoulders. 4. Hold this position for 30 seconds. You may experience an attack of vertigo. This is normal. 5. Turn your head to the opposite direction until your unaffected ear is facing the floor. 6. Hold  this position for 30 seconds. You may experience an attack of vertigo. This is normal. Hold this position until the vertigo stops. 7. Turn your whole body to the same side as your head. Hold for another 30 seconds. 8. Sit back up. You can repeat this exercise up to 3 times a day. Follow these instructions at home:  After doing the Epley maneuver, you can return to your normal activities.  Ask your health care provider if there is anything you should do at home to prevent vertigo. He or she may recommend that you: ? Keep your head raised (elevated) with two or more pillows while you sleep. ? Do not sleep on the side of your affected ear. ? Get up slowly from bed. ? Avoid sudden movements during the day. ? Avoid extreme head movement, like looking up or bending over. Contact a health care provider if:  Your vertigo gets worse.  You have other symptoms,  including: ? Nausea. ? Vomiting. ? Headache. Get help right away if:  You have vision changes.  You have a severe or worsening headache or neck pain.  You cannot stop vomiting.  You have new numbness or weakness in any part of your body. Summary  Vertigo is the feeling that you or your surroundings are moving when they are not.  The Epley maneuver is an exercise that relieves symptoms of vertigo.  If the Epley maneuver is done correctly, it is considered safe. You can do it up to 3 times a day. This information is not intended to replace advice given to you by your health care provider. Make sure you discuss any questions you have with your health care provider. Document Revised: 09/05/2017 Document Reviewed: 08/13/2016 Elsevier Patient Education  [place] for Right Posterior / Anterior Canalithiasis    Sitting on bed: 1. Turn head 45 right. (a) Lie back slowly, shoulders on pillow, head on bed. (b) Hold __20__ seconds. 2. Keeping head on bed, turn head 90 left. Hold _20___ seconds. 3. Roll to left, head on 45 angle down toward bed. Hold _20__ seconds. 4. Sit up on left side of bed. Repeat _3___ times per session. Do _2___ sessions per day.  Copyright  [employer]HI. All rights reserved.

## 2020-08-02 ENCOUNTER — Encounter: Payer: Self-pay | Admitting: Physical Therapy

## 2020-08-02 NOTE — Therapy (Signed)
[facility] [address] [place], [place], [address] Phone: [phone]   Fax:  [phone]  Physical Therapy Evaluation  Patient Details  Name: Tracy Price MRN: [id number] Date of Birth: [birth year] Referring Provider (PT): Dr. Jenna Luo   Encounter Date: 08/01/2020   PT End of Session - 08/02/20 1902    Visit Number 1    Number of Visits 4    Date for PT Re-Evaluation 09/01/20    Authorization Type BCBS    PT Start Time 0800    PT Stop Time 0847    PT Time Calculation (min) 47 min    Activity Tolerance Patient tolerated treatment well    Behavior During Therapy [facility] for tasks assessed/performed           Past Medical History:  Diagnosis Date  . Allergy   . Chest pain   . CKD (chronic kidney disease) stage 3, GFR 30-59 ml/min (HCC)   . Diabetes mellitus without complication ([place])    type 2  . Hyperlipidemia   . Hypertension   . Sleep apnea 1995   had surgery to correct  . Subdural hematoma [facility])     Past Surgical History:  Procedure Laterality Date  . CRANIOTOMY Left 03/05/2017   Procedure: LEFT BURR HOLE  HEMATOMA EVACUATION SUBDURAL;  Surgeon: Kary Kos, MD;  Location: [place];  Service: Neurosurgery;  Laterality: Left;  Marland Kitchen GASTRIC BYPASS  2005  . orif arm    . TONSILLECTOMY      There were no vitals filed for this visit.        [facility] PT Assessment - 08/02/20 0001      Assessment   Medical Diagnosis BPPV    Referring Provider (PT) Dr. Jenna Luo    Onset Date/Surgical Date 07/11/20      Precautions   Precautions Other (comment)   vertigo     Balance Screen   Has the patient fallen in the past 6 months No    Has the patient had a decrease in activity level because of a fear of falling?  No    Is the patient reluctant to leave their home because of a fear of falling?  No      Prior Function   Level of Independence Independent      Observation/Other Assessments   Focus on  Therapeutic Outcomes (FOTO)  pt's FS measure 51/100; risk adjusted 66/100    Other Surveys  Dizziness Handicap Inventory [facility])    Dizziness Handicap Inventory (DHI)  62% (severe handicap group)                  Vestibular Assessment - 08/02/20 0001      Symptom Behavior   Subjective history of current problem episode started on 07-11-20    Type of Dizziness  Spinning    Frequency of Dizziness daily    Duration of Dizziness secs to minutes    Symptom Nature Positional    Aggravating Factors Sitting with head tilted back;Forward bending;Rolling to right    Relieving Factors Head stationary;Lying supine    Progression of Symptoms Better    History of similar episodes none      Positional Testing   Dix-Hallpike Dix-Hallpike Right;Dix-Hallpike Left      Dix-Hallpike Right   Dix-Hallpike Right Duration approx. 50 secs    Dix-Hallpike Right Symptoms Upbeat, right rotatory nystagmus      Dix-Hallpike Left   Dix-Hallpike Left Duration none    Dix-Hallpike  Left Symptoms No nystagmus              Objective measurements completed on examination: See above findings.     Epley maneuver for Rt BPPV - posterior canalithiasis- performed 5 reps; improvement noted on reps 4 and 5 with less nystagmus And less c/o vertigo, indicative of resolution          PT Education - 08/02/20 1900    Education Details pt educated in [employer] for Rt BPPV for self treatment prn; gave info on etiology of BPPV    Person(s) Educated Patient    Methods Explanation;Demonstration;Handout    Comprehension Verbalized understanding;Returned demonstration               PT Long Term Goals - 08/02/20 1909      PT LONG TERM GOAL #1   Title Pt will have a (-) Rt Dix-Hallpike test to indicate resolution of Rt BPPV.    Time 4    Period Weeks    Status New    Target Date 09/01/20      PT LONG TERM GOAL #2   Title Independent in HEP for habituation of dizziness.    Time 4    Period Weeks     Status New    Target Date 09/01/20      PT LONG TERM GOAL #3   Title Improve DHI score from 62% to </= 30% to demo improvement in dizziness.    Baseline 62% on 08-01-20    Time 4    Period Weeks    Status New    Target Date 09/01/20                  Plan - 08/02/20 1903    Clinical Impression Statement Pt has (+) Rt Dix-Hallpike test with c/o vertigo and upbeating nystagmus, indicative of Rt BPPV posterior canalithiasis.  Symptoms improved on 4th rep of Epley for Rt BPPV.  Cont with POC.    Personal Factors and Comorbidities Comorbidity 2;Comorbidity 1    Comorbidities h/o SDH with craniotomy in May 2018; DM type 2    Examination-Activity Limitations Locomotion Level;Bed Mobility;Transfers;Squat;Reach Overhead    Examination-Participation Restrictions Cleaning;Community Activity;Shop;Meal Prep    Stability/Clinical Decision Making Stable/Uncomplicated    Clinical Decision Making Low    Rehab Potential Good    PT Frequency 1x / week    PT Duration 4 weeks    PT Treatment/Interventions Vestibular;Canalith Repostioning;ADLs/Self Care Home Management;Patient/family education;Balance training;Neuromuscular re-education;Therapeutic exercise    PT Next Visit Plan recheck Rt BPPV    PT Home Exercise Plan Epley and Brandt-Daroff    Consulted and Agree with Plan of Care Patient           Patient will benefit from skilled therapeutic intervention in order to improve the following deficits and impairments:  Dizziness, Decreased balance  Visit Diagnosis: BPPV (benign paroxysmal positional vertigo), right  Unsteadiness on feet     Problem List Patient Active Problem List   Diagnosis Date Noted  . Chronic right shoulder pain   . Diabetes mellitus type 2 in obese ([place])   . Neurocognitive deficits   . Acute lower UTI   . Hypoalbuminemia due to protein-calorie malnutrition ([place])   . Subdural hemorrhage following injury ([employer]) 03/10/2017  . Vascular headache   . Ataxia    . Benign essential HTN   . Acute blood loss anemia   . Stage 3 chronic kidney disease ([place])   . Leukocytosis   . Diabetes mellitus  type 2 in nonobese ([place])   . OSA (obstructive sleep apnea)   . Major depressive disorder with single episode   . Constipation   . Seizure prophylaxis   . SDH (subdural hematoma) ([place]) 03/05/2017  . Cerebral aneurysm 03/05/2017  . Family history of heart disease 02/25/2013  . Chest pain 02/25/2013  . Diabetes mellitus without complication ([place])   . Hyperlipidemia   . Hypertension     Vantasia Pinkney, [place], PT 08/02/2020, 7:13 PM  [facility] [address] [place], [place], [address] Phone: [phone]   Fax:  [phone]  Name: Tracy Price MRN: [id number] Date of Birth: 01-02-1956

## 2020-08-07 ENCOUNTER — Other Ambulatory Visit: Payer: Self-pay | Admitting: Family Medicine

## 2020-08-08 ENCOUNTER — Ambulatory Visit: Payer: BC Managed Care – PPO | Attending: Family Medicine | Admitting: Physical Therapy

## 2020-08-08 ENCOUNTER — Other Ambulatory Visit: Payer: Self-pay

## 2020-08-08 DIAGNOSIS — H8111 Benign paroxysmal vertigo, right ear: Secondary | ICD-10-CM | POA: Insufficient documentation

## 2020-08-09 ENCOUNTER — Encounter: Payer: Self-pay | Admitting: Physical Therapy

## 2020-08-09 NOTE — Therapy (Signed)
[facility] [address] [place], [place], [address] Phone: [phone]   Fax:  [phone]  Physical Therapy Treatment  Patient Details  Name: Tracy Price MRN: [id number] Date of Birth: 1956/01/26 Referring Provider (PT): Dr. Jenna Luo   Encounter Date: 08/08/2020   PT End of Session - 08/09/20 1459    Visit Number 2    Number of Visits 4    Date for PT Re-Evaluation 09/01/20    Authorization Type BCBS    PT Start Time 0802    PT Stop Time 0846    PT Time Calculation (min) 44 min    Activity Tolerance Patient tolerated treatment well    Behavior During Therapy [facility] for tasks assessed/performed           Past Medical History:  Diagnosis Date  . Allergy   . Chest pain   . CKD (chronic kidney disease) stage 3, GFR 30-59 ml/min (HCC)   . Diabetes mellitus without complication ([place])    type 2  . Hyperlipidemia   . Hypertension   . Sleep apnea 1995   had surgery to correct  . Subdural hematoma [facility])     Past Surgical History:  Procedure Laterality Date  . CRANIOTOMY Left 03/05/2017   Procedure: LEFT BURR HOLE  HEMATOMA EVACUATION SUBDURAL;  Surgeon: Kary Kos, MD;  Location: [facility];  Service: Neurosurgery;  Laterality: Left;  Marland Kitchen GASTRIC BYPASS  2005  . orif arm    . TONSILLECTOMY      There were no vitals filed for this visit.   Subjective Assessment - 08/08/20 0816    Subjective Pt states the vertigo is much better but is still there - gets dizzy when she looks up    Pertinent History h/o SDH, DM type 2    Patient Stated Goals resolve the vertigo    Currently in Pain? No/denies                   Vestibular Assessment - 08/09/20 0001      Positional Testing   Dix-Hallpike Dix-Hallpike Right;Dix-Hallpike Left      Dix-Hallpike Right   Dix-Hallpike Right Duration approx. 90 secs til nystagmus completely stopped    Dix-Hallpike Right Symptoms Upbeat, right rotatory nystagmus       Dix-Hallpike Left   Dix-Hallpike Left Duration none    Dix-Hallpike Left Symptoms No nystagmus                     Vestibular Treatment/Exercise - 08/09/20 0001      Vestibular Treatment/Exercise   Vestibular Treatment Provided Canalith Repositioning    Canalith Repositioning Epley Manuever Right;Semont Procedure Right Posterior    Habituation Exercises Brandt Daroff       EPLEY MANUEVER RIGHT   Number of Reps  3    Overall Response Improved Symptoms    Response Details  nystagmus duration decreased from 90 secs initially to 25 secs with beats intermittently    on 3rd rep - returned to upright sitting fr. initial positio     Semont Procedure Right Posterior   Number of Reps  3    Overall Response  Improved Symptoms      Nestor Lewandowsky   Number of Reps  3   sit to Rt sidelying only   Symptom Description  pt has imbalance and increased dizziness with sidelying to sitting  PT Education - 08/09/20 1458    Education Details instructed pt in Semont maneuver for Rt BPPV canalithiasis; sit to Rt sidelying only for habituation of dizziness with return to upright    Person(s) Educated Patient    Methods Explanation;Demonstration;Handout    Comprehension Verbalized understanding;Returned demonstration               PT Long Term Goals - 08/09/20 1502      PT LONG TERM GOAL #1   Title Pt will have a (-) Rt Dix-Hallpike test to indicate resolution of Rt BPPV.    Time 4    Period Weeks    Status New      PT LONG TERM GOAL #2   Title Independent in HEP for habituation of dizziness.    Time 4    Period Weeks    Status New      PT LONG TERM GOAL #3   Title Improve DHI score from 62% to </= 30% to demo improvement in dizziness.    Baseline 62% on 08-01-20    Time 4    Period Weeks    Status New                 Plan - 08/09/20 1500    Clinical Impression Statement Pt's symptoms much improved after 3 reps of Epley and 3 reps of Semont  maneuver for Rt BPPV posterior canalithiasis.  Minimal nystagmus noted on 3rd rep of Epley, after having performed 3 reps of Semont maneuver.  Cont with POC.    Personal Factors and Comorbidities Comorbidity 2;Comorbidity 1    Comorbidities h/o SDH with craniotomy in May 2018; DM type 2    Examination-Activity Limitations Locomotion Level;Bed Mobility;Transfers;Squat;Reach Overhead    Examination-Participation Restrictions Cleaning;Community Activity;Shop;Meal Prep    Stability/Clinical Decision Making Stable/Uncomplicated    Rehab Potential Good    PT Frequency 1x / week    PT Duration 4 weeks    PT Treatment/Interventions Vestibular;Canalith Repostioning;ADLs/Self Care Home Management;Patient/family education;Balance training;Neuromuscular re-education;Therapeutic exercise    PT Next Visit Plan recheck Rt BPPV - semont vs Epley    PT Home Exercise Plan Epley and Brandt-Daroff    Consulted and Agree with Plan of Care Patient           Patient will benefit from skilled therapeutic intervention in order to improve the following deficits and impairments:  Dizziness, Decreased balance  Visit Diagnosis: BPPV (benign paroxysmal positional vertigo), right     Problem List Patient Active Problem List   Diagnosis Date Noted  . Chronic right shoulder pain   . Diabetes mellitus type 2 in obese ([place])   . Neurocognitive deficits   . Acute lower UTI   . Hypoalbuminemia due to protein-calorie malnutrition ([place])   . Subdural hemorrhage following injury ([place]) 03/10/2017  . Vascular headache   . Ataxia   . Benign essential HTN   . Acute blood loss anemia   . Stage 3 chronic kidney disease ([place])   . Leukocytosis   . Diabetes mellitus type 2 in nonobese (HCC)   . OSA (obstructive sleep apnea)   . Major depressive disorder with single episode   . Constipation   . Seizure prophylaxis   . SDH (subdural hematoma) ([place]) 03/05/2017  . Cerebral aneurysm 03/05/2017  . Family history of heart  disease 02/25/2013  . Chest pain 02/25/2013  . Diabetes mellitus without complication ([place])   . Hyperlipidemia   . Hypertension     Tracy Price, [place], PT  08/09/2020, 3:03 PM  [facility] [address] [place], [place], [address] Phone: [phone]   Fax:  [phone]  Name: Jacquese Cassarino MRN: [id number] Date of Birth: 1956-01-15

## 2020-08-10 ENCOUNTER — Encounter: Payer: Self-pay | Admitting: Family Medicine

## 2020-08-10 ENCOUNTER — Other Ambulatory Visit: Payer: Self-pay | Admitting: Family Medicine

## 2020-08-10 MED ORDER — TRULICITY 1.5 MG/0.5ML ~~LOC~~ SOAJ: SUBCUTANEOUS | 11 refills | Status: DC

## 2020-08-12 ENCOUNTER — Other Ambulatory Visit: Payer: Self-pay | Admitting: Family Medicine

## 2020-08-15 ENCOUNTER — Ambulatory Visit: Payer: BC Managed Care – PPO | Admitting: Physical Therapy

## 2020-08-22 ENCOUNTER — Ambulatory Visit: Payer: BC Managed Care – PPO | Admitting: Physical Therapy

## 2020-08-22 ENCOUNTER — Other Ambulatory Visit: Payer: Self-pay

## 2020-08-22 VITALS — BP 135/78 | HR 86

## 2020-08-22 DIAGNOSIS — H8111 Benign paroxysmal vertigo, right ear: Secondary | ICD-10-CM | POA: Diagnosis not present

## 2020-08-23 ENCOUNTER — Encounter: Payer: Self-pay | Admitting: Physical Therapy

## 2020-08-23 NOTE — Therapy (Signed)
[facility] [address] [place], [place], [address] Phone: [phone]   Fax:  [phone]  Physical Therapy Treatment & Discharge Summary  Patient Details  Name: Tracy Price MRN: [id number] Date of Birth: 14-Oct-1955 Referring Provider (PT): Dr. Jenna Luo   Encounter Date: 08/22/2020   PT End of Session - 08/23/20 1811    Visit Number 3    Number of Visits 4    Date for PT Re-Evaluation 09/01/20    Authorization Type BCBS    PT Start Time [phone]    PT Stop Time 0930    PT Time Calculation (min) 43 min    Activity Tolerance Patient tolerated treatment well    Behavior During Therapy [facility] for tasks assessed/performed           Past Medical History:  Diagnosis Date  . Allergy   . Chest pain   . CKD (chronic kidney disease) stage 3, GFR 30-59 ml/min (HCC)   . Diabetes mellitus without complication ([place])    type 2  . Hyperlipidemia   . Hypertension   . Sleep apnea 1995   had surgery to correct  . Subdural hematoma [facility])     Past Surgical History:  Procedure Laterality Date  . CRANIOTOMY Left 03/05/2017   Procedure: LEFT BURR HOLE  HEMATOMA EVACUATION SUBDURAL;  Surgeon: Kary Kos, MD;  Location: [facility];  Service: Neurosurgery;  Laterality: Left;  Marland Kitchen GASTRIC BYPASS  2005  . orif arm    . TONSILLECTOMY      Vitals:   08/22/20 0858  BP: 135/78  Pulse: 86              Vestibular Assessment - 08/23/20 0001      Positional Testing   Dix-Hallpike Dix-Hallpike Right;Dix-Hallpike Left    Sidelying Test Sidelying Right;Sidelying Left      Dix-Hallpike Right   Dix-Hallpike Right Duration none    Dix-Hallpike Right Symptoms No nystagmus   light-headedness with return to upright     Dix-Hallpike Left   Dix-Hallpike Left Duration none    Dix-Hallpike Left Symptoms No nystagmus      Sidelying Right   Sidelying Right Duration none    Sidelying Right Symptoms No nystagmus   c/o  light-headedness with return to upright     Sidelying Left   Sidelying Left Duration none     Sidelying Left Symptoms No nystagmus   c/o light-headedness with return to upright     Orthostatics   BP supine (x 5 minutes) 146/83    HR supine (x 5 minutes) 82    BP sitting 131/77    HR sitting 84    BP standing (after 1 minute) 127/78    HR standing (after 1 minute) 89    BP standing (after 3 minutes) 116/77    HR standing (after 3 minutes) 90    Orthostatics Comment pt reports most intense light-headedness occurred with supine to sit                                  PT Long Term Goals - 08/22/20 0931      PT LONG TERM GOAL #1   Title Pt will have a (-) Rt Dix-Hallpike test to indicate resolution of Rt BPPV.    Baseline met 08-22-20    Time 4    Period Weeks    Status Achieved  PT LONG TERM GOAL #2   Title Independent in HEP for habituation of dizziness.    Time 4    Period Weeks    Status Achieved      PT LONG TERM GOAL #3   Title Improve DHI score from 62% to </= 30% to demo improvement in dizziness.    Baseline 62% on 08-01-20    Time 4    Period Weeks    Status Achieved                  Patient will benefit from skilled therapeutic intervention in order to improve the following deficits and impairments:     Visit Diagnosis: BPPV (benign paroxysmal positional vertigo), right     Problem List Patient Active Problem List   Diagnosis Date Noted  . Chronic right shoulder pain   . Diabetes mellitus type 2 in obese ([place])   . Neurocognitive deficits   . Acute lower UTI   . Hypoalbuminemia due to protein-calorie malnutrition ([place])   . Subdural hemorrhage following injury ([place]) 03/10/2017  . Vascular headache   . Ataxia   . Benign essential HTN   . Acute blood loss anemia   . Stage 3 chronic kidney disease ([place])   . Leukocytosis   . Diabetes mellitus type 2 in nonobese (HCC)   . OSA (obstructive sleep apnea)   . Major  depressive disorder with single episode   . Constipation   . Seizure prophylaxis   . SDH (subdural hematoma) ([place]) 03/05/2017  . Cerebral aneurysm 03/05/2017  . Family history of heart disease 02/25/2013  . Chest pain 02/25/2013  . Diabetes mellitus without complication ([place])   . Hyperlipidemia   . Hypertension       PHYSICAL THERAPY DISCHARGE SUMMARY  Visits from Start of Care: 3  Current functional level related to goals / functional outcomes: See above for progress towards goals - all LTG's met:  BPPV has resolved as of this time   Remaining deficits: Pt reports moderate light-headedness with supine to sit transfers and also with bending over and returning to upright standing (see orthostatics)   Education / Equipment: Pt has been instructed in HEP for habituation and self treatment of BPPV should she have re-occurrence in future. Plan: Patient agrees to discharge.  Patient goals were met. Patient is being discharged due to meeting the stated rehab goals.  ?????      Alda Lea, PT 08/23/2020, [facility] [address] [place], [place], [address] Phone: [phone]   Fax:  [phone]  Name: Tracy Price MRN: [id number] Date of Birth: 07-Jul-1956

## 2020-09-09 ENCOUNTER — Encounter: Payer: Self-pay | Admitting: Family Medicine

## 2020-09-11 MED ORDER — TRAMADOL HCL 50 MG PO TABS: 50.0000 mg | ORAL_TABLET | Freq: Three times a day (TID) | ORAL | 1 refills | Status: DC | PRN

## 2020-09-11 NOTE — Telephone Encounter (Signed)
Ok to refill??  Last office visit 07/27/2020.  Last refill 07/24/2020.  Ok to add (1) refill to prescription?

## 2020-09-12 ENCOUNTER — Ambulatory Visit: Payer: BC Managed Care – PPO | Admitting: Family Medicine

## 2020-09-18 ENCOUNTER — Encounter: Payer: Self-pay | Admitting: Family Medicine

## 2020-09-18 ENCOUNTER — Other Ambulatory Visit: Payer: Self-pay | Admitting: Family Medicine

## 2020-09-19 ENCOUNTER — Ambulatory Visit: Payer: BC Managed Care – PPO | Admitting: Family Medicine

## 2020-09-19 NOTE — Telephone Encounter (Signed)
Ok to refill??  Last office visit 07/27/2020.  Last refill 07/10/2020.  Ok to add refills to prescription?

## 2020-09-21 ENCOUNTER — Ambulatory Visit: Payer: BC Managed Care – PPO | Admitting: Family Medicine

## 2020-09-22 ENCOUNTER — Ambulatory Visit: Payer: BC Managed Care – PPO | Admitting: Family Medicine

## 2020-10-03 ENCOUNTER — Other Ambulatory Visit: Payer: Self-pay

## 2020-10-03 ENCOUNTER — Ambulatory Visit (INDEPENDENT_AMBULATORY_CARE_PROVIDER_SITE_OTHER): Payer: BC Managed Care – PPO | Admitting: Family Medicine

## 2020-10-03 VITALS — BP 140/80 | HR 92 | Temp 98.1°F | Ht 61.0 in | Wt 112.0 lb

## 2020-10-03 DIAGNOSIS — Z8679 Personal history of other diseases of the circulatory system: Secondary | ICD-10-CM | POA: Diagnosis not present

## 2020-10-03 DIAGNOSIS — N183 Chronic kidney disease, stage 3 unspecified: Secondary | ICD-10-CM | POA: Diagnosis not present

## 2020-10-03 DIAGNOSIS — E041 Nontoxic single thyroid nodule: Secondary | ICD-10-CM

## 2020-10-03 DIAGNOSIS — E119 Type 2 diabetes mellitus without complications: Secondary | ICD-10-CM

## 2020-10-03 DIAGNOSIS — K219 Gastro-esophageal reflux disease without esophagitis: Secondary | ICD-10-CM

## 2020-10-03 DIAGNOSIS — I10 Essential (primary) hypertension: Secondary | ICD-10-CM

## 2020-10-03 MED ORDER — SUCRALFATE 1 G PO TABS
1.0000 g | ORAL_TABLET | Freq: Three times a day (TID) | ORAL | 0 refills | Status: DC
Start: 2020-10-03 — End: 2020-10-18

## 2020-10-03 NOTE — Progress Notes (Signed)
Subjective:    Patient ID: Tracy Price, female    DOB: [birth year], 64 y.o.   MRN: [id number]  HPI  Patient is a very sweet 64 year old Caucasian female who presents today for a checkup.  However she has 2 main concerns.  She recently had a carotid ultrasound performed by a life screen.  They noticed an abnormality in her thyroid gland.  On today's exam, I believe I feel a nodularity on the left side of her thyroid gland that I have not appreciated before.  I believe that we need to get this checked out.  She is also having worsening heartburn over the last several weeks.  She is on pantoprazole twice daily.  Past medical history is complicated by gastric bypass.  She denies any hematemesis or melena.  However the pantoprazole twice a day is not controlling the reflux and the indigestion.  Raises concern about possible gastritis versus an ulcer.  She has not had an EGD.  She is still occasionally taking Voltaren.  Given her chronic kidney disease I recommended against this.  She is also on the combination of Trulicity and Januvia.  Her last A1c was outstanding at 5.5.  I see no reason for her to be on the combination first because it is redundant and second because her A1c is excellent.  Therefore I have asked her to stop the Januvia immediately.  She does complain of some muscle aches in the Lipitor but she can tolerate it.  Her flu shot and Covid shot are up-to-date  Past Medical History:  Diagnosis Date  . Allergy   . Chest pain   . CKD (chronic kidney disease) stage 3, GFR 30-59 ml/min (HCC)   . Diabetes mellitus without complication ([place])    type 2  . Hyperlipidemia   . Hypertension   . Sleep apnea 1995   had surgery to correct  . Subdural hematoma [facility])     Past Surgical History:  Procedure Laterality Date  . CRANIOTOMY Left 03/05/2017   Procedure: LEFT BURR HOLE  HEMATOMA EVACUATION SUBDURAL;  Surgeon: Kary Kos, MD;  Location: [place];  Service: Neurosurgery;  Laterality: Left;  Marland Kitchen  GASTRIC BYPASS  2005  . orif arm    . TONSILLECTOMY     Current Outpatient Medications on File Prior to Visit  Medication Sig Dispense Refill  . ALPRAZolam (XANAX) 0.5 MG tablet TAKE 1 TABLET BY MOUTH AT NIGHT FOR ANXIETY 30 tablet 3  . amLODipine (NORVASC) 5 MG tablet TAKE 1 TABLET BY MOUTH EVERY DAY 90 tablet 3  . atorvastatin (LIPITOR) 40 MG tablet TAKE 1 TABLET BY MOUTH EVERY DAY 90 tablet 1  . Blood Glucose Monitoring Suppl (ONETOUCH VERIO) w/Device KIT Check BS BID (E11.9) 1 kit 2  . Dulaglutide (TRULICITY) 1.5 BT/5.1VO SOPN INJECT 0.5 MLS INTO THE SKIN ONCE A WEEK 4 mL 11  . losartan (COZAAR) 100 MG tablet TAKE 1 TABLET BY MOUTH EVERY DAY 90 tablet 3  . Melatonin 5 MG TABS Take by mouth.    Glory Rosebush VERIO test strip TEST TWICE A DAY 200 strip 3  . pantoprazole (PROTONIX) 40 MG tablet Take 1 tablet (40 mg total) by mouth 2 (two) times daily before a meal. 180 tablet 3  . sitaGLIPtin (JANUVIA) 100 MG tablet TAKE 1 TABLET BY MOUTH EVERY DAY 90 tablet 2  . traMADol (ULTRAM) 50 MG tablet Take 1 tablet (50 mg total) by mouth 3 (three) times daily as needed. 180 tablet 1  .  acetaminophen (TYLENOL) 325 MG tablet Take 1-2 tablets (325-650 mg total) by mouth every 4 (four) hours as needed for mild pain.    Marland Kitchen diclofenac (VOLTAREN) 75 MG EC tablet TAKE 1 TABLET BY MOUTH TWICE A DAY (Patient not taking: Reported on 10/03/2020) 180 tablet 2  . meclizine (ANTIVERT) 25 MG tablet Take 1 tablet (25 mg total) by mouth 3 (three) times daily as needed for dizziness. (Patient not taking: No sig reported) 30 tablet 0  . scopolamine (TRANSDERM-SCOP, 1.5 MG,) 1 MG/3DAYS Place 1 patch (1.5 mg total) onto the skin every 3 (three) days. (Patient not taking: No sig reported) 10 patch 12   No current facility-administered medications on file prior to visit.   Allergies  Allergen Reactions  . Crestor [Rosuvastatin Calcium] Other (See Comments)    Mylgia  . Lipitor [Atorvastatin] Other (See Comments)     Mylagia   Social History   Socioeconomic History  . Marital status: Single    Spouse name: Not on file  . Number of children: Not on file  . Years of education: Not on file  . Highest education level: Not on file  Occupational History  . Not on file  Tobacco Use  . Smoking status: Never Smoker  . Smokeless tobacco: Never Used  Vaping Use  . Vaping Use: Never used  Substance and Sexual Activity  . Alcohol use: No  . Drug use: No  . Sexual activity: Not on file  Other Topics Concern  . Not on file  Social History Narrative  . Not on file   Social Determinants of Health   Financial Resource Strain: Not on file  Food Insecurity: Not on file  Transportation Needs: Not on file  Physical Activity: Not on file  Stress: Not on file  Social Connections: Not on file  Intimate Partner Violence: Not on file      Review of Systems  All other systems reviewed and are negative.      Objective:   Physical Exam Vitals reviewed.  Constitutional:      General: She is not in acute distress.    Appearance: Normal appearance. She is normal weight. She is not ill-appearing or toxic-appearing.  Cardiovascular:     Rate and Rhythm: Normal rate and regular rhythm.     Heart sounds: Normal heart sounds.  Pulmonary:     Effort: Pulmonary effort is normal.     Breath sounds: Normal breath sounds.           Assessment & Plan:  Stage 3 chronic kidney disease, unspecified whether stage 3a or 3b CKD ([place]) - Plan: Hemoglobin A1c, CBC with Differential/Platelet, COMPLETE METABOLIC PANEL WITH GFR, Lipid panel, Microalbumin/Creatinine Ratio, Urine  Type 2 diabetes mellitus without complication, without long-term current use of insulin (HCC) - Plan: Hemoglobin A1c, CBC with Differential/Platelet, COMPLETE METABOLIC PANEL WITH GFR, Lipid panel, Microalbumin/Creatinine Ratio, Urine  Benign essential HTN - Plan: Hemoglobin A1c, CBC with Differential/Platelet, COMPLETE METABOLIC PANEL WITH  GFR, Lipid panel, Microalbumin/Creatinine Ratio, Urine  History of intracranial hemorrhage  Gastroesophageal reflux disease, unspecified whether esophagitis present  Thyroid nodule incidentally noted on imaging study - Plan: US THYROID  Patient is having worsening heartburn.  I will add sucralfate to her twice daily Protonix.  If not improving after 2 weeks have asked the patient to call me back so that I can get her scheduled to see GI for an EGD to evaluate what the cause is.  If improving on the sucralfate I would  complete a 4-week course.  I do believe I can feel a small nodule on her thyroid gland.  I will arrange the patient to have a thyroid ultrasound to evaluate further.  Blood pressure today is borderline.  Given her chronic kidney disease, I would like to keep her systolic blood pressure in the 130s.  I have asked her to check her blood pressure every day and notify me of the values.  If consistently greater than 140, I would increase losartan.  Check a hemoglobin A1c today.  Discontinue Januvia.  Discontinue Voltaren.  Check a urine microalbumin to creatinine ratio.  If greater than 30, may want to consider switching Trulicity to [place].

## 2020-10-04 ENCOUNTER — Encounter: Payer: Self-pay | Admitting: Family Medicine

## 2020-10-04 LAB — COMPLETE METABOLIC PANEL WITH GFR
AG Ratio: 1.5 (calc) (ref 1.0–2.5)
ALT: 42 U/L — ABNORMAL HIGH (ref 6–29)
AST: 44 U/L — ABNORMAL HIGH (ref 10–35)
Albumin: 4.3 g/dL (ref 3.6–5.1)
Alkaline phosphatase (APISO): 97 U/L (ref 37–153)
BUN/Creatinine Ratio: 17 (calc) (ref 6–22)
BUN: 20 mg/dL (ref 7–25)
CO2: 24 mmol/L (ref 20–32)
Calcium: 9.2 mg/dL (ref 8.6–10.4)
Chloride: 101 mmol/L (ref 98–110)
Creat: 1.18 mg/dL — ABNORMAL HIGH (ref 0.50–0.99)
GFR, Est African American: 57 mL/min/{1.73_m2} — ABNORMAL LOW (ref >=60)
GFR, Est Non African American: 49 mL/min/{1.73_m2} — ABNORMAL LOW (ref >=60)
Globulin: 2.8 g/dL (calc) (ref 1.9–3.7)
Glucose, Bld: 87 mg/dL (ref 65–99)
Potassium: 3.7 mmol/L (ref 3.5–5.3)
Sodium: 135 mmol/L (ref 135–146)
Total Bilirubin: 0.8 mg/dL (ref 0.2–1.2)
Total Protein: 7.1 g/dL (ref 6.1–8.1)

## 2020-10-04 LAB — MICROALBUMIN / CREATININE URINE RATIO
Creatinine, Urine: 13 mg/dL — ABNORMAL LOW (ref 20–275)
Microalb Creat Ratio: 38 mcg/mg creat — ABNORMAL HIGH (ref <30)
Microalb, Ur: 0.5 mg/dL

## 2020-10-04 LAB — HEMOGLOBIN A1C
Hgb A1c MFr Bld: 5.5 % of total Hgb (ref <5.7)
Mean Plasma Glucose: 111 mg/dL
eAG (mmol/L): 6.2 mmol/L

## 2020-10-04 LAB — LIPID PANEL
Cholesterol: 190 mg/dL (ref <200)
HDL: 82 mg/dL (ref >=50)
LDL Cholesterol (Calc): 93 mg/dL (calc)
Non-HDL Cholesterol (Calc): 108 mg/dL (calc) (ref <130)
Total CHOL/HDL Ratio: 2.3 (calc) (ref <5.0)
Triglycerides: 67 mg/dL (ref <150)

## 2020-10-04 LAB — CBC WITH DIFFERENTIAL/PLATELET
Absolute Monocytes: 442 cells/uL (ref 200–950)
Basophils Absolute: 91 cells/uL (ref 0–200)
Basophils Relative: 1.4 %
Eosinophils Absolute: 234 cells/uL (ref 15–500)
Eosinophils Relative: 3.6 %
HCT: 40 % (ref 35.0–45.0)
Hemoglobin: 13.3 g/dL (ref 11.7–15.5)
Lymphs Abs: 1840 cells/uL (ref 850–3900)
MCH: 30.7 pg (ref 27.0–33.0)
MCHC: 33.3 g/dL (ref 32.0–36.0)
MCV: 92.4 fL (ref 80.0–100.0)
MPV: 11.1 fL (ref 7.5–12.5)
Monocytes Relative: 6.8 %
Neutro Abs: 3894 cells/uL (ref 1500–7800)
Neutrophils Relative %: 59.9 %
Platelets: 222 10*3/uL (ref 140–400)
RBC: 4.33 10*6/uL (ref 3.80–5.10)
RDW: 12.4 % (ref 11.0–15.0)
Total Lymphocyte: 28.3 %
WBC: 6.5 10*3/uL (ref 3.8–10.8)

## 2020-10-05 ENCOUNTER — Other Ambulatory Visit: Payer: Self-pay | Admitting: *Deleted

## 2020-10-12 ENCOUNTER — Ambulatory Visit
Admission: RE | Admit: 2020-10-12 | Discharge: 2020-10-12 | Disposition: A | Discharge status: Home / Self Care | Payer: BC Managed Care – PPO | Source: Ambulatory Visit | Attending: Family Medicine | Admitting: Family Medicine

## 2020-10-12 ENCOUNTER — Encounter: Payer: Self-pay | Admitting: Family Medicine

## 2020-10-12 DIAGNOSIS — E041 Nontoxic single thyroid nodule: Secondary | ICD-10-CM

## 2020-10-13 ENCOUNTER — Other Ambulatory Visit: Payer: Self-pay | Admitting: Family Medicine

## 2020-10-13 MED ORDER — HYDROCHLOROTHIAZIDE 12.5 MG PO CAPS: 12.5000 mg | ORAL_CAPSULE | Freq: Every day | ORAL | 3 refills | Status: DC

## 2020-10-17 ENCOUNTER — Encounter: Payer: Self-pay | Admitting: Family Medicine

## 2020-10-17 MED ORDER — HYDROCHLOROTHIAZIDE 12.5 MG PO CAPS: 12.5000 mg | ORAL_CAPSULE | Freq: Every day | ORAL | 3 refills | Status: DC

## 2020-10-18 MED ORDER — SUCRALFATE 1 G PO TABS: 1.0000 g | ORAL_TABLET | Freq: Three times a day (TID) | ORAL | 0 refills | Status: DC

## 2020-10-18 MED ORDER — HYDROCHLOROTHIAZIDE 12.5 MG PO CAPS: 12.5000 mg | ORAL_CAPSULE | Freq: Every day | ORAL | 3 refills | Status: DC

## 2020-10-18 MED ORDER — SUCRALFATE 1 G PO TABS: 1.0000 g | ORAL_TABLET | Freq: Three times a day (TID) | ORAL | 3 refills | Status: DC

## 2020-10-19 ENCOUNTER — Other Ambulatory Visit: Payer: Self-pay | Admitting: Family Medicine

## 2020-10-19 NOTE — Telephone Encounter (Signed)
Can you pls update address for pt. Thank you

## 2020-10-20 ENCOUNTER — Other Ambulatory Visit: Payer: BC Managed Care – PPO

## 2020-12-04 ENCOUNTER — Encounter: Payer: Self-pay | Admitting: Family Medicine

## 2020-12-05 MED ORDER — TRAMADOL HCL 50 MG PO TABS: 50.0000 mg | ORAL_TABLET | Freq: Three times a day (TID) | ORAL | 1 refills | Status: DC | PRN

## 2020-12-05 NOTE — Telephone Encounter (Signed)
Ok to refill??  Last office visit 09/13/2020.  Last refill 09/11/2020, #1 refill.

## 2021-01-15 ENCOUNTER — Encounter: Payer: Self-pay | Admitting: Family Medicine

## 2021-01-18 MED ORDER — VUITY 1.25 % OP SOLN: 1.0000 [drp] | Freq: Every day | OPHTHALMIC | 3 refills | Status: DC

## 2021-01-21 ENCOUNTER — Encounter: Payer: Self-pay | Admitting: Family Medicine

## 2021-01-22 ENCOUNTER — Other Ambulatory Visit: Payer: Self-pay

## 2021-01-22 MED ORDER — ALPRAZOLAM 0.5 MG PO TABS: 0.5000 mg | ORAL_TABLET | Freq: Three times a day (TID) | ORAL | 3 refills | Status: DC | PRN

## 2021-02-12 ENCOUNTER — Ambulatory Visit: Payer: BC Managed Care – PPO | Admitting: Family Medicine

## 2021-02-28 ENCOUNTER — Encounter: Payer: Self-pay | Admitting: Family Medicine

## 2021-02-28 MED ORDER — SUCRALFATE 1 G PO TABS: 1.0000 g | ORAL_TABLET | Freq: Three times a day (TID) | ORAL | 3 refills | Status: DC

## 2021-03-24 ENCOUNTER — Other Ambulatory Visit: Payer: Self-pay | Admitting: Family Medicine

## 2021-03-24 ENCOUNTER — Encounter: Payer: Self-pay | Admitting: Family Medicine

## 2021-03-26 ENCOUNTER — Ambulatory Visit: Payer: BC Managed Care – PPO | Admitting: Family Medicine

## 2021-03-26 ENCOUNTER — Telehealth: Payer: Self-pay | Admitting: *Deleted

## 2021-03-26 MED ORDER — TRAMADOL HCL 50 MG PO TABS: 50.0000 mg | ORAL_TABLET | Freq: Three times a day (TID) | ORAL | 1 refills | Status: DC | PRN

## 2021-03-26 MED ORDER — ATORVASTATIN CALCIUM 40 MG PO TABS: 1.0000 | ORAL_TABLET | Freq: Every day | ORAL | 1 refills | Status: DC

## 2021-03-26 NOTE — Telephone Encounter (Signed)
Ok to refill??  Last office visit 10/03/2020.  Last refill 12/05/2020, #1 refill.

## 2021-03-26 NOTE — Telephone Encounter (Signed)
Received request from pharmacy for PA on Tramadol.   PA submitted.   Dx: O64.314- shoulder pain, G89.4- chronic pain syndrome  Received following determination: Your PA request has been closed. no pa required , paid claim on file - BB, Intake Agent,Prior Auth 03/26/2021 03:24 PM

## 2021-03-27 ENCOUNTER — Other Ambulatory Visit: Payer: Self-pay

## 2021-03-27 MED ORDER — LOSARTAN POTASSIUM 100 MG PO TABS: 1.0000 | ORAL_TABLET | Freq: Every day | ORAL | 3 refills | Status: DC

## 2021-04-12 ENCOUNTER — Other Ambulatory Visit: Payer: Self-pay

## 2021-04-12 ENCOUNTER — Encounter: Payer: Self-pay | Admitting: Family Medicine

## 2021-04-12 ENCOUNTER — Ambulatory Visit: Payer: BC Managed Care – PPO | Admitting: Family Medicine

## 2021-04-12 VITALS — BP 124/66 | HR 84 | Temp 97.9°F | Resp 14 | Ht 61.0 in | Wt 130.0 lb

## 2021-04-12 DIAGNOSIS — M25512 Pain in left shoulder: Secondary | ICD-10-CM

## 2021-04-12 DIAGNOSIS — E119 Type 2 diabetes mellitus without complications: Secondary | ICD-10-CM | POA: Diagnosis not present

## 2021-04-12 NOTE — Progress Notes (Signed)
Subjective:    Patient ID: Tracy Price, female    DOB: Oct 03, 1956, 65 y.o.   MRN: [id number]  HPI  Patient is a very sweet 65 year old lady here for follow up of her diabetes.  Also requesting cortisone shot in her right shoulder.  Reports pain in shoulder with abduction greater than 90 degrees.  Aches at night.  Hurst to sleep on the shoulder.  Pain with ROM.  Not checking her sugars.  Denies hypoglycemic episodes.  Denies polyuria, polydypsia or blurry vision.  BP good at 124/66  Past Medical History:  Diagnosis Date   Allergy    Chest pain    CKD (chronic kidney disease) stage 3, GFR 30-59 ml/min (HCC)    Diabetes mellitus without complication ([place])    type 2   Hyperlipidemia    Hypertension    Sleep apnea 1995   had surgery to correct   Subdural hematoma [facility])     Past Surgical History:  Procedure Laterality Date   CRANIOTOMY Left 03/05/2017   Procedure: LEFT BURR HOLE  HEMATOMA EVACUATION SUBDURAL;  Surgeon: Kary Kos, MD;  Location: [place];  Service: Neurosurgery;  Laterality: Left;   GASTRIC BYPASS  2005   orif arm     TONSILLECTOMY     Current Outpatient Medications on File Prior to Visit  Medication Sig Dispense Refill   ALPRAZolam (XANAX) 0.5 MG tablet TAKE 1 TABLET BY MOUTH 3 TIMES DAILY AS NEEDED FOR ANXIETY. 30 tablet 3   amLODipine (NORVASC) 5 MG tablet TAKE 1 TABLET BY MOUTH EVERY DAY 90 tablet 3   atorvastatin (LIPITOR) 40 MG tablet Take 1 tablet (40 mg total) by mouth daily. 90 tablet 1   Blood Glucose Monitoring Suppl (ONETOUCH VERIO) w/Device KIT Check BS BID (E11.9) 1 kit 2   hydrochlorothiazide (MICROZIDE) 12.5 MG capsule Take 1 capsule (12.5 mg total) by mouth daily. 90 capsule 3   losartan (COZAAR) 100 MG tablet Take 1 tablet (100 mg total) by mouth daily. 90 tablet 3   Melatonin 5 MG TABS Take by mouth.     ONETOUCH VERIO test strip TEST TWICE A DAY 200 strip 3   pantoprazole (PROTONIX) 40 MG tablet TAKE 1 TABLET BY MOUTH TWICE A DAY BEFORE A MEAL 180  tablet 3   Pilocarpine HCl (VUITY) 1.25 % SOLN Apply 1 drop to eye daily. 2.5 mL 3   traMADol (ULTRAM) 50 MG tablet Take 1 tablet (50 mg total) by mouth 3 (three) times daily as needed. 676 tablet 1   TRULICITY 1.5 HM/0.9OB SOPN SMARTSIG:0.5 Milliliter(s) SUB-Q Once a Week     No current facility-administered medications on file prior to visit.   No Known Allergies  Social History   Socioeconomic History   Marital status: Single    Spouse name: Not on file   Number of children: Not on file   Years of education: Not on file   Highest education level: Not on file  Occupational History   Not on file  Tobacco Use   Smoking status: Never   Smokeless tobacco: Never  Vaping Use   Vaping Use: Never used  Substance and Sexual Activity   Alcohol use: No   Drug use: No   Sexual activity: Not Currently  Other Topics Concern   Not on file  Social History Narrative   Not on file   Social Determinants of Health   Financial Resource Strain: Not on file  Food Insecurity: Not on file  Transportation Needs:  Not on file  Physical Activity: Not on file  Stress: Not on file  Social Connections: Not on file  Intimate Partner Violence: Not on file      Review of Systems     Objective:   Physical Exam Vitals reviewed.  Constitutional:      Appearance: Normal appearance. She is normal weight.  Cardiovascular:     Rate and Rhythm: Normal rate and regular rhythm.     Heart sounds: Normal heart sounds. No murmur heard.   No gallop.  Pulmonary:     Effort: Pulmonary effort is normal. No respiratory distress.     Breath sounds: Normal breath sounds. No stridor. No wheezing, rhonchi or rales.  Abdominal:     General: Abdomen is flat. Bowel sounds are normal.     Palpations: Abdomen is soft.  Musculoskeletal:     Left shoulder: Tenderness present. Decreased range of motion. Normal strength.     Right lower leg: No edema.     Left lower leg: No edema.  Neurological:     Mental  Status: She is alert.          Assessment & Plan:  Type 2 diabetes mellitus without complication, without long-term current use of insulin ([place]) - Plan: Ambulatory referral to Ophthalmology, Hemoglobin A1c, CBC with Differential/Platelet, COMPLETE METABOLIC PANEL WITH GFR, Lipid panel, Microalbumin, urine  Heartburn is better.  Stop sucralfate.  Continue protonix.  Check cbc, cmp, HgA1c, and FLP.  Goal HgA1c is less than 7.  Using sterile technique, I injected the left subacromial space with 2 cc of lidocaine, 2 cc of marcaine, and 2 cc of 40 mg/ml kenalog.  Patient tolerated procdure well without complications.

## 2021-04-13 LAB — LIPID PANEL
Cholesterol: 186 mg/dL (ref <200)
HDL: 86 mg/dL (ref >=50)
LDL Cholesterol (Calc): 86 mg/dL (calc)
Non-HDL Cholesterol (Calc): 100 mg/dL (calc) (ref <130)
Total CHOL/HDL Ratio: 2.2 (calc) (ref <5.0)
Triglycerides: 62 mg/dL (ref <150)

## 2021-04-13 LAB — COMPLETE METABOLIC PANEL WITH GFR
AG Ratio: 1.5 (calc) (ref 1.0–2.5)
ALT: 43 U/L — ABNORMAL HIGH (ref 6–29)
AST: 39 U/L — ABNORMAL HIGH (ref 10–35)
Albumin: 4.3 g/dL (ref 3.6–5.1)
Alkaline phosphatase (APISO): 134 U/L (ref 37–153)
BUN/Creatinine Ratio: 19 (calc) (ref 6–22)
BUN: 26 mg/dL — ABNORMAL HIGH (ref 7–25)
CO2: 27 mmol/L (ref 20–32)
Calcium: 9.3 mg/dL (ref 8.6–10.4)
Chloride: 102 mmol/L (ref 98–110)
Creat: 1.34 mg/dL — ABNORMAL HIGH (ref 0.50–0.99)
GFR, Est African American: 48 mL/min/{1.73_m2} — ABNORMAL LOW (ref >=60)
GFR, Est Non African American: 42 mL/min/{1.73_m2} — ABNORMAL LOW (ref >=60)
Globulin: 2.8 g/dL (calc) (ref 1.9–3.7)
Glucose, Bld: 102 mg/dL — ABNORMAL HIGH (ref 65–99)
Potassium: 4.5 mmol/L (ref 3.5–5.3)
Sodium: 138 mmol/L (ref 135–146)
Total Bilirubin: 0.6 mg/dL (ref 0.2–1.2)
Total Protein: 7.1 g/dL (ref 6.1–8.1)

## 2021-04-13 LAB — CBC WITH DIFFERENTIAL/PLATELET
Absolute Monocytes: 589 cells/uL (ref 200–950)
Basophils Absolute: 71 cells/uL (ref 0–200)
Basophils Relative: 1 %
Eosinophils Absolute: 525 cells/uL — ABNORMAL HIGH (ref 15–500)
Eosinophils Relative: 7.4 %
HCT: 32.9 % — ABNORMAL LOW (ref 35.0–45.0)
Hemoglobin: 11 g/dL — ABNORMAL LOW (ref 11.7–15.5)
Lymphs Abs: 1860 cells/uL (ref 850–3900)
MCH: 28.6 pg (ref 27.0–33.0)
MCHC: 33.4 g/dL (ref 32.0–36.0)
MCV: 85.7 fL (ref 80.0–100.0)
MPV: 10.5 fL (ref 7.5–12.5)
Monocytes Relative: 8.3 %
Neutro Abs: 4054 cells/uL (ref 1500–7800)
Neutrophils Relative %: 57.1 %
Platelets: 283 10*3/uL (ref 140–400)
RBC: 3.84 10*6/uL (ref 3.80–5.10)
RDW: 12.2 % (ref 11.0–15.0)
Total Lymphocyte: 26.2 %
WBC: 7.1 10*3/uL (ref 3.8–10.8)

## 2021-04-13 LAB — HEMOGLOBIN A1C
Hgb A1c MFr Bld: 6.5 % of total Hgb — ABNORMAL HIGH (ref <5.7)
Mean Plasma Glucose: 140 mg/dL
eAG (mmol/L): 7.7 mmol/L

## 2021-04-13 LAB — MICROALBUMIN, URINE: Microalb, Ur: 0.8 mg/dL

## 2021-04-25 ENCOUNTER — Other Ambulatory Visit: Payer: Self-pay | Admitting: Family Medicine

## 2021-04-25 DIAGNOSIS — Z1231 Encounter for screening mammogram for malignant neoplasm of breast: Secondary | ICD-10-CM

## 2021-04-27 ENCOUNTER — Ambulatory Visit
Admission: RE | Admit: 2021-04-27 | Discharge: 2021-04-27 | Disposition: A | Discharge status: Home / Self Care | Payer: BC Managed Care – PPO | Source: Ambulatory Visit | Attending: Family Medicine | Admitting: Family Medicine

## 2021-04-27 ENCOUNTER — Other Ambulatory Visit: Payer: Self-pay

## 2021-04-27 DIAGNOSIS — Z1231 Encounter for screening mammogram for malignant neoplasm of breast: Secondary | ICD-10-CM

## 2021-04-28 ENCOUNTER — Other Ambulatory Visit: Payer: Self-pay | Admitting: Family Medicine

## 2021-05-18 ENCOUNTER — Encounter: Payer: Self-pay | Admitting: Family Medicine

## 2021-05-18 ENCOUNTER — Other Ambulatory Visit: Payer: Self-pay | Admitting: Family Medicine

## 2021-05-18 MED ORDER — TRULICITY 1.5 MG/0.5ML ~~LOC~~ SOAJ: SUBCUTANEOUS | 3 refills | Status: DC

## 2021-05-18 MED ORDER — ATORVASTATIN CALCIUM 40 MG PO TABS: 40.0000 mg | ORAL_TABLET | Freq: Every day | ORAL | 1 refills | Status: DC

## 2021-05-18 MED ORDER — TRAMADOL HCL 50 MG PO TABS: 50.0000 mg | ORAL_TABLET | Freq: Three times a day (TID) | ORAL | 1 refills | Status: DC | PRN

## 2021-05-18 MED ORDER — LOSARTAN POTASSIUM 100 MG PO TABS: 100.0000 mg | ORAL_TABLET | Freq: Every day | ORAL | 3 refills | Status: DC

## 2021-05-18 NOTE — Telephone Encounter (Signed)
Routine medications sent to pharmacy.   Ok to refill Tramadol??  Last office visit 04/12/2021.  Last refill 03/26/2021, #1 refill.

## 2021-05-31 ENCOUNTER — Encounter: Payer: Self-pay | Admitting: Family Medicine

## 2021-06-14 ENCOUNTER — Other Ambulatory Visit: Payer: Self-pay | Admitting: Family Medicine

## 2021-06-18 ENCOUNTER — Other Ambulatory Visit: Payer: Self-pay | Admitting: Family Medicine

## 2021-06-19 NOTE — Telephone Encounter (Signed)
Ok to refill??  Last office visit 04/12/2021.  Last refill 03/26/2021, #3 refills.

## 2021-07-13 ENCOUNTER — Other Ambulatory Visit: Payer: Self-pay | Admitting: Family Medicine

## 2021-08-21 ENCOUNTER — Other Ambulatory Visit: Payer: Self-pay | Admitting: Family Medicine

## 2021-08-21 ENCOUNTER — Other Ambulatory Visit: Payer: Self-pay

## 2021-08-26 ENCOUNTER — Other Ambulatory Visit: Payer: Self-pay | Admitting: Family Medicine

## 2021-08-29 ENCOUNTER — Other Ambulatory Visit: Payer: Self-pay | Admitting: Family Medicine

## 2021-09-04 ENCOUNTER — Other Ambulatory Visit: Payer: Self-pay

## 2021-09-04 ENCOUNTER — Encounter: Payer: Self-pay | Admitting: Family Medicine

## 2021-09-04 MED ORDER — TRAMADOL HCL 50 MG PO TABS: 50.0000 mg | ORAL_TABLET | Freq: Three times a day (TID) | ORAL | 1 refills | Status: DC | PRN

## 2021-09-04 NOTE — Telephone Encounter (Signed)
LOV 04/12/21 Last refill 05/18/21, #180, 1 refill  Please review, thanks!

## 2021-09-11 ENCOUNTER — Other Ambulatory Visit: Payer: Self-pay | Admitting: Family Medicine

## 2021-09-12 ENCOUNTER — Encounter: Payer: Self-pay | Admitting: Family Medicine

## 2021-09-18 ENCOUNTER — Other Ambulatory Visit: Payer: Self-pay

## 2021-09-18 MED ORDER — TRAMADOL HCL 50 MG PO TABS: 50.0000 mg | ORAL_TABLET | Freq: Three times a day (TID) | ORAL | 1 refills | Status: DC | PRN

## 2021-10-15 ENCOUNTER — Other Ambulatory Visit: Payer: Self-pay | Admitting: Family Medicine

## 2021-10-16 ENCOUNTER — Other Ambulatory Visit: Payer: Self-pay | Admitting: Family Medicine

## 2021-10-16 ENCOUNTER — Ambulatory Visit: Payer: BC Managed Care – PPO | Admitting: Family Medicine

## 2021-10-16 ENCOUNTER — Other Ambulatory Visit: Payer: Self-pay

## 2021-10-16 MED ORDER — TRAMADOL HCL 50 MG PO TABS: 50.0000 mg | ORAL_TABLET | Freq: Three times a day (TID) | ORAL | 1 refills | Status: DC | PRN

## 2021-10-17 NOTE — Addendum Note (Signed)
Addended by: Amado Coe on: 10/17/2021 03:38 PM   Modules accepted: Orders

## 2021-10-18 MED ORDER — TRAMADOL HCL 50 MG PO TABS: 50.0000 mg | ORAL_TABLET | Freq: Three times a day (TID) | ORAL | 1 refills | Status: DC | PRN

## 2021-10-30 ENCOUNTER — Encounter: Payer: Self-pay | Admitting: Family Medicine

## 2021-10-30 ENCOUNTER — Ambulatory Visit: Payer: BC Managed Care – PPO | Admitting: Family Medicine

## 2021-10-30 ENCOUNTER — Encounter (INDEPENDENT_AMBULATORY_CARE_PROVIDER_SITE_OTHER): Payer: Self-pay

## 2021-10-30 ENCOUNTER — Other Ambulatory Visit: Payer: Self-pay

## 2021-10-30 VITALS — BP 138/72 | HR 83 | Temp 97.3°F | Resp 18 | Ht 61.0 in | Wt 118.0 lb

## 2021-10-30 DIAGNOSIS — E119 Type 2 diabetes mellitus without complications: Secondary | ICD-10-CM

## 2021-10-30 DIAGNOSIS — I10 Essential (primary) hypertension: Secondary | ICD-10-CM | POA: Diagnosis not present

## 2021-10-30 DIAGNOSIS — N183 Chronic kidney disease, stage 3 unspecified: Secondary | ICD-10-CM | POA: Diagnosis not present

## 2021-10-30 NOTE — Progress Notes (Signed)
Subjective:    Patient ID: Tracy Price, female    DOB: 21-Jun-1956, 66 y.o.   MRN: [id number]  HPI  Patient is here today for a follow-up on her diabetes.  She independently started back on Januvia and added that back to her Trulicity.  She states that she did this because her fasting blood sugar from 120.  Since resuming the Januvia, her blood sugars are now in the 80s.  She denies any hypoglycemic episodes.  She denies any chest pain or shortness of breath or dyspnea on exertion.  She denies any neuropathy in the feet.  She denies any burning pain in her feet.  She denies any polyuria, polydipsia, or blurry vision.  Blood pressure today is adequately controlled. Past Medical History:  Diagnosis Date   Allergy    Chest pain    CKD (chronic kidney disease) stage 3, GFR 30-59 ml/min (HCC)    Diabetes mellitus without complication ([place])    type 2   Hyperlipidemia    Hypertension    Sleep apnea 1995   had surgery to correct   Subdural hematoma     Past Surgical History:  Procedure Laterality Date   CRANIOTOMY Left 03/05/2017   Procedure: LEFT BURR HOLE  HEMATOMA EVACUATION SUBDURAL;  Surgeon: Kary Kos, MD;  Location: [place];  Service: Neurosurgery;  Laterality: Left;   GASTRIC BYPASS  2005   orif arm     TONSILLECTOMY     Current Outpatient Medications on File Prior to Visit  Medication Sig Dispense Refill   ALPRAZolam (XANAX) 0.5 MG tablet TAKE 1 TABLET BY MOUTH THREE TIMES A DAY AS NEEDED FOR ANXIETY 30 tablet 3   amLODipine (NORVASC) 5 MG tablet TAKE 1 TABLET BY MOUTH EVERY DAY 90 tablet 3   atorvastatin (LIPITOR) 40 MG tablet TAKE 1 TABLET BY MOUTH EVERY DAY 90 tablet 1   Blood Glucose Monitoring Suppl (ONETOUCH VERIO) w/Device KIT Check BS BID (E11.9) 1 kit 2   diclofenac (VOLTAREN) 75 MG EC tablet TAKE 1 TABLET BY MOUTH TWICE A DAY 180 tablet 2   hydrochlorothiazide (MICROZIDE) 12.5 MG capsule TAKE 1 CAPSULE BY MOUTH EVERY DAY 90 capsule 3   JANUVIA 100 MG tablet TAKE 1  TABLET BY MOUTH EVERY DAY 90 tablet 2   losartan (COZAAR) 100 MG tablet Take 1 tablet (100 mg total) by mouth daily. 90 tablet 3   Melatonin 5 MG TABS Take by mouth.     ONETOUCH VERIO test strip TEST TWICE A DAY 200 strip 3   pantoprazole (PROTONIX) 40 MG tablet TAKE 1 TABLET BY MOUTH TWICE A DAY BEFORE A MEAL 180 tablet 3   sucralfate (CARAFATE) 1 g tablet TAKE 1 TABLET (1 G TOTAL) BY MOUTH 4 TIMES A DAY WITH MEALS AND AT BEDTIME 360 tablet 1   traMADol (ULTRAM) 50 MG tablet Take 1 tablet (50 mg total) by mouth 3 (three) times daily as needed. 811 tablet 1   TRULICITY 1.5 SR/1.5XY SOPN INJECT 0.5 MILLILITERS UNDER THE SKIN ONCE A WEEK 2 mL 3   No current facility-administered medications on file prior to visit.   No Known Allergies  Social History   Socioeconomic History   Marital status: Single    Spouse name: Not on file   Number of children: Not on file   Years of education: Not on file   Highest education level: Not on file  Occupational History   Not on file  Tobacco Use   Smoking status:  Never   Smokeless tobacco: Never  Vaping Use   Vaping Use: Never used  Substance and Sexual Activity   Alcohol use: No   Drug use: No   Sexual activity: Not Currently  Other Topics Concern   Not on file  Social History Narrative   Not on file   Social Determinants of Health   Financial Resource Strain: Not on file  Food Insecurity: Not on file  Transportation Needs: Not on file  Physical Activity: Not on file  Stress: Not on file  Social Connections: Not on file  Intimate Partner Violence: Not on file      Review of Systems     Objective:   Physical Exam Vitals reviewed.  Constitutional:      Appearance: Normal appearance. She is normal weight.  Cardiovascular:     Rate and Rhythm: Normal rate and regular rhythm.     Heart sounds: Normal heart sounds. No murmur heard.   No gallop.  Pulmonary:     Effort: Pulmonary effort is normal. No respiratory distress.      Breath sounds: Normal breath sounds. No stridor. No wheezing, rhonchi or rales.  Abdominal:     General: Abdomen is flat. Bowel sounds are normal.     Palpations: Abdomen is soft.  Musculoskeletal:     Right lower leg: No edema.     Left lower leg: No edema.  Neurological:     Mental Status: She is alert.          Assessment & Plan:  Type 2 diabetes mellitus without complication, without long-term current use of insulin (HCC) - Plan: Hemoglobin A1c, CBC with Differential/Platelet, Lipid panel, Microalbumin, urine, COMPLETE METABOLIC PANEL WITH GFR, Hemoglobin A1c  Stage 3 chronic kidney disease, unspecified whether stage 3a or 3b CKD (HCC)  Benign essential HTN Diabetic foot exam was performed today and was normal.  I recommended a diabetic eye exam however the patient states that her insurance will pay for it.  She refuses a referral for ophthalmology.  Blood pressure is excellent.  I will check a CBC, CMP, lipid panel, A1c, and urine microalbumin.  Goal A1c is less than 6.5, goal LDL cholesterol is less than 100, goal microalbumin to creatinine ratio is less than 30.  If A1c is less than 6.5 I will simply stop the Januvia.  Greater than 6.5 I would replace Januvia with Jardiance or [place] and continue Trulicity.  Strongly recommended diabetic eye exam

## 2021-10-31 LAB — COMPLETE METABOLIC PANEL WITH GFR
AG Ratio: 1.5 (calc) (ref 1.0–2.5)
ALT: 25 U/L (ref 6–29)
AST: 24 U/L (ref 10–35)
Albumin: 4.3 g/dL (ref 3.6–5.1)
Alkaline phosphatase (APISO): 77 U/L (ref 37–153)
BUN/Creatinine Ratio: 20 (calc) (ref 6–22)
BUN: 27 mg/dL — ABNORMAL HIGH (ref 7–25)
CO2: 28 mmol/L (ref 20–32)
Calcium: 9.3 mg/dL (ref 8.6–10.4)
Chloride: 101 mmol/L (ref 98–110)
Creat: 1.38 mg/dL — ABNORMAL HIGH (ref 0.50–1.05)
Globulin: 2.8 g/dL (calc) (ref 1.9–3.7)
Glucose, Bld: 91 mg/dL (ref 65–99)
Potassium: 3.8 mmol/L (ref 3.5–5.3)
Sodium: 136 mmol/L (ref 135–146)
Total Bilirubin: 0.8 mg/dL (ref 0.2–1.2)
Total Protein: 7.1 g/dL (ref 6.1–8.1)
eGFR: 43 mL/min/{1.73_m2} — ABNORMAL LOW (ref >=60)

## 2021-10-31 LAB — CBC WITH DIFFERENTIAL/PLATELET
Absolute Monocytes: 504 cells/uL (ref 200–950)
Basophils Absolute: 72 cells/uL (ref 0–200)
Basophils Relative: 0.9 %
Eosinophils Absolute: 200 cells/uL (ref 15–500)
Eosinophils Relative: 2.5 %
HCT: 39 % (ref 35.0–45.0)
Hemoglobin: 12.5 g/dL (ref 11.7–15.5)
Lymphs Abs: 1672 cells/uL (ref 850–3900)
MCH: 29.3 pg (ref 27.0–33.0)
MCHC: 32.1 g/dL (ref 32.0–36.0)
MCV: 91.5 fL (ref 80.0–100.0)
MPV: 11.3 fL (ref 7.5–12.5)
Monocytes Relative: 6.3 %
Neutro Abs: 5552 cells/uL (ref 1500–7800)
Neutrophils Relative %: 69.4 %
Platelets: 247 10*3/uL (ref 140–400)
RBC: 4.26 10*6/uL (ref 3.80–5.10)
RDW: 13.4 % (ref 11.0–15.0)
Total Lymphocyte: 20.9 %
WBC: 8 10*3/uL (ref 3.8–10.8)

## 2021-10-31 LAB — LIPID PANEL
Cholesterol: 207 mg/dL — ABNORMAL HIGH (ref <200)
HDL: 74 mg/dL (ref >=50)
LDL Cholesterol (Calc): 112 mg/dL (calc) — ABNORMAL HIGH
Non-HDL Cholesterol (Calc): 133 mg/dL (calc) — ABNORMAL HIGH (ref <130)
Total CHOL/HDL Ratio: 2.8 (calc) (ref <5.0)
Triglycerides: 104 mg/dL (ref <150)

## 2021-10-31 LAB — MICROALBUMIN, URINE: Microalb, Ur: 1.5 mg/dL

## 2021-10-31 LAB — HEMOGLOBIN A1C
Hgb A1c MFr Bld: 5.8 % of total Hgb — ABNORMAL HIGH (ref <5.7)
Mean Plasma Glucose: 120 mg/dL
eAG (mmol/L): 6.6 mmol/L

## 2021-11-01 ENCOUNTER — Encounter: Payer: Self-pay | Admitting: Family Medicine

## 2021-11-01 ENCOUNTER — Other Ambulatory Visit: Payer: Self-pay

## 2021-11-01 MED ORDER — ROSUVASTATIN CALCIUM 10 MG PO TABS: 10.0000 mg | ORAL_TABLET | Freq: Every day | ORAL | 3 refills | Status: DC

## 2021-11-06 ENCOUNTER — Other Ambulatory Visit: Payer: Self-pay | Admitting: Family Medicine

## 2021-11-06 NOTE — Telephone Encounter (Signed)
Xanax refill request.  Last seen 10/30/2021, last filled 09/13/2021.

## 2021-11-13 ENCOUNTER — Encounter (INDEPENDENT_AMBULATORY_CARE_PROVIDER_SITE_OTHER): Payer: Self-pay

## 2021-12-11 ENCOUNTER — Other Ambulatory Visit: Payer: Self-pay | Admitting: Family Medicine

## 2021-12-12 ENCOUNTER — Encounter: Payer: Self-pay | Admitting: Family Medicine

## 2021-12-12 ENCOUNTER — Other Ambulatory Visit: Payer: Self-pay | Admitting: Family Medicine

## 2021-12-12 MED ORDER — ALPRAZOLAM 0.5 MG PO TABS: 0.5000 mg | ORAL_TABLET | Freq: Three times a day (TID) | ORAL | 1 refills | Status: DC | PRN

## 2021-12-12 NOTE — Telephone Encounter (Signed)
Please advise on request for 90D supply of Xanax. Thanks! ?

## 2021-12-13 ENCOUNTER — Encounter: Payer: Self-pay | Admitting: Family Medicine

## 2021-12-13 MED ORDER — TRULICITY 1.5 MG/0.5ML ~~LOC~~ SOAJ: SUBCUTANEOUS | 3 refills | Status: DC

## 2021-12-13 NOTE — Telephone Encounter (Signed)
Rx sent in yesterday. ? ?Please refuse, thanks! ?

## 2021-12-17 ENCOUNTER — Encounter: Payer: Self-pay | Admitting: Family Medicine

## 2021-12-18 ENCOUNTER — Other Ambulatory Visit: Payer: Self-pay | Admitting: Family Medicine

## 2021-12-18 MED ORDER — NEXLETOL 180 MG PO TABS: 180.0000 mg | ORAL_TABLET | Freq: Every day | ORAL | 3 refills | Status: DC

## 2021-12-31 ENCOUNTER — Other Ambulatory Visit: Payer: Self-pay | Admitting: Family Medicine

## 2022-01-07 ENCOUNTER — Other Ambulatory Visit: Payer: Self-pay | Admitting: Family Medicine

## 2022-01-25 ENCOUNTER — Encounter: Payer: Self-pay | Admitting: Family Medicine

## 2022-01-28 MED ORDER — ALPRAZOLAM 0.5 MG PO TABS: 0.5000 mg | ORAL_TABLET | Freq: Three times a day (TID) | ORAL | 1 refills | Status: DC | PRN

## 2022-01-28 MED ORDER — TRAMADOL HCL 50 MG PO TABS: 50.0000 mg | ORAL_TABLET | Freq: Three times a day (TID) | ORAL | 1 refills | Status: DC | PRN

## 2022-01-28 NOTE — Telephone Encounter (Signed)
Tramadol ?LOV 10/30/21 ?Last refill 10/18/21, #180, 1 refills ? ?LOV 10/30/21 ?Last refill 12/12/21, #90, 1 refills ? ?Please review, thanks! ? ? ?Please review, thanks! ? ? ?

## 2022-01-31 ENCOUNTER — Telehealth: Payer: Self-pay

## 2022-01-31 MED ORDER — SUCRALFATE 1 G PO TABS: ORAL_TABLET | ORAL | 1 refills | Status: DC

## 2022-01-31 NOTE — Telephone Encounter (Signed)
Pharmacy faxed a refill request for ? ?sucralfate (CARAFATE) 1 g tablet [[id number]]  ?  Order Details ?Dose, Route, Frequency: As Directed  ?Dispense Quantity: 360 tablet Refills: 1   ?     ?Sig: TAKE 1 TABLET (1 G TOTAL) BY MOUTH 4 TIMES A DAY WITH MEALS AND AT BEDTIME  ?     ?Start Date: 10/15/21 End Date: --  ?Written Date: 10/15/21 Expiration Date: 10/15/22  ? ?

## 2022-02-06 ENCOUNTER — Encounter: Payer: Self-pay | Admitting: Family Medicine

## 2022-03-05 ENCOUNTER — Encounter: Payer: Self-pay | Admitting: Family Medicine

## 2022-03-13 ENCOUNTER — Encounter: Payer: Self-pay | Admitting: Family Medicine

## 2022-03-17 ENCOUNTER — Other Ambulatory Visit: Payer: Self-pay | Admitting: Family Medicine

## 2022-03-17 MED ORDER — TRAMADOL HCL 50 MG PO TABS: 50.0000 mg | ORAL_TABLET | Freq: Three times a day (TID) | ORAL | 1 refills | Status: DC | PRN

## 2022-03-21 ENCOUNTER — Ambulatory Visit: Payer: Medicare PPO | Admitting: Family Medicine

## 2022-03-22 ENCOUNTER — Other Ambulatory Visit: Payer: Self-pay

## 2022-03-22 ENCOUNTER — Ambulatory Visit: Payer: Self-pay | Admitting: Family Medicine

## 2022-03-29 ENCOUNTER — Ambulatory Visit (INDEPENDENT_AMBULATORY_CARE_PROVIDER_SITE_OTHER): Payer: Medicare PPO | Admitting: Family Medicine

## 2022-03-29 VITALS — BP 116/68 | HR 74 | Temp 98.4°F | Ht 61.0 in | Wt 122.0 lb

## 2022-03-29 DIAGNOSIS — Z8679 Personal history of other diseases of the circulatory system: Secondary | ICD-10-CM

## 2022-03-29 DIAGNOSIS — Z0000 Encounter for general adult medical examination without abnormal findings: Secondary | ICD-10-CM

## 2022-03-29 DIAGNOSIS — E119 Type 2 diabetes mellitus without complications: Secondary | ICD-10-CM | POA: Diagnosis not present

## 2022-03-29 DIAGNOSIS — I10 Essential (primary) hypertension: Secondary | ICD-10-CM

## 2022-03-29 DIAGNOSIS — Z23 Encounter for immunization: Secondary | ICD-10-CM

## 2022-03-29 DIAGNOSIS — N183 Chronic kidney disease, stage 3 unspecified: Secondary | ICD-10-CM | POA: Diagnosis not present

## 2022-03-29 DIAGNOSIS — Z780 Asymptomatic menopausal state: Secondary | ICD-10-CM | POA: Diagnosis not present

## 2022-03-29 DIAGNOSIS — K219 Gastro-esophageal reflux disease without esophagitis: Secondary | ICD-10-CM

## 2022-03-30 LAB — LIPID PANEL
Cholesterol: 178 mg/dL (ref <200)
HDL: 88 mg/dL (ref >=50)
LDL Cholesterol (Calc): 74 mg/dL (calc)
Non-HDL Cholesterol (Calc): 90 mg/dL (calc) (ref <130)
Total CHOL/HDL Ratio: 2 (calc) (ref <5.0)
Triglycerides: 77 mg/dL (ref <150)

## 2022-03-30 LAB — CBC WITH DIFFERENTIAL/PLATELET
Absolute Monocytes: 528 cells/uL (ref 200–950)
Basophils Absolute: 73 cells/uL (ref 0–200)
Basophils Relative: 1.1 %
Eosinophils Absolute: 389 cells/uL (ref 15–500)
Eosinophils Relative: 5.9 %
HCT: 36.2 % (ref 35.0–45.0)
Hemoglobin: 12.1 g/dL (ref 11.7–15.5)
Lymphs Abs: 1742 cells/uL (ref 850–3900)
MCH: 30.9 pg (ref 27.0–33.0)
MCHC: 33.4 g/dL (ref 32.0–36.0)
MCV: 92.3 fL (ref 80.0–100.0)
MPV: 10.9 fL (ref 7.5–12.5)
Monocytes Relative: 8 %
Neutro Abs: 3868 cells/uL (ref 1500–7800)
Neutrophils Relative %: 58.6 %
Platelets: 242 10*3/uL (ref 140–400)
RBC: 3.92 10*6/uL (ref 3.80–5.10)
RDW: 12 % (ref 11.0–15.0)
Total Lymphocyte: 26.4 %
WBC: 6.6 10*3/uL (ref 3.8–10.8)

## 2022-03-30 LAB — COMPLETE METABOLIC PANEL WITH GFR
AG Ratio: 1.8 (calc) (ref 1.0–2.5)
ALT: 11 U/L (ref 6–29)
AST: 19 U/L (ref 10–35)
Albumin: 4.6 g/dL (ref 3.6–5.1)
Alkaline phosphatase (APISO): 63 U/L (ref 37–153)
BUN/Creatinine Ratio: 18 (calc) (ref 6–22)
BUN: 34 mg/dL — ABNORMAL HIGH (ref 7–25)
CO2: 28 mmol/L (ref 20–32)
Calcium: 9.5 mg/dL (ref 8.6–10.4)
Chloride: 99 mmol/L (ref 98–110)
Creat: 1.92 mg/dL — ABNORMAL HIGH (ref 0.50–1.05)
Globulin: 2.5 g/dL (calc) (ref 1.9–3.7)
Glucose, Bld: 95 mg/dL (ref 65–99)
Potassium: 4.1 mmol/L (ref 3.5–5.3)
Sodium: 136 mmol/L (ref 135–146)
Total Bilirubin: 0.6 mg/dL (ref 0.2–1.2)
Total Protein: 7.1 g/dL (ref 6.1–8.1)
eGFR: 29 mL/min/{1.73_m2} — ABNORMAL LOW (ref >=60)

## 2022-03-30 LAB — MICROALBUMIN, URINE: Microalb, Ur: 0.5 mg/dL

## 2022-03-30 LAB — HEMOGLOBIN A1C
Hgb A1c MFr Bld: 5.4 % of total Hgb (ref <5.7)
Mean Plasma Glucose: 108 mg/dL
eAG (mmol/L): 6 mmol/L

## 2022-04-01 ENCOUNTER — Other Ambulatory Visit: Payer: Self-pay | Admitting: Family Medicine

## 2022-04-01 ENCOUNTER — Encounter: Payer: Self-pay | Admitting: Family Medicine

## 2022-04-01 DIAGNOSIS — Z1231 Encounter for screening mammogram for malignant neoplasm of breast: Secondary | ICD-10-CM

## 2022-04-02 ENCOUNTER — Encounter: Payer: Self-pay | Admitting: Family Medicine

## 2022-04-15 ENCOUNTER — Other Ambulatory Visit: Payer: Self-pay | Admitting: Family Medicine

## 2022-04-15 ENCOUNTER — Other Ambulatory Visit: Payer: Self-pay

## 2022-04-15 NOTE — Telephone Encounter (Signed)
Pharmacy faxed a new refill request for ALPRAZolam Duanne Moron) 0.5 MG tablet [[id number]]    Order Details Dose: 0.5 mg Route: Oral Frequency: 3 times daily PRN for anxiety, 90 day supply  Dispense Quantity: 90 tablet Refills: 1   Note to Pharmacy: This request is for a new prescription for a controlled substance as required by Federal/State law.       Sig: Take 1 tablet (0.5 mg total) by mouth 3 (three) times daily as needed for anxiety (90 day supply).       Start Date: 01/28/22 End Date: --  Written Date: 01/28/22 Expiration Date: 07/27/22

## 2022-04-16 ENCOUNTER — Encounter: Payer: Self-pay | Admitting: Family Medicine

## 2022-04-16 NOTE — Telephone Encounter (Signed)
Requested medication (s) are due for refill today: no  Requested medication (s) are on the active medication list: yes  Last refill:  04/15/22  Future visit scheduled: yes  Notes to clinic:  Unable to refill per protocol, Rx was refilled 04/15/22 for 90 and 1 refill. Possible duplicate.     Requested Prescriptions  Pending Prescriptions Disp Refills   ALPRAZolam (XANAX) 0.5 MG tablet [Pharmacy Med Name: ALPRAZOLAM 0.5 MG TABLET] 90 tablet 1    Sig: Take 1 tablet (0.5 mg total) by mouth 3 (three) times daily as needed for anxiety (90 day supply).     Not Delegated - Psychiatry: Anxiolytics/Hypnotics 2 Failed - 04/15/2022 11:15 PM      Failed - This refill cannot be delegated      Failed - Urine Drug Screen completed in last 360 days      Passed - Patient is not pregnant      Passed - Valid encounter within last 6 months    Recent Outpatient Visits           5 months ago Type 2 diabetes mellitus without complication, without long-term current use of insulin ([place])   [place] Pickard, Cammie Mcgee, MD   1 year ago Type 2 diabetes mellitus without complication, without long-term current use of insulin ([place])   [place] Pickard, Cammie Mcgee, MD   1 year ago Stage 3 chronic kidney disease, unspecified whether stage 3a or 3b CKD ([place])   [place] Susy Frizzle, MD   1 year ago Benign paroxysmal positional vertigo of right ear   [place] Susy Frizzle, MD   2 years ago Subacromial bursitis of left shoulder joint   [place] Pickard, Cammie Mcgee, MD       Future Appointments             In 2 days Pickard, Cammie Mcgee, MD [place]

## 2022-04-16 NOTE — Telephone Encounter (Signed)
Requested medication (s) are due for refill today: no  Requested medication (s) are on the active medication list: yes  Last refill:  04/15/22  Future visit scheduled: yes  Notes to clinic:  Unable to refill per protocol, Rx was refilled 04/15/22 by PCP,possible duplicate.     Requested Prescriptions  Pending Prescriptions Disp Refills   ALPRAZolam (XANAX) 0.5 MG tablet 90 tablet 1    Sig: Take 1 tablet (0.5 mg total) by mouth 3 (three) times daily as needed for anxiety (90 day supply).     Not Delegated - Psychiatry: Anxiolytics/Hypnotics 2 Failed - 04/15/2022  1:20 PM      Failed - This refill cannot be delegated      Failed - Urine Drug Screen completed in last 360 days      Passed - Patient is not pregnant      Passed - Valid encounter within last 6 months    Recent Outpatient Visits           5 months ago Type 2 diabetes mellitus without complication, without long-term current use of insulin ([place])   [place] Pickard, Cammie Mcgee, MD   1 year ago Type 2 diabetes mellitus without complication, without long-term current use of insulin ([place])   [place] Susy Frizzle, MD   1 year ago Stage 3 chronic kidney disease, unspecified whether stage 3a or 3b CKD ([place])   [place] Susy Frizzle, MD   1 year ago Benign paroxysmal positional vertigo of right ear   [place] Susy Frizzle, MD   2 years ago Subacromial bursitis of left shoulder joint   [place] Pickard, Cammie Mcgee, MD       Future Appointments             In 2 days Pickard, Cammie Mcgee, MD [place]

## 2022-04-17 NOTE — Telephone Encounter (Signed)
LOV 03/29/22 Last refill 01/28/22, #90, 1 refills  Please review, thanks!

## 2022-04-18 ENCOUNTER — Ambulatory Visit: Payer: Medicare PPO | Admitting: Family Medicine

## 2022-04-18 VITALS — BP 120/72 | HR 95 | Temp 97.5°F | Ht 61.0 in | Wt 125.0 lb

## 2022-04-18 DIAGNOSIS — N289 Disorder of kidney and ureter, unspecified: Secondary | ICD-10-CM | POA: Diagnosis not present

## 2022-04-18 DIAGNOSIS — N183 Chronic kidney disease, stage 3 unspecified: Secondary | ICD-10-CM

## 2022-04-18 MED ORDER — ALPRAZOLAM 0.5 MG PO TABS
0.5000 mg | ORAL_TABLET | Freq: Three times a day (TID) | ORAL | 1 refills | Status: DC | PRN
Start: 2022-04-18 — End: 2022-10-03

## 2022-04-18 NOTE — Progress Notes (Signed)
Subjective:    Patient ID: Tracy Price, female    DOB: 08-Oct-1955, 66 y.o.   MRN: [id number]  HPI  Patient is a very pleasant 66 year old Caucasian female.  In June, her baseline lab work showed an elevation in her creatinine to 1.92.  Typically her creatinine is 1.31-1.38.  She does have a history of chronic kidney disease stage III with a typical GFR 47-50.  This represented a sudden decline for no reason.  She denies taking any NSAIDs.  She is not drinking enough water per her report.  She is also on hydrochlorothiazide.  She has been on losartan for years.  She is here today to repeat her creatinine Past Medical History:  Diagnosis Date   Allergy    Chest pain    CKD (chronic kidney disease) stage 3, GFR 30-59 ml/min (HCC)    Diabetes mellitus without complication ([place])    type 2   Hyperlipidemia    Hypertension    Sleep apnea 1995   had surgery to correct   Subdural hematoma     Past Surgical History:  Procedure Laterality Date   CRANIOTOMY Left 03/05/2017   Procedure: LEFT BURR HOLE  HEMATOMA EVACUATION SUBDURAL;  Surgeon: Kary Kos, MD;  Location: [facility];  Service: Neurosurgery;  Laterality: Left;   GASTRIC BYPASS  2005   orif arm     TONSILLECTOMY     Current Outpatient Medications on File Prior to Visit  Medication Sig Dispense Refill   ALPRAZolam (XANAX) 0.5 MG tablet Take 1 tablet (0.5 mg total) by mouth 3 (three) times daily as needed for anxiety (90 day supply). 90 tablet 1   amLODipine (NORVASC) 5 MG tablet TAKE 1 TABLET BY MOUTH EVERY DAY 90 tablet 3   Blood Glucose Monitoring Suppl (ONETOUCH VERIO) w/Device KIT Check BS BID (E11.9) 1 kit 2   Dulaglutide (TRULICITY) 1.65 YO/3.7CH SOPN Inject 1.5 mg into the skin once a week. INJECT 0.5 MILLILITERS UNDER THE SKIN ONCE A WEEK 2 mL 3   hydrochlorothiazide (MICROZIDE) 12.5 MG capsule TAKE 1 CAPSULE BY MOUTH EVERY DAY 90 capsule 3   losartan (COZAAR) 100 MG tablet Take 1 tablet (100 mg total) by mouth daily. 90 tablet  3   Melatonin 5 MG TABS Take by mouth.     ONETOUCH VERIO test strip TEST TWICE A DAY 200 strip 3   pantoprazole (PROTONIX) 40 MG tablet TAKE 1 TABLET BY MOUTH TWICE A DAY BEFORE A MEAL 180 tablet 3   rosuvastatin (CRESTOR) 10 MG tablet Take 1 tablet (10 mg total) by mouth daily. 90 tablet 3   sucralfate (CARAFATE) 1 g tablet TAKE 1 TABLET (1 G TOTAL) BY MOUTH 4 TIMES A DAY WITH MEALS AND AT BEDTIME 360 tablet 1   traMADol (ULTRAM) 50 MG tablet Take 1 tablet (50 mg total) by mouth 3 (three) times daily as needed. Lease disregard previous prescription for 180 tabs and only fill rx for 90 tabs. 90 tablet 1   No current facility-administered medications on file prior to visit.   No Known Allergies  Social History   Socioeconomic History   Marital status: Single    Spouse name: Not on file   Number of children: Not on file   Years of education: Not on file   Highest education level: Not on file  Occupational History   Not on file  Tobacco Use   Smoking status: Never   Smokeless tobacco: Never  Vaping Use   Vaping Use:  Never used  Substance and Sexual Activity   Alcohol use: No   Drug use: No   Sexual activity: Not Currently  Other Topics Concern   Not on file  Social History Narrative   Not on file   Social Determinants of Health   Financial Resource Strain: Not on file  Food Insecurity: Not on file  Transportation Needs: Not on file  Physical Activity: Not on file  Stress: Not on file  Social Connections: Not on file  Intimate Partner Violence: Not on file      Review of Systems     Objective:   Physical Exam Vitals reviewed.  Constitutional:      Appearance: Normal appearance. She is normal weight.  Cardiovascular:     Rate and Rhythm: Normal rate and regular rhythm.     Heart sounds: Normal heart sounds. No murmur heard.    No gallop.  Pulmonary:     Effort: Pulmonary effort is normal. No respiratory distress.     Breath sounds: Normal breath sounds. No  stridor. No wheezing, rhonchi or rales.  Abdominal:     General: Abdomen is flat. Bowel sounds are normal.     Palpations: Abdomen is soft.  Musculoskeletal:     Right lower leg: No edema.     Left lower leg: No edema.  Neurological:     Mental Status: She is alert.           Assessment & Plan:  Stage 3 chronic kidney disease, unspecified whether stage 3a or 3b CKD ([place]) - Plan: Protein / Creatinine Ratio, Urine, Urinalysis, Routine w reflex microscopic, BASIC METABOLIC PANEL WITH GFR  Renal insufficiency - Plan: Protein / Creatinine Ratio, Urine, Urinalysis, Routine w reflex microscopic, BASIC METABOLIC PANEL WITH GFR Recheck BMP today to monitor creatinine along with a urine protein to creatinine ratio as well as a urinalysis.  If creatinine remains elevated I would recommend a renal ultrasound to rule out masses and obstruction.  If protein to creatinine ratio is elevated I would recommend starting Farxiga.  If creatinine remains elevated I will discontinue hydrochlorothiazide.

## 2022-04-19 ENCOUNTER — Encounter: Payer: Self-pay | Admitting: Family Medicine

## 2022-04-19 ENCOUNTER — Other Ambulatory Visit: Payer: Self-pay

## 2022-04-19 LAB — BASIC METABOLIC PANEL WITH GFR
BUN/Creatinine Ratio: 16 (calc) (ref 6–22)
BUN: 25 mg/dL (ref 7–25)
CO2: 27 mmol/L (ref 20–32)
Calcium: 9.4 mg/dL (ref 8.6–10.4)
Chloride: 98 mmol/L (ref 98–110)
Creat: 1.61 mg/dL — ABNORMAL HIGH (ref 0.50–1.05)
Glucose, Bld: 89 mg/dL (ref 65–99)
Potassium: 4 mmol/L (ref 3.5–5.3)
Sodium: 135 mmol/L (ref 135–146)
eGFR: 35 mL/min/{1.73_m2} — ABNORMAL LOW (ref >=60)

## 2022-04-19 LAB — URINALYSIS, ROUTINE W REFLEX MICROSCOPIC
Bilirubin Urine: NEGATIVE
Glucose, UA: NEGATIVE
Hgb urine dipstick: NEGATIVE
Hyaline Cast: NONE SEEN /LPF
Ketones, ur: NEGATIVE
Nitrite: POSITIVE — AB
Protein, ur: NEGATIVE
RBC / HPF: NONE SEEN /HPF (ref 0–2)
Specific Gravity, Urine: 1.006 (ref 1.001–1.035)
Squamous Epithelial / HPF: NONE SEEN /HPF (ref <=5)
pH: 5.5 (ref 5.0–8.0)

## 2022-04-19 LAB — PROTEIN / CREATININE RATIO, URINE
Creatinine, Urine: 23 mg/dL (ref 20–275)
Protein/Creat Ratio: 217 mg/g creat — ABNORMAL HIGH (ref 24–184)
Protein/Creatinine Ratio: 0.217 mg/mg creat — ABNORMAL HIGH (ref 0.024–0.184)
Total Protein, Urine: 5 mg/dL (ref 5–24)

## 2022-04-19 MED ORDER — DAPAGLIFLOZIN PROPANEDIOL 10 MG PO TABS: 10.0000 mg | ORAL_TABLET | Freq: Every day | ORAL | 1 refills | Status: DC

## 2022-04-19 MED ORDER — CEPHALEXIN 500 MG PO CAPS: 500.0000 mg | ORAL_CAPSULE | Freq: Three times a day (TID) | ORAL | 0 refills | Status: AC

## 2022-04-20 ENCOUNTER — Encounter: Payer: Self-pay | Admitting: Family Medicine

## 2022-04-22 ENCOUNTER — Telehealth: Payer: Self-pay

## 2022-04-22 NOTE — Telephone Encounter (Signed)
Pt called to reschedule lab appt to 7/25 at 10

## 2022-04-22 NOTE — Telephone Encounter (Signed)
Already done and taken care of. Pt is aware.

## 2022-04-23 ENCOUNTER — Encounter: Payer: Self-pay | Admitting: Family Medicine

## 2022-04-23 ENCOUNTER — Other Ambulatory Visit: Payer: Self-pay | Admitting: Family Medicine

## 2022-04-23 MED ORDER — KERENDIA 10 MG PO TABS: 10.0000 mg | ORAL_TABLET | Freq: Every day | ORAL | 5 refills | Status: DC

## 2022-04-23 MED ORDER — DAPAGLIFLOZIN PROPANEDIOL 10 MG PO TABS: 10.0000 mg | ORAL_TABLET | Freq: Every day | ORAL | 3 refills | Status: DC

## 2022-04-25 NOTE — Telephone Encounter (Signed)
Pt's insurance -[employer], called requesting for health questions to complete pt's PA for [place]. Per [place], PA will be processes within the next 24hrs and we should get an answer re PA.

## 2022-04-26 ENCOUNTER — Other Ambulatory Visit: Payer: Medicare PPO

## 2022-04-30 ENCOUNTER — Other Ambulatory Visit: Payer: Medicare PPO

## 2022-04-30 ENCOUNTER — Other Ambulatory Visit: Payer: Self-pay

## 2022-04-30 ENCOUNTER — Telehealth: Payer: Self-pay

## 2022-04-30 DIAGNOSIS — N183 Chronic kidney disease, stage 3 unspecified: Secondary | ICD-10-CM

## 2022-04-30 DIAGNOSIS — N390 Urinary tract infection, site not specified: Secondary | ICD-10-CM

## 2022-04-30 NOTE — Telephone Encounter (Signed)
Received documentation from [facility] that the PA for the pt's Farxiga 10 mg was approved. Thanks.

## 2022-05-01 LAB — URINALYSIS, ROUTINE W REFLEX MICROSCOPIC
Bilirubin Urine: NEGATIVE
Glucose, UA: NEGATIVE
Hgb urine dipstick: NEGATIVE
Hyaline Cast: NONE SEEN /LPF
Ketones, ur: NEGATIVE
Leukocytes,Ua: NEGATIVE
Nitrite: POSITIVE — AB
Protein, ur: NEGATIVE
RBC / HPF: NONE SEEN /HPF (ref 0–2)
Specific Gravity, Urine: 1.009 (ref 1.001–1.035)
Squamous Epithelial / HPF: NONE SEEN /HPF (ref <=5)
pH: 5.5 (ref 5.0–8.0)

## 2022-05-01 LAB — MICROSCOPIC MESSAGE

## 2022-05-02 ENCOUNTER — Other Ambulatory Visit: Payer: Self-pay

## 2022-05-02 MED ORDER — PANTOPRAZOLE SODIUM 40 MG PO TBEC: DELAYED_RELEASE_TABLET | ORAL | 3 refills | Status: DC

## 2022-05-02 NOTE — Telephone Encounter (Signed)
Pharmacy faxed a refill request for pantoprazole (PROTONIX) 40 MG tablet [[id number]]    Order Details Dose, Route, Frequency: As Directed  Dispense Quantity: 180 tablet Refills: 3        Sig: TAKE 1 TABLET BY MOUTH TWICE A DAY BEFORE A MEAL       Start Date: 08/27/21 End Date: --  Written Date: 08/27/21 Expiration Date: 08/27/22

## 2022-05-03 ENCOUNTER — Encounter: Payer: Self-pay | Admitting: Family Medicine

## 2022-05-03 ENCOUNTER — Other Ambulatory Visit: Payer: Medicare PPO

## 2022-05-17 ENCOUNTER — Encounter: Payer: Self-pay | Admitting: Family Medicine

## 2022-05-18 ENCOUNTER — Encounter (INDEPENDENT_AMBULATORY_CARE_PROVIDER_SITE_OTHER): Payer: Self-pay

## 2022-05-30 LAB — HM DIABETES EYE EXAM

## 2022-06-05 ENCOUNTER — Ambulatory Visit: Payer: Medicare PPO

## 2022-06-06 ENCOUNTER — Other Ambulatory Visit: Payer: Medicare PPO

## 2022-06-06 ENCOUNTER — Ambulatory Visit: Payer: Medicare PPO

## 2022-06-06 ENCOUNTER — Encounter: Payer: Self-pay | Admitting: Family Medicine

## 2022-06-20 ENCOUNTER — Encounter: Payer: Self-pay | Admitting: Family Medicine

## 2022-06-21 NOTE — Telephone Encounter (Signed)
Pls advice  

## 2022-06-21 NOTE — Telephone Encounter (Signed)
Called and spoke w/the pharmacist at [facility]. Per pharmacist, suggest that pt wait a little longer before getting the new Covid Vaccine shot and try again to see if her insurance will approve coverage.   Pharmacist stated that it wouldn't make any difference even if pcp sent in Rx if her insurance want pt to get it at the pcp's office.  Advice pt regarding pharmacist's suggest. Pt voiced understanding and will wait for a week or few to try again.   Nothing further.

## 2022-06-23 ENCOUNTER — Encounter (INDEPENDENT_AMBULATORY_CARE_PROVIDER_SITE_OTHER): Payer: Self-pay

## 2022-06-26 ENCOUNTER — Encounter: Payer: Self-pay | Admitting: Family Medicine

## 2022-06-28 ENCOUNTER — Other Ambulatory Visit: Payer: Self-pay

## 2022-06-28 MED ORDER — HYDROCHLOROTHIAZIDE 12.5 MG PO CAPS: ORAL_CAPSULE | ORAL | 3 refills | Status: DC

## 2022-06-28 MED ORDER — LOSARTAN POTASSIUM 100 MG PO TABS: 100.0000 mg | ORAL_TABLET | Freq: Every day | ORAL | 3 refills | Status: DC

## 2022-06-28 NOTE — Telephone Encounter (Signed)
Requested Prescriptions  Pending Prescriptions Disp Refills  . hydrochlorothiazide (MICROZIDE) 12.5 MG capsule 90 capsule 3    Sig: TAKE 1 CAPSULE BY MOUTH EVERY DAY     Cardiovascular: Diuretics - Thiazide Failed - 06/28/2022 12:33 PM      Failed - Cr in normal range and within 180 days    Creat  Date Value Ref Range Status  04/18/2022 1.61 (H) 0.50 - 1.05 mg/dL Final   Creatinine, Urine  Date Value Ref Range Status  04/18/2022 23 20 - 275 mg/dL Final         Failed - Valid encounter within last 6 months    Recent Outpatient Visits          8 months ago Type 2 diabetes mellitus without complication, without long-term current use of insulin ([place])   [place] Pickard, Cammie Mcgee, MD   1 year ago Type 2 diabetes mellitus without complication, without long-term current use of insulin ([place])   [place] Pickard, Cammie Mcgee, MD   1 year ago Stage 3 chronic kidney disease, unspecified whether stage 3a or 3b CKD ([place])   [place] Pickard, Cammie Mcgee, MD   1 year ago Benign paroxysmal positional vertigo of right ear   [place] Susy Frizzle, MD   2 years ago Subacromial bursitis of left shoulder joint   [place] Pickard, Cammie Mcgee, MD             Passed - K in normal range and within 180 days    Potassium  Date Value Ref Range Status  04/18/2022 4.0 3.5 - 5.3 mmol/L Final         Passed - Na in normal range and within 180 days    Sodium  Date Value Ref Range Status  04/18/2022 135 135 - 146 mmol/L Final         Passed - Last BP in normal range    BP Readings from Last 1 Encounters:  04/18/22 120/72         . losartan (COZAAR) 100 MG tablet 90 tablet 3    Sig: Take 1 tablet (100 mg total) by mouth daily.     Cardiovascular:  Angiotensin Receptor Blockers Failed - 06/28/2022 12:33 PM      Failed - Cr in normal range and within 180 days    Creat  Date Value Ref Range Status   04/18/2022 1.61 (H) 0.50 - 1.05 mg/dL Final   Creatinine, Urine  Date Value Ref Range Status  04/18/2022 23 20 - 275 mg/dL Final         Failed - Valid encounter within last 6 months    Recent Outpatient Visits          8 months ago Type 2 diabetes mellitus without complication, without long-term current use of insulin ([place])   [place] Pickard, Cammie Mcgee, MD   1 year ago Type 2 diabetes mellitus without complication, without long-term current use of insulin ([place])   [place] Pickard, Cammie Mcgee, MD   1 year ago Stage 3 chronic kidney disease, unspecified whether stage 3a or 3b CKD ([place])   [place] Pickard, Cammie Mcgee, MD   1 year ago Benign paroxysmal positional vertigo of right ear   [place], Warren T, MD   2 years ago Subacromial bursitis of left shoulder joint   [place]  [facility] Susy Frizzle, MD             Passed - K in normal range and within 180 days    Potassium  Date Value Ref Range Status  04/18/2022 4.0 3.5 - 5.3 mmol/L Final         Passed - Patient is not pregnant      Passed - Last BP in normal range    BP Readings from Last 1 Encounters:  04/18/22 120/72

## 2022-06-28 NOTE — Telephone Encounter (Signed)
Pharmacy faxed a refill request for hydrochlorothiazide (MICROZIDE) 12.5 MG capsule [[id number]]    Order Details Dose, Route, Frequency: As Directed  Dispense Quantity: 90 capsule Refills: 3        Sig: TAKE 1 CAPSULE BY MOUTH EVERY DAY       Start Date: 06/15/21 End Date: --  Written Date: 06/15/21 Expiration Date: 06/15/22  Original Order:  hydrochlorothiazide (MICROZIDE) 12.5 MG capsule [[id number]]   losartan (COZAAR) 100 MG tablet [[id number]]    Order Details Dose: 100 mg Route: Oral Frequency: Daily  Dispense Quantity: 90 tablet Refills: 3        Sig: Take 1 tablet (100 mg total) by mouth daily.       Start Date: 05/18/21 End Date: --  Written Date: 05/18/21 Expiration Date: 05/18/22  Original Order:  losartan (COZAAR) 100 MG tablet [[id number]]

## 2022-06-28 NOTE — Telephone Encounter (Signed)
Pharmacy faxed a refill request for  Dulaglutide (TRULICITY) 5.03 UU/8.2CM SOPN [[id number]]  DISCONTINUED   Order Details Dose: 0.75 mg Route: Subcutaneous Frequency: Weekly  Dispense Quantity: 4 pen  Refills: 0        Sig: Inject 0.75 mg into the skin once a week.       Start Date: 03/17/18 End Date: 04/02/18  Discontinued by: Shary Decamp B, [place] on 04/02/2018 12:27  Reason: Change in therapy

## 2022-06-29 ENCOUNTER — Encounter: Payer: Self-pay | Admitting: Family Medicine

## 2022-07-01 MED ORDER — TRAMADOL HCL 50 MG PO TABS: 50.0000 mg | ORAL_TABLET | Freq: Three times a day (TID) | ORAL | 1 refills | Status: DC | PRN

## 2022-07-01 NOTE — Telephone Encounter (Signed)
Spoke w/pt, aware Rx has been sent to [facility].

## 2022-07-04 ENCOUNTER — Other Ambulatory Visit: Payer: Medicare PPO

## 2022-07-04 ENCOUNTER — Ambulatory Visit: Payer: Medicare PPO

## 2022-07-06 ENCOUNTER — Other Ambulatory Visit: Payer: Self-pay | Admitting: Family Medicine

## 2022-07-08 NOTE — Telephone Encounter (Signed)
Called this morning and LVM for return call re medication questions per pt.

## 2022-07-08 NOTE — Telephone Encounter (Signed)
Not on current med list. Requested Prescriptions  Refused Prescriptions Disp Refills  . TRULICITY 1.5 WC/3.7SE SOPN [Pharmacy Med Name: TRULICITY 1.5 GB/1.5 ML PEN]  23    Sig: INJECT 0.5 MLS INTO THE SKIN ONCE A WEEK     Endocrinology:  Diabetes - GLP-1 Receptor Agonists Failed - 07/06/2022 12:20 PM      Failed - Valid encounter within last 6 months    Recent Outpatient Visits          8 months ago Type 2 diabetes mellitus without complication, without long-term current use of insulin ([place])   [place] Pickard, Cammie Mcgee, MD   1 year ago Type 2 diabetes mellitus without complication, without long-term current use of insulin ([place])   [employer] Pickard, Cammie Mcgee, MD   1 year ago Stage 3 chronic kidney disease, unspecified whether stage 3a or 3b CKD ([place])   [place] Pickard, Cammie Mcgee, MD   1 year ago Benign paroxysmal positional vertigo of right ear   [place] Susy Frizzle, MD   2 years ago Subacromial bursitis of left shoulder joint   [place] Pickard, Cammie Mcgee, MD             Passed - HBA1C is between 0 and 7.9 and within 180 days    Hgb A1c MFr Bld  Date Value Ref Range Status  03/29/2022 5.4 <5.7 % of total Hgb Final    Comment:    For the purpose of screening for the presence of diabetes: . <5.7%       Consistent with the absence of diabetes 5.7-6.4%    Consistent with increased risk for diabetes             (prediabetes) > or =6.5%  Consistent with diabetes . This assay result is consistent with a decreased risk of diabetes. . Currently, no consensus exists regarding use of hemoglobin A1c for diagnosis of diabetes in children. . According to American Diabetes Association (ADA) guidelines, hemoglobin A1c <7.0% represents optimal control in non-pregnant diabetic patients. Different metrics may apply to specific patient populations.  Standards of Medical Care in  Diabetes(ADA). Marland Kitchen

## 2022-07-09 ENCOUNTER — Ambulatory Visit
Admission: RE | Admit: 2022-07-09 | Discharge: 2022-07-09 | Disposition: A | Discharge status: Home / Self Care | Payer: Medicare PPO | Source: Ambulatory Visit | Attending: Family Medicine | Admitting: Family Medicine

## 2022-07-09 ENCOUNTER — Ambulatory Visit: Payer: Medicare PPO

## 2022-07-09 ENCOUNTER — Other Ambulatory Visit: Payer: Self-pay | Admitting: Family Medicine

## 2022-07-09 DIAGNOSIS — Z1231 Encounter for screening mammogram for malignant neoplasm of breast: Secondary | ICD-10-CM

## 2022-07-09 DIAGNOSIS — M810 Age-related osteoporosis without current pathological fracture: Secondary | ICD-10-CM | POA: Diagnosis not present

## 2022-07-09 DIAGNOSIS — Z780 Asymptomatic menopausal state: Secondary | ICD-10-CM | POA: Diagnosis not present

## 2022-07-10 ENCOUNTER — Other Ambulatory Visit: Payer: Self-pay

## 2022-07-10 DIAGNOSIS — S065X0S Traumatic subdural hemorrhage without loss of consciousness, sequela: Secondary | ICD-10-CM

## 2022-07-10 DIAGNOSIS — G8929 Other chronic pain: Secondary | ICD-10-CM

## 2022-07-10 DIAGNOSIS — I671 Cerebral aneurysm, nonruptured: Secondary | ICD-10-CM

## 2022-07-10 MED ORDER — TRAMADOL HCL 50 MG PO TABS: 50.0000 mg | ORAL_TABLET | Freq: Three times a day (TID) | ORAL | 3 refills | Status: DC | PRN

## 2022-07-12 ENCOUNTER — Other Ambulatory Visit: Payer: Self-pay

## 2022-07-12 DIAGNOSIS — M810 Age-related osteoporosis without current pathological fracture: Secondary | ICD-10-CM

## 2022-07-12 MED ORDER — ALENDRONATE SODIUM 70 MG PO TABS: 70.0000 mg | ORAL_TABLET | ORAL | 11 refills | Status: DC

## 2022-07-15 ENCOUNTER — Encounter (INDEPENDENT_AMBULATORY_CARE_PROVIDER_SITE_OTHER): Payer: Self-pay

## 2022-07-28 ENCOUNTER — Encounter: Payer: Self-pay | Admitting: Family Medicine

## 2022-07-29 ENCOUNTER — Other Ambulatory Visit: Payer: Self-pay | Admitting: Family Medicine

## 2022-07-30 ENCOUNTER — Other Ambulatory Visit: Payer: Self-pay | Admitting: Family Medicine

## 2022-07-30 DIAGNOSIS — G8929 Other chronic pain: Secondary | ICD-10-CM

## 2022-07-30 DIAGNOSIS — S065X0S Traumatic subdural hemorrhage without loss of consciousness, sequela: Secondary | ICD-10-CM

## 2022-07-30 MED ORDER — TRAMADOL HCL 50 MG PO TABS: 50.0000 mg | ORAL_TABLET | Freq: Three times a day (TID) | ORAL | 3 refills | Status: DC | PRN

## 2022-08-27 ENCOUNTER — Other Ambulatory Visit: Payer: Self-pay | Admitting: Family Medicine

## 2022-08-28 NOTE — Telephone Encounter (Signed)
Requested Prescriptions  Pending Prescriptions Disp Refills   rosuvastatin (CRESTOR) 10 MG tablet [Pharmacy Med Name: ROSUVASTATIN CALCIUM 10 MG TAB] 90 tablet 3    Sig: TAKE 1 TABLET BY MOUTH EVERY DAY     Cardiovascular:  Antilipid - Statins 2 Failed - 08/27/2022  6:19 PM      Failed - Cr in normal range and within 360 days    Creat  Date Value Ref Range Status  04/18/2022 1.61 (H) 0.50 - 1.05 mg/dL Final   Creatinine, Urine  Date Value Ref Range Status  04/18/2022 23 20 - 275 mg/dL Final         Failed - Lipid Panel in normal range within the last 12 months    Cholesterol  Date Value Ref Range Status  03/29/2022 178 <200 mg/dL Final   LDL Cholesterol (Calc)  Date Value Ref Range Status  03/29/2022 74 mg/dL (calc) Final    Comment:    Reference range: <100 . Desirable range <100 mg/dL for primary prevention;   <70 mg/dL for patients with CHD or diabetic patients  with > or = 2 CHD risk factors. Marland Kitchen LDL-C is now calculated using the Martin-Hopkins  calculation, which is a validated novel method providing  better accuracy than the Friedewald equation in the  estimation of LDL-C.  Cresenciano Genre et al. Annamaria Helling. 4268;341(96): 2061-2068  (http://education.[employer].com/faq/FAQ164)    Direct LDL  Date Value Ref Range Status  03/27/2017 159 (H) <100 mg/dL Final    Comment:    ** Please note change in reference range(s). **     Desirable range <100 mg/dL for patients with CHD or diabetes and <70 mg/dL for diabetic patients with known heart disease.      HDL  Date Value Ref Range Status  03/29/2022 88 > OR = 50 mg/dL Final   Triglycerides  Date Value Ref Range Status  03/29/2022 77 <150 mg/dL Final         Passed - Patient is not pregnant      Passed - Valid encounter within last 12 months    Recent Outpatient Visits           10 months ago Type 2 diabetes mellitus without complication, without long-term current use of insulin ([place])   [place] Pickard, Cammie Mcgee, MD   1 year ago Type 2 diabetes mellitus without complication, without long-term current use of insulin ([place])   [place] Susy Frizzle, MD   1 year ago Stage 3 chronic kidney disease, unspecified whether stage 3a or 3b CKD ([place])   [place] Susy Frizzle, MD   2 years ago Benign paroxysmal positional vertigo of right ear   [place], Warren T, MD   2 years ago Subacromial bursitis of left shoulder joint   [place] Pickard, Cammie Mcgee, MD

## 2022-09-04 ENCOUNTER — Encounter: Payer: Self-pay | Admitting: Family Medicine

## 2022-09-15 ENCOUNTER — Other Ambulatory Visit: Payer: Self-pay | Admitting: Family Medicine

## 2022-09-24 ENCOUNTER — Encounter: Payer: Self-pay | Admitting: Family Medicine

## 2022-09-25 ENCOUNTER — Other Ambulatory Visit: Payer: Self-pay

## 2022-09-25 DIAGNOSIS — I10 Essential (primary) hypertension: Secondary | ICD-10-CM

## 2022-09-25 MED ORDER — AMLODIPINE BESYLATE 5 MG PO TABS: 5.0000 mg | ORAL_TABLET | Freq: Every day | ORAL | 3 refills | Status: DC

## 2022-10-01 ENCOUNTER — Encounter: Payer: Self-pay | Admitting: Family Medicine

## 2022-10-01 ENCOUNTER — Ambulatory Visit: Payer: Medicare PPO | Admitting: Family Medicine

## 2022-10-03 ENCOUNTER — Other Ambulatory Visit: Payer: Self-pay | Admitting: Family Medicine

## 2022-10-03 ENCOUNTER — Ambulatory Visit: Payer: Medicare PPO | Admitting: Family Medicine

## 2022-10-03 MED ORDER — ALPRAZOLAM 0.5 MG PO TABS: 0.5000 mg | ORAL_TABLET | Freq: Every evening | ORAL | 0 refills | Status: DC | PRN

## 2022-10-15 ENCOUNTER — Other Ambulatory Visit: Payer: Self-pay

## 2022-10-15 ENCOUNTER — Other Ambulatory Visit: Payer: Self-pay | Admitting: Family Medicine

## 2022-10-15 ENCOUNTER — Encounter: Payer: Self-pay | Admitting: Family Medicine

## 2022-10-15 ENCOUNTER — Ambulatory Visit: Payer: Medicare PPO | Admitting: Family Medicine

## 2022-10-15 DIAGNOSIS — E119 Type 2 diabetes mellitus without complications: Secondary | ICD-10-CM

## 2022-10-15 MED ORDER — BLOOD GLUCOSE MONITORING SUPPL SUPPLIES MISC: 5 refills | Status: DC

## 2022-10-15 NOTE — Telephone Encounter (Signed)
Requested medications are due for refill today.  Unsure  Requested medications are on the active medications list.  yes  Last refill.   Future visit scheduled.   yes  Notes to clinic.  Pharmacy comment: Alternative Requested:INS PREFERS ACCU CHECK.     Requested Prescriptions  Pending Prescriptions Disp Refills   ONETOUCH VERIO test strip [Pharmacy Med Name: ONE TOUCH VERIO TEST STRIP] 100 strip 5    Sig: USE TO CHECK BLOOD GLUCOSE TWO TIMES PER DAY.     Endocrinology: Diabetes - Testing Supplies Passed - 10/15/2022  1:16 PM      Passed - Valid encounter within last 12 months    Recent Outpatient Visits           11 months ago Type 2 diabetes mellitus without complication, without long-term current use of insulin ([place])   [place] Pickard, Cammie Mcgee, MD   1 year ago Type 2 diabetes mellitus without complication, without long-term current use of insulin ([place])   [place] Susy Frizzle, MD   2 years ago Stage 3 chronic kidney disease, unspecified whether stage 3a or 3b CKD ([place])   [place] Susy Frizzle, MD   2 years ago Benign paroxysmal positional vertigo of right ear   [place] Susy Frizzle, MD   2 years ago Subacromial bursitis of left shoulder joint   [place] Pickard, Cammie Mcgee, MD       Future Appointments             In 1 week Pickard, Cammie Mcgee, MD [place]

## 2022-10-15 NOTE — Telephone Encounter (Signed)
Requested medication (s) are due for refill today: see note to clinic  Requested medication (s) are on the active medication list: na   Last refill:  na   Future visit scheduled: yes in 1 week   Notes to clinic:   pharmacy requesting new order for accu check brand : due to insurance coverage will not cover one touch verio any longer. Please order blood glucose monitoring kit accu check with device kit and supplies test strips.      Requested Prescriptions  Pending Prescriptions Disp Refills   ONETOUCH VERIO test strip [Pharmacy Med Name: ONE TOUCH VERIO TEST STRIP] 100 strip 5    Sig: USE TO CHECK BLOOD GLUCOSE TWO TIMES PER DAY.     Endocrinology: Diabetes - Testing Supplies Passed - 10/15/2022  1:16 PM      Passed - Valid encounter within last 12 months    Recent Outpatient Visits           11 months ago Type 2 diabetes mellitus without complication, without long-term current use of insulin ([place])   [place] Pickard, Cammie Mcgee, MD   1 year ago Type 2 diabetes mellitus without complication, without long-term current use of insulin ([place])   [place] Susy Frizzle, MD   2 years ago Stage 3 chronic kidney disease, unspecified whether stage 3a or 3b CKD ([place])   [place] Susy Frizzle, MD   2 years ago Benign paroxysmal positional vertigo of right ear   [place] Susy Frizzle, MD   2 years ago Subacromial bursitis of left shoulder joint   [place] Pickard, Cammie Mcgee, MD       Future Appointments             In 1 week Pickard, Cammie Mcgee, MD [place]

## 2022-10-15 NOTE — Telephone Encounter (Signed)
Called pharmacy to review medication request. Pharmacy staff  reports patient insurance approves accu check and will need new script for test strips, lancets and kit.

## 2022-10-15 NOTE — Telephone Encounter (Signed)
Requested medication (s) are due for refill today:   Yes  Requested medication (s) are on the active medication list:   Yes  Future visit scheduled:   Yes 10/24/2022   Last ordered: Insurance prefers Accu Check test strips reason returned  Requested Prescriptions  Pending Prescriptions Disp Refills   ONETOUCH VERIO test strip [Pharmacy Med Name: ONE TOUCH VERIO TEST STRIP] 100 strip 5    Sig: USE TO CHECK BLOOD GLUCOSE TWO TIMES PER DAY.     Endocrinology: Diabetes - Testing Supplies Passed - 10/15/2022  1:16 PM      Passed - Valid encounter within last 12 months    Recent Outpatient Visits           11 months ago Type 2 diabetes mellitus without complication, without long-term current use of insulin ([place])   [place] Pickard, Cammie Mcgee, MD   1 year ago Type 2 diabetes mellitus without complication, without long-term current use of insulin ([place])   [place] Susy Frizzle, MD   2 years ago Stage 3 chronic kidney disease, unspecified whether stage 3a or 3b CKD ([place])   [place] Susy Frizzle, MD   2 years ago Benign paroxysmal positional vertigo of right ear   [place] Susy Frizzle, MD   2 years ago Subacromial bursitis of left shoulder joint   [place] Pickard, Cammie Mcgee, MD       Future Appointments             In 1 week Pickard, Cammie Mcgee, MD [place]

## 2022-10-16 ENCOUNTER — Other Ambulatory Visit: Payer: Self-pay | Admitting: Family Medicine

## 2022-10-16 DIAGNOSIS — E119 Type 2 diabetes mellitus without complications: Secondary | ICD-10-CM

## 2022-10-16 NOTE — Telephone Encounter (Signed)
Requested medication (s) are due for refill today:Alternative Requested:NOT COVERED PLEASE ADVISE.   Requested medication (s) are on the active medication list: yes  Last refill:  10/16/22  Future visit scheduled:yes  Notes to clinic:  Alternative Requested:NOT COVERED PLEASE ADVISE.      Requested Prescriptions  Pending Prescriptions Disp Refills   ONETOUCH VERIO test strip [Pharmacy Med Name: ONE TOUCH VERIO TEST STRIP] 100 strip 5    Sig: USE TO CHECK BLOOD GLUCOSE TWO TIMES PER DAY.     Endocrinology: Diabetes - Testing Supplies Passed - 10/16/2022  2:05 PM      Passed - Valid encounter within last 12 months    Recent Outpatient Visits           11 months ago Type 2 diabetes mellitus without complication, without long-term current use of insulin ([place])   [place] Pickard, Cammie Mcgee, MD   1 year ago Type 2 diabetes mellitus without complication, without long-term current use of insulin ([place])   [place] Susy Frizzle, MD   2 years ago Stage 3 chronic kidney disease, unspecified whether stage 3a or 3b CKD ([place])   [place] Susy Frizzle, MD   2 years ago Benign paroxysmal positional vertigo of right ear   [place] Susy Frizzle, MD   2 years ago Subacromial bursitis of left shoulder joint   [place] Pickard, Cammie Mcgee, MD       Future Appointments             In 1 week Pickard, Cammie Mcgee, MD [place]

## 2022-10-17 ENCOUNTER — Other Ambulatory Visit: Payer: Self-pay

## 2022-10-17 DIAGNOSIS — E119 Type 2 diabetes mellitus without complications: Secondary | ICD-10-CM

## 2022-10-17 MED ORDER — BLOOD GLUCOSE METER KIT: PACK | 0 refills | Status: DC

## 2022-10-24 ENCOUNTER — Ambulatory Visit: Payer: Medicare PPO | Admitting: Family Medicine

## 2022-10-29 ENCOUNTER — Encounter: Payer: Self-pay | Admitting: Family Medicine

## 2022-10-29 ENCOUNTER — Ambulatory Visit: Payer: Medicare PPO | Admitting: Family Medicine

## 2022-10-29 ENCOUNTER — Telehealth: Payer: Self-pay

## 2022-10-29 VITALS — BP 132/82 | HR 70 | Temp 98.5°F | Ht 59.0 in | Wt 135.2 lb

## 2022-10-29 DIAGNOSIS — N183 Chronic kidney disease, stage 3 unspecified: Secondary | ICD-10-CM | POA: Diagnosis not present

## 2022-10-29 DIAGNOSIS — E119 Type 2 diabetes mellitus without complications: Secondary | ICD-10-CM | POA: Diagnosis not present

## 2022-10-29 MED ORDER — METRONIDAZOLE 1 % EX GEL: Freq: Every day | CUTANEOUS | 3 refills | Status: DC

## 2022-10-29 NOTE — Progress Notes (Signed)
Subjective:    Patient ID: Tracy Price, female    DOB: [birth year], 67 y.o.   MRN: [id number]  Depression       Rash  Patient has a patch of erythema on her right cheek roughly the size of a quarter.  It does not itch or burn.  There is no raised portion.  It is not palpable.  There is no scaling.  She does have a history of rosacea.  I believe is likely localized patch of rosacea versus an early actinic keratosis.  She states that it has been there for 6 months.  Her blood pressure today is well-controlled.  She denies any chest pain shortness of breath or dyspnea on exertion.  She does have chronic kidney disease and is currently on Farxiga for diabetes as well as this.  She would like to try Ozempic for weight loss.  She is concerned about her weight although her BMI is reassuring at 25.5. 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 Take with a full glass of water on an empty stomach. 4 tablet 11   ALPRAZolam (XANAX) 0.5 MG tablet Take 1 tablet (0.5 mg total) by mouth at bedtime as needed for anxiety (90 day supply). 90 tablet 0   amLODipine (NORVASC) 5 MG tablet Take 1 tablet (5 mg total) by mouth daily. 90 tablet 3   blood glucose meter kit and supplies Dispense based on  patient and insurance preference. Use up to four times daily as directed. (FOR ICD-10 E10.9, E11.9). 1 each 0   calcium carbonate (OSCAL) 1500 (600 Ca) MG TABS tablet Take by mouth 2 (two) times daily with a meal.     cholecalciferol (VITAMIN D3) 25 MCG (1000 UNIT) tablet Take 1,000 Units by mouth daily.     co-enzyme Q-10 30 MG capsule Take 30 mg by mouth 3 (three) times daily.     dapagliflozin propanediol (FARXIGA) 10 MG TABS tablet Take 1 tablet (10 mg total) by mouth daily before breakfast. 90 tablet 3   hydrochlorothiazide (MICROZIDE) 12.5 MG capsule TAKE 1 CAPSULE BY MOUTH EVERY DAY 90 capsule 3   losartan (COZAAR) 100 MG tablet Take 1 tablet (100 mg total) by mouth daily. 90 tablet 3   Melatonin 5 MG TABS Take by mouth.     pantoprazole (PROTONIX) 40 MG tablet TAKE 1 TABLET BY MOUTH TWICE A DAY BEFORE A MEAL 180 tablet 3   rosuvastatin (CRESTOR) 10 MG tablet TAKE 1 TABLET BY MOUTH EVERY DAY 90 tablet 1   [place] Wort 150 MG CAPS Take by mouth.     traMADol (ULTRAM) 50 MG tablet  Take 1 tablet (50 mg total) by mouth 3 (three) times daily as needed. 180 tablet 3   No current facility-administered medications on file prior to visit.   No Known Allergies  Social History   Socioeconomic History   Marital status: Single    Spouse name: Not on file   Number of children: Not on file   Years of education: Not on file   Highest education level: Not on file  Occupational History   Not on file  Tobacco Use   Smoking status: Never   Smokeless tobacco: Never  Vaping Use   Vaping Use: Never used  Substance and Sexual Activity   Alcohol use: No   Drug use: No   Sexual activity: Not Currently  Other Topics Concern   Not on file  Social History Narrative   Not on file   Social Determinants of Health   Financial Resource Strain: Not on file  Food Insecurity: Not on file  Transportation Needs: Not on file  Physical Activity: Not on file  Stress: Not on file  Social Connections:  Not on file  Intimate Partner Violence: Not on file      Review of Systems  Skin:  Positive for rash.  Psychiatric/Behavioral:  Positive for depression.        Objective:   Physical Exam Vitals reviewed.  Constitutional:      Appearance: Normal appearance. She is normal weight.  Cardiovascular:     Rate and Rhythm: Normal rate and regular rhythm.     Heart sounds: Normal heart sounds. No murmur heard.    No gallop.  Pulmonary:     Effort: Pulmonary effort is normal. No respiratory distress.     Breath sounds: Normal breath sounds. No stridor. No wheezing, rhonchi or rales.  Abdominal:     General: Abdomen is flat. Bowel sounds are normal.     Palpations: Abdomen is soft.  Musculoskeletal:     Right lower leg: No edema.     Left lower leg: No edema.  Neurological:     Mental Status: She is alert.           Assessment & Plan:  Type 2 diabetes mellitus without complication, without long-term current use of insulin (HCC) - Plan: Hemoglobin A1c, CBC with Differential/Platelet, Lipid panel, COMPLETE METABOLIC PANEL WITH GFR, Protein / Creatinine Ratio, Urine  Stage 3 chronic kidney disease, unspecified whether stage 3a or 3b CKD ([place]) - Plan: Hemoglobin A1c, CBC with Differential/Platelet, Lipid panel, COMPLETE METABOLIC PANEL WITH GFR, Protein / Creatinine Ratio, Urine Recheck BMP today to monitor creatinine along with a urine protein to creatinine ratio as well as a hemoglobin A1c.  Goal A1c is less than 6.5.  If greater than 6.5) add Ozempic.  Recommended continuing [place] for renal protection.  Check fasting lipid panel.  Goal LDL cholesterol is less than 100.  Gave the patient MetroGel to try daily on the patch of erythema on her cheek.  I believe this is likely rosacea.  If it grows or changes I would recommend a biopsy

## 2022-10-29 NOTE — Telephone Encounter (Signed)
Fax received from [facility]. Pt 's Wilder Glade PA was approved. Copy scanned into patient's chart. Mjp,lpn

## 2022-10-30 ENCOUNTER — Encounter: Payer: Self-pay | Admitting: Family Medicine

## 2022-10-30 ENCOUNTER — Other Ambulatory Visit: Payer: Self-pay

## 2022-10-30 DIAGNOSIS — N183 Chronic kidney disease, stage 3 unspecified: Secondary | ICD-10-CM

## 2022-10-30 DIAGNOSIS — E119 Type 2 diabetes mellitus without complications: Secondary | ICD-10-CM

## 2022-10-30 LAB — CBC WITH DIFFERENTIAL/PLATELET
Absolute Monocytes: 773 cells/uL (ref 200–950)
Basophils Absolute: 98 cells/uL (ref 0–200)
Basophils Relative: 1.3 %
Eosinophils Absolute: 435 cells/uL (ref 15–500)
Eosinophils Relative: 5.8 %
HCT: 34.6 % — ABNORMAL LOW (ref 35.0–45.0)
Hemoglobin: 11 g/dL — ABNORMAL LOW (ref 11.7–15.5)
Lymphs Abs: 1830 cells/uL (ref 850–3900)
MCH: 25.8 pg — ABNORMAL LOW (ref 27.0–33.0)
MCHC: 31.8 g/dL — ABNORMAL LOW (ref 32.0–36.0)
MCV: 81 fL (ref 80.0–100.0)
MPV: 10.7 fL (ref 7.5–12.5)
Monocytes Relative: 10.3 %
Neutro Abs: 4365 cells/uL (ref 1500–7800)
Neutrophils Relative %: 58.2 %
Platelets: 283 10*3/uL (ref 140–400)
RBC: 4.27 10*6/uL (ref 3.80–5.10)
RDW: 14.6 % (ref 11.0–15.0)
Total Lymphocyte: 24.4 %
WBC: 7.5 10*3/uL (ref 3.8–10.8)

## 2022-10-30 LAB — PROTEIN / CREATININE RATIO, URINE
Creatinine, Urine: 19 mg/dL — ABNORMAL LOW (ref 20–275)
Protein/Creat Ratio: 526 mg/g creat — ABNORMAL HIGH (ref 24–184)
Protein/Creatinine Ratio: 0.526 mg/mg creat — ABNORMAL HIGH (ref 0.024–0.184)
Total Protein, Urine: 10 mg/dL (ref 5–24)

## 2022-10-30 LAB — COMPLETE METABOLIC PANEL WITH GFR
AG Ratio: 1.6 (calc) (ref 1.0–2.5)
ALT: 30 U/L — ABNORMAL HIGH (ref 6–29)
AST: 25 U/L (ref 10–35)
Albumin: 4.3 g/dL (ref 3.6–5.1)
Alkaline phosphatase (APISO): 96 U/L (ref 37–153)
BUN/Creatinine Ratio: 19 (calc) (ref 6–22)
BUN: 25 mg/dL (ref 7–25)
CO2: 27 mmol/L (ref 20–32)
Calcium: 8.6 mg/dL (ref 8.6–10.4)
Chloride: 97 mmol/L — ABNORMAL LOW (ref 98–110)
Creat: 1.35 mg/dL — ABNORMAL HIGH (ref 0.50–1.05)
Globulin: 2.7 g/dL (calc) (ref 1.9–3.7)
Glucose, Bld: 132 mg/dL — ABNORMAL HIGH (ref 65–99)
Potassium: 4.1 mmol/L (ref 3.5–5.3)
Sodium: 135 mmol/L (ref 135–146)
Total Bilirubin: 0.5 mg/dL (ref 0.2–1.2)
Total Protein: 7 g/dL (ref 6.1–8.1)
eGFR: 44 mL/min/{1.73_m2} — ABNORMAL LOW (ref >=60)

## 2022-10-30 LAB — LIPID PANEL
Cholesterol: 181 mg/dL (ref <200)
HDL: 94 mg/dL (ref >=50)
LDL Cholesterol (Calc): 73 mg/dL (calc)
Non-HDL Cholesterol (Calc): 87 mg/dL (calc) (ref <130)
Total CHOL/HDL Ratio: 1.9 (calc) (ref <5.0)
Triglycerides: 64 mg/dL (ref <150)

## 2022-10-30 LAB — HEMOGLOBIN A1C
Hgb A1c MFr Bld: 7.2 % of total Hgb — ABNORMAL HIGH (ref <5.7)
Mean Plasma Glucose: 160 mg/dL
eAG (mmol/L): 8.9 mmol/L

## 2022-10-30 MED ORDER — MOUNJARO 2.5 MG/0.5ML ~~LOC~~ SOAJ: 2.5000 mg | SUBCUTANEOUS | 3 refills | Status: DC

## 2022-10-31 ENCOUNTER — Telehealth: Payer: Self-pay

## 2022-10-31 ENCOUNTER — Encounter: Payer: Self-pay | Admitting: Family Medicine

## 2022-10-31 NOTE — Telephone Encounter (Signed)
Pt would called stating that she is always tired. Pt stated that she has tried the pill form for Iron, however, pt said that it upsets her stomach. Would like to know if she can get a iron injections?  Pls advice.

## 2022-11-01 NOTE — Telephone Encounter (Signed)
Spoke with pt and pcp's recommendations re: iron. Per pt voiced understanding and nothing further.

## 2022-11-12 ENCOUNTER — Encounter: Payer: Self-pay | Admitting: Family Medicine

## 2022-11-14 ENCOUNTER — Other Ambulatory Visit: Payer: Self-pay | Admitting: Family Medicine

## 2022-11-14 MED ORDER — TIRZEPATIDE 5 MG/0.5ML ~~LOC~~ SOAJ: 5.0000 mg | SUBCUTANEOUS | 1 refills | Status: DC

## 2022-12-01 ENCOUNTER — Encounter: Payer: Self-pay | Admitting: Family Medicine

## 2022-12-05 ENCOUNTER — Ambulatory Visit: Payer: Medicare PPO | Admitting: Family Medicine

## 2022-12-05 ENCOUNTER — Encounter: Payer: Self-pay | Admitting: Family Medicine

## 2022-12-05 VITALS — BP 110/72 | HR 111 | Temp 97.8°F | Ht 59.0 in | Wt 122.4 lb

## 2022-12-05 DIAGNOSIS — F988 Other specified behavioral and emotional disorders with onset usually occurring in childhood and adolescence: Secondary | ICD-10-CM

## 2022-12-05 MED ORDER — AMPHETAMINE-DEXTROAMPHET ER 10 MG PO CP24: 10.0000 mg | ORAL_CAPSULE | Freq: Every day | ORAL | 0 refills | Status: DC

## 2022-12-05 NOTE — Progress Notes (Signed)
Subjective:    Patient ID: Tracy Price, female    DOB: 1956/09/02, 67 y.o.   MRN: [id number]   Patient states that she was diagnosed with ADD more than 20 years ago.  She went to a psychiatrist at that time.  She was very embarrassed.  She states that she had a difficult time maintaining focus.  She would start several different task.  She would easily become distracted.  She would then fail to complete any of those task.  Was having a difficult time at work maintaining focus and completing assignments.  She will find herself easily distracted.  She states that she was start several different things simultaneously but never complete them.  She had a difficult time listening to people.  She had a difficult time with organization and follow-through.  She was started on Ritalin and her symptoms improved.  She took Ritalin for years.  However after she retired, she stopped Ritalin.  I never prescribed this to her.  She states that over the last several months, she has become increasingly aware of her difficulty with maintaining her focus.  For instance she states that she is losing items around her home.  She forgets where she puts things.  She has problems with organization.  She states that she is started back in her old habits of starting multiple different projects simultaneously and then failing to complete them.  Her family has noticed this and has her very embarrassed.  She states that her family members want to review her unanswered information and papers however she was easily Jennette Bill and she is embarrassed to do this with them.  She states that she wants to start working again but she knows she will be unable to perform a job with her inability to focus.  She denies any short-term memory problems.  She denies forgetting conversations.  She denies losing money or getting lost driving.  She denies any depression.  She denies any mood swings.  We also discussed the risk of stimulants at age 78 and a  diabetic however she states that she is so embarrassed and having such a difficult time coping that she would be willing to assume risk if the medication would help her focus. 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 Take with a full glass of water on an empty stomach. 4 tablet 11   ALPRAZolam (XANAX) 0.5 MG tablet Take 1 tablet (0.5 mg total) by mouth at bedtime as needed for anxiety (90 day supply). 90 tablet 0   amLODipine (NORVASC) 5 MG tablet Take 1 tablet (5 mg total) by mouth daily. 90 tablet  3   blood glucose meter kit and supplies Dispense based on patient and insurance preference. Use up to four times daily as directed. (FOR ICD-10 E10.9, E11.9). 1 each 0   calcium carbonate (OSCAL) 1500 (600 Ca) MG TABS tablet Take by mouth 2 (two) times daily with a meal.     cholecalciferol (VITAMIN D3) 25 MCG (1000 UNIT) tablet Take 1,000 Units by mouth daily.     co-enzyme Q-10 30 MG capsule Take 30 mg by mouth 3 (three) times daily.     dapagliflozin propanediol (FARXIGA) 10 MG TABS tablet Take 1 tablet (10 mg total) by mouth daily before breakfast. 90 tablet 3   hydrochlorothiazide (MICROZIDE) 12.5 MG capsule TAKE 1 CAPSULE BY MOUTH EVERY DAY 90 capsule 3   losartan (COZAAR) 100 MG  tablet Take 1 tablet (100 mg total) by mouth daily. 90 tablet 3   Melatonin 5 MG TABS Take by mouth.     metroNIDAZOLE (METROGEL) 1 % gel Apply topically daily. 45 g 3   pantoprazole (PROTONIX) 40 MG tablet TAKE 1 TABLET BY MOUTH TWICE A DAY BEFORE A MEAL 180 tablet 3   rosuvastatin (CRESTOR) 10 MG tablet TAKE 1 TABLET BY MOUTH EVERY DAY 90 tablet 1   tirzepatide (MOUNJARO) 5 MG/0.5ML Pen Inject 5 mg into the skin once a week. 6 mL 1   traMADol (ULTRAM) 50 MG tablet Take 1 tablet (50 mg total) by mouth 3 (three) times daily as needed. 180 tablet 3   No current facility-administered medications on file prior to visit.   No Known Allergies  Social History   Socioeconomic History   Marital status: Single    Spouse name: Not on file   Number of children: Not on file   Years of education: Not on file   Highest education level: Not on file  Occupational History   Not on file  Tobacco Use   Smoking status: Never   Smokeless tobacco: Never  Vaping Use   Vaping Use: Never used  Substance and Sexual Activity   Alcohol use: No   Drug use: No   Sexual activity: Not Currently  Other Topics Concern   Not on file  Social History Narrative   Not on file   Social Determinants of Health   Financial Resource Strain: Not on file  Food Insecurity: Not on file  Transportation Needs: Not on file  Physical Activity: Not on file  Stress: Not on file  Social Connections: Not on file  Intimate Partner Violence: Not on file      Review of Systems  Skin:  Positive for rash.       Objective:   Physical Exam Vitals reviewed.  Constitutional:      Appearance: Normal appearance. She is normal weight.  Cardiovascular:     Rate and Rhythm: Normal rate and regular rhythm.     Heart sounds: Normal heart sounds. No murmur heard.    No gallop.  Pulmonary:     Effort: Pulmonary effort is normal. No respiratory distress.     Breath sounds: Normal breath sounds. No stridor. No wheezing,  rhonchi or rales.  Abdominal:     General: Abdomen is flat. Bowel sounds are normal.     Palpations: Abdomen is soft.  Musculoskeletal:     Right lower leg: No edema.     Left lower leg: No edema.  Neurological:     Mental Status: She is alert.  Assessment & Plan:  Attention deficit disorder (ADD) without hyperactivity Patient has ADD.  She has suffered with this for more than 30 years.  Now she is half-time trying to get a new job.  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  She feels like she needs this medication to help with her focus.  Therefore we have decided to start Adderall XR 10 mg daily and reassess in 1 month

## 2022-12-06 ENCOUNTER — Encounter: Payer: Self-pay | Admitting: Family Medicine

## 2022-12-12 ENCOUNTER — Encounter: Payer: Self-pay | Admitting: Family Medicine

## 2022-12-12 ENCOUNTER — Other Ambulatory Visit: Payer: Self-pay | Admitting: Family Medicine

## 2022-12-16 ENCOUNTER — Other Ambulatory Visit: Payer: Self-pay | Admitting: Family Medicine

## 2022-12-16 MED ORDER — AMPHETAMINE-DEXTROAMPHET ER 20 MG PO CP24: 20.0000 mg | ORAL_CAPSULE | Freq: Every day | ORAL | 0 refills | Status: DC

## 2022-12-17 ENCOUNTER — Encounter: Payer: Self-pay | Admitting: Family Medicine

## 2022-12-19 ENCOUNTER — Encounter: Payer: Self-pay | Admitting: Family Medicine

## 2022-12-19 ENCOUNTER — Telehealth: Payer: Self-pay | Admitting: Family Medicine

## 2022-12-19 NOTE — Telephone Encounter (Signed)
Prescription Request  12/19/2022  LOV: 12/05/2022  What is the name of the medication or equipment?   dapagliflozin propanediol (FARXIGA) 10 MG TABS tablet  **PATIENT STATED [facility] NEEDS A NEW TIER ACCEPTION TO HONOR REFILL REQUEST; STATED IT WAS PREVIOUSLY DONE JULY OF LAST YEAR. [facility] WILL FAX THE FORM THEY NEED**  Have you contacted your pharmacy to request a refill? Yes   Which pharmacy would you like this sent to? Patient notified that their request is being sent to the clinical staff for review and that they should receive a response within 2 business days.   Please advise pharmacist.

## 2022-12-20 ENCOUNTER — Encounter: Payer: Self-pay | Admitting: Family Medicine

## 2022-12-30 ENCOUNTER — Telehealth: Payer: Self-pay

## 2022-12-30 NOTE — Telephone Encounter (Signed)
MY CHART MESSAGE FROM PATIENT:  Also I'm on the second box of my 5 Mounjaro  No problems so far until last week   Last week 2nd shot of 2nd month of 5 ml I became very nauseous from Thurs and still feel it some now   No vomiting or diarrhea just indigestion and  upset stomach   Just couldn't eat much and I'm still on a bland diet   My QUESTION:  Do you think I could skip this week shot to try and feel a little better and start back next week?  Or do you think if I skip this week next week shot will be worse?   Do you think I should still go on and take the shot?   It's not unbearable  But absolutely shocked  me and I  didn't expect it since I've already taken 1 box of 5 ml with no issues at all  I've tolerated it very well so far up until last week

## 2023-01-01 ENCOUNTER — Telehealth: Payer: Self-pay

## 2023-01-01 NOTE — Telephone Encounter (Signed)
PA and tier exception request has been approved from 10/07/2022-10/07/2023 per fax from [facility]. Pt advised.

## 2023-01-02 ENCOUNTER — Other Ambulatory Visit: Payer: Self-pay | Admitting: Family Medicine

## 2023-01-02 DIAGNOSIS — S065X0S Traumatic subdural hemorrhage without loss of consciousness, sequela: Secondary | ICD-10-CM

## 2023-01-02 DIAGNOSIS — G8929 Other chronic pain: Secondary | ICD-10-CM

## 2023-01-02 MED ORDER — TRAMADOL HCL 50 MG PO TABS: 50.0000 mg | ORAL_TABLET | Freq: Three times a day (TID) | ORAL | 0 refills | Status: DC | PRN

## 2023-01-09 ENCOUNTER — Encounter: Payer: Self-pay | Admitting: Family Medicine

## 2023-01-14 ENCOUNTER — Other Ambulatory Visit: Payer: Self-pay

## 2023-01-17 ENCOUNTER — Other Ambulatory Visit: Payer: Self-pay | Admitting: Family Medicine

## 2023-01-17 ENCOUNTER — Encounter: Payer: Self-pay | Admitting: Family Medicine

## 2023-01-17 MED ORDER — ALPRAZOLAM 0.5 MG PO TABS: 0.5000 mg | ORAL_TABLET | Freq: Every evening | ORAL | 0 refills | Status: DC | PRN

## 2023-01-17 NOTE — Telephone Encounter (Signed)
Requested medication (s) are due for refill today - yes  Requested medication (s) are on the active medication list -yes  Future visit scheduled -yes  Last refill: 10/03/22 #90  Notes to clinic: non delegated Rx  Requested Prescriptions  Pending Prescriptions Disp Refills   ALPRAZolam (XANAX) 0.5 MG tablet [Pharmacy Med Name: ALPRAZOLAM 0.5 MG TABLET] 30 tablet     Sig: TAKE 1 TABLET (0.5 MG TOTAL) BY MOUTH 3 (THREE) TIMES DAILY AS NEEDED FOR ANXIETY (90 DAY SUPPLY).     Not Delegated - Psychiatry: Anxiolytics/Hypnotics 2 Failed - 01/17/2023 11:51 AM      Failed - This refill cannot be delegated      Failed - Urine Drug Screen completed in last 360 days      Failed - Valid encounter within last 6 months    Recent Outpatient Visits           1 year ago Type 2 diabetes mellitus without complication, without long-term current use of insulin (HCC)   [facility] Pickard, Priscille Heidelberg, MD   1 year ago Type 2 diabetes mellitus without complication, without long-term current use of insulin (HCC)   [facility] Donita Brooks, MD   2 years ago Stage 3 chronic kidney disease, unspecified whether stage 3a or 3b CKD (HCC)   [facility] Donita Brooks, MD   2 years ago Benign paroxysmal positional vertigo of right ear   [facility] Donita Brooks, MD   2 years ago Subacromial bursitis of left shoulder joint   [facility] Pickard, Priscille Heidelberg, MD       Future Appointments             In 2 months Pickard, Priscille Heidelberg, MD [facility], [facility]            Passed - Patient is not pregnant         Requested Prescriptions  Pending Prescriptions Disp Refills   ALPRAZolam (XANAX) 0.5 MG tablet [Pharmacy Med Name: ALPRAZOLAM 0.5 MG TABLET] 30 tablet     Sig: TAKE 1 TABLET (0.5 MG TOTAL) BY MOUTH 3 (THREE) TIMES DAILY AS NEEDED FOR ANXIETY (90 DAY SUPPLY).     Not Delegated -  Psychiatry: Anxiolytics/Hypnotics 2 Failed - 01/17/2023 11:51 AM      Failed - This refill cannot be delegated      Failed - Urine Drug Screen completed in last 360 days      Failed - Valid encounter within last 6 months    Recent Outpatient Visits           1 year ago Type 2 diabetes mellitus without complication, without long-term current use of insulin (HCC)   [facility] Pickard, Priscille Heidelberg, MD   1 year ago Type 2 diabetes mellitus without complication, without long-term current use of insulin (HCC)   [facility] Donita Brooks, MD   2 years ago Stage 3 chronic kidney disease, unspecified whether stage 3a or 3b CKD (HCC)   [facility] Donita Brooks, MD   2 years ago Benign paroxysmal positional vertigo of right ear   [facility] Donita Brooks, MD   2 years ago Subacromial bursitis of left shoulder joint   [facility] Pickard, Priscille Heidelberg, MD       Future Appointments  In 2 months Pickard, Priscille Heidelberg, MD [facility], [facility]            Passed - Patient is not pregnant

## 2023-02-04 ENCOUNTER — Encounter: Payer: Self-pay | Admitting: Family Medicine

## 2023-02-05 ENCOUNTER — Other Ambulatory Visit: Payer: Self-pay

## 2023-02-05 DIAGNOSIS — E119 Type 2 diabetes mellitus without complications: Secondary | ICD-10-CM

## 2023-02-05 DIAGNOSIS — I10 Essential (primary) hypertension: Secondary | ICD-10-CM

## 2023-02-05 DIAGNOSIS — N183 Chronic kidney disease, stage 3 unspecified: Secondary | ICD-10-CM

## 2023-02-05 MED ORDER — TIRZEPATIDE 5 MG/0.5ML ~~LOC~~ SOAJ: 5.0000 mg | SUBCUTANEOUS | 1 refills | Status: DC

## 2023-02-14 ENCOUNTER — Encounter: Payer: Self-pay | Admitting: Family Medicine

## 2023-02-14 ENCOUNTER — Other Ambulatory Visit: Payer: Self-pay | Admitting: Family Medicine

## 2023-02-14 MED ORDER — ONDANSETRON HCL 4 MG PO TABS: 4.0000 mg | ORAL_TABLET | Freq: Three times a day (TID) | ORAL | 0 refills | Status: DC | PRN

## 2023-02-16 ENCOUNTER — Encounter: Payer: Self-pay | Admitting: Family Medicine

## 2023-02-25 ENCOUNTER — Ambulatory Visit: Payer: Medicare PPO | Admitting: Family Medicine

## 2023-02-26 ENCOUNTER — Encounter: Payer: Self-pay | Admitting: Family Medicine

## 2023-02-27 ENCOUNTER — Other Ambulatory Visit: Payer: Self-pay | Admitting: Family Medicine

## 2023-02-27 DIAGNOSIS — G8929 Other chronic pain: Secondary | ICD-10-CM

## 2023-02-27 DIAGNOSIS — S065X0S Traumatic subdural hemorrhage without loss of consciousness, sequela: Secondary | ICD-10-CM

## 2023-02-27 MED ORDER — AMPHETAMINE-DEXTROAMPHET ER 20 MG PO CP24: 20.0000 mg | ORAL_CAPSULE | Freq: Every day | ORAL | 0 refills | Status: DC

## 2023-02-27 MED ORDER — TRAMADOL HCL 50 MG PO TABS
50.0000 mg | ORAL_TABLET | Freq: Three times a day (TID) | ORAL | 0 refills | Status: DC | PRN
Start: 2023-02-27 — End: 2023-05-29

## 2023-03-06 ENCOUNTER — Other Ambulatory Visit: Payer: Self-pay | Admitting: Family Medicine

## 2023-03-06 NOTE — Telephone Encounter (Signed)
Requested medication (s) are due for refill today: routing for review  Requested medication (s) are on the active medication list: yes  Last refill:  10/29/22  Future visit scheduled: yes  Notes to clinic: Medication not assigned to a protocol, review manually.       Requested Prescriptions  Pending Prescriptions Disp Refills   metroNIDAZOLE (METROGEL) 1 % gel [Pharmacy Med Name: METRONIDAZOLE TOPICAL 1% GEL] 60 g 3    Sig: APPLY TO AFFECTED AREA TOPICALLY EVERY DAY     Off-Protocol Failed - 03/06/2023  8:49 AM      Failed - Medication not assigned to a protocol, review manually.      Failed - Valid encounter within last 12 months    Recent Outpatient Visits           1 year ago Type 2 diabetes mellitus without complication, without long-term current use of insulin (HCC)   [facility] Pickard, Priscille Heidelberg, MD   1 year ago Type 2 diabetes mellitus without complication, without long-term current use of insulin (HCC)   [facility] Donita Brooks, MD   2 years ago Stage 3 chronic kidney disease, unspecified whether stage 3a or 3b CKD (HCC)   [facility] Donita Brooks, MD   2 years ago Benign paroxysmal positional vertigo of right ear   [facility] Donita Brooks, MD   3 years ago Subacromial bursitis of left shoulder joint   [facility] Pickard, Priscille Heidelberg, MD       Future Appointments             In 1 month Pickard, Priscille Heidelberg, MD [facility], [facility]            Signed Prescriptions Disp Refills   rosuvastatin (CRESTOR) 10 MG tablet 90 tablet 0    Sig: TAKE 1 TABLET BY MOUTH EVERY DAY     Cardiovascular:  Antilipid - Statins 2 Failed - 03/06/2023  8:49 AM      Failed - Cr in normal range and within 360 days    Creat  Date Value Ref Range Status  10/29/2022 1.35 (H) 0.50 - 1.05 mg/dL Final   Creatinine, Urine  Date Value Ref Range Status  10/29/2022 19  (L) 20 - 275 mg/dL Final         Failed - Valid encounter within last 12 months    Recent Outpatient Visits           1 year ago Type 2 diabetes mellitus without complication, without long-term current use of insulin (HCC)   [facility] Pickard, Priscille Heidelberg, MD   1 year ago Type 2 diabetes mellitus without complication, without long-term current use of insulin (HCC)   [facility] Donita Brooks, MD   2 years ago Stage 3 chronic kidney disease, unspecified whether stage 3a or 3b CKD (HCC)   [facility] Donita Brooks, MD   2 years ago Benign paroxysmal positional vertigo of right ear   [facility] Donita Brooks, MD   3 years ago Subacromial bursitis of left shoulder joint   [facility] Pickard, Priscille Heidelberg, MD       Future Appointments             In 1 month Pickard, Priscille Heidelberg, MD [facility], [facility]  Failed - Lipid Panel in normal range within the last 12 months    Cholesterol  Date Value Ref Range Status  10/29/2022 181 <200 mg/dL Final   LDL Cholesterol (Calc)  Date Value Ref Range Status  10/29/2022 73 mg/dL (calc) Final    Comment:    Reference range: <100 . Desirable range <100 mg/dL for primary prevention;   <70 mg/dL for patients with CHD or diabetic patients  with > or = 2 CHD risk factors. Marland Kitchen LDL-C is now calculated using the Martin-Hopkins  calculation, which is a validated novel method providing  better accuracy than the Friedewald equation in the  estimation of LDL-C.  Horald Pollen et al. Lenox Ahr. 8119;147(82): 2061-2068  (http://education.[employer].com/faq/FAQ164)    Direct LDL  Date Value Ref Range Status  03/27/2017 159 (H) <100 mg/dL Final    Comment:    ** Please note change in reference range(s). **     Desirable range <100 mg/dL for patients with CHD or diabetes and <70 mg/dL for diabetic patients with known heart  disease.      HDL  Date Value Ref Range Status  10/29/2022 94 > OR = 50 mg/dL Final   Triglycerides  Date Value Ref Range Status  10/29/2022 64 <150 mg/dL Final         Passed - Patient is not pregnant

## 2023-03-06 NOTE — Telephone Encounter (Signed)
Requested Prescriptions  Pending Prescriptions Disp Refills   rosuvastatin (CRESTOR) 10 MG tablet [Pharmacy Med Name: ROSUVASTATIN CALCIUM 10 MG TAB] 90 tablet 0    Sig: TAKE 1 TABLET BY MOUTH EVERY DAY     Cardiovascular:  Antilipid - Statins 2 Failed - 03/06/2023  8:49 AM      Failed - Cr in normal range and within 360 days    Creat  Date Value Ref Range Status  10/29/2022 1.35 (H) 0.50 - 1.05 mg/dL Final   Creatinine, Urine  Date Value Ref Range Status  10/29/2022 19 (L) 20 - 275 mg/dL Final         Failed - Valid encounter within last 12 months    Recent Outpatient Visits           1 year ago Type 2 diabetes mellitus without complication, without long-term current use of insulin (HCC)   [facility] Pickard, Priscille Heidelberg, MD   1 year ago Type 2 diabetes mellitus without complication, without long-term current use of insulin (HCC)   [facility] Donita Brooks, MD   2 years ago Stage 3 chronic kidney disease, unspecified whether stage 3a or 3b CKD (HCC)   [facility] Donita Brooks, MD   2 years ago Benign paroxysmal positional vertigo of right ear   [facility] Donita Brooks, MD   3 years ago Subacromial bursitis of left shoulder joint   [facility] Pickard, Priscille Heidelberg, MD       Future Appointments             In 1 month Pickard, Priscille Heidelberg, MD [facility]            Failed - Lipid Panel in normal range within the last 12 months    Cholesterol  Date Value Ref Range Status  10/29/2022 181 <200 mg/dL Final   LDL Cholesterol (Calc)  Date Value Ref Range Status  10/29/2022 73 mg/dL (calc) Final    Comment:    Reference range: <100 . Desirable range <100 mg/dL for primary prevention;   <70 mg/dL for patients with CHD or diabetic patients  with > or = 2 CHD risk factors. Marland Kitchen LDL-C is now calculated using the Martin-Hopkins  calculation, which is  a validated novel method providing  better accuracy than the Friedewald equation in the  estimation of LDL-C.  Horald Pollen et al. Lenox Ahr. 3875;643(32): 2061-2068  (http://education.[facility].com/faq/FAQ164)    Direct LDL  Date Value Ref Range Status  03/27/2017 159 (H) <100 mg/dL Final    Comment:    ** Please note change in reference range(s). **     Desirable range <100 mg/dL for patients with CHD or diabetes and <70 mg/dL for diabetic patients with known heart disease.      HDL  Date Value Ref Range Status  10/29/2022 94 > OR = 50 mg/dL Final   Triglycerides  Date Value Ref Range Status  10/29/2022 64 <150 mg/dL Final         Passed - Patient is not pregnant       metroNIDAZOLE (METROGEL) 1 % gel [Pharmacy Med Name: METRONIDAZOLE TOPICAL 1% GEL] 60 g 3    Sig: APPLY TO AFFECTED AREA TOPICALLY EVERY DAY     Off-Protocol Failed - 03/06/2023  8:49 AM      Failed - Medication not assigned to a protocol, review manually.  Failed - Valid encounter within last 12 months    Recent Outpatient Visits           1 year ago Type 2 diabetes mellitus without complication, without long-term current use of insulin (HCC)   [facility] Pickard, Priscille Heidelberg, MD   1 year ago Type 2 diabetes mellitus without complication, without long-term current use of insulin (HCC)   [facility] Donita Brooks, MD   2 years ago Stage 3 chronic kidney disease, unspecified whether stage 3a or 3b CKD (HCC)   [facility] Donita Brooks, MD   2 years ago Benign paroxysmal positional vertigo of right ear   [facility] Donita Brooks, MD   3 years ago Subacromial bursitis of left shoulder joint   [facility] Pickard, Priscille Heidelberg, MD       Future Appointments             In 1 month Pickard, Priscille Heidelberg, MD [facility]

## 2023-03-07 ENCOUNTER — Other Ambulatory Visit: Payer: Self-pay | Admitting: Family Medicine

## 2023-03-07 ENCOUNTER — Encounter: Payer: Self-pay | Admitting: Family Medicine

## 2023-03-07 ENCOUNTER — Other Ambulatory Visit: Payer: Self-pay

## 2023-03-07 DIAGNOSIS — G441 Vascular headache, not elsewhere classified: Secondary | ICD-10-CM

## 2023-03-07 MED ORDER — ALPRAZOLAM 0.5 MG PO TABS: 0.5000 mg | ORAL_TABLET | Freq: Every evening | ORAL | 0 refills | Status: DC | PRN

## 2023-03-07 MED ORDER — ONDANSETRON HCL 4 MG PO TABS
4.0000 mg | ORAL_TABLET | Freq: Three times a day (TID) | ORAL | 0 refills | Status: DC | PRN
Start: 2023-03-07 — End: 2023-05-29

## 2023-03-07 NOTE — Telephone Encounter (Signed)
Error, please disregard.

## 2023-03-07 NOTE — Telephone Encounter (Signed)
Requested medications are due for refill today.  yes  Requested medications are on the active medications list.   yes  Last refill. 02/14/2023 #20 0 rf  Future visit scheduled.   yes  Notes to clinic.  Refill not delegated.    Requested Prescriptions  Pending Prescriptions Disp Refills   ondansetron (ZOFRAN) 4 MG tablet [Pharmacy Med Name: ONDANSETRON HCL 4 MG TABLET] 20 tablet 0    Sig: TAKE 1 TABLET BY MOUTH EVERY 8 HOURS AS NEEDED FOR NAUSEA AND VOMITING     Not Delegated - Gastroenterology: Antiemetics - ondansetron Failed - 03/06/2023  5:00 PM      Failed - This refill cannot be delegated      Failed - ALT in normal range and within 360 days    ALT  Date Value Ref Range Status  10/29/2022 30 (H) 6 - 29 U/L Final         Failed - Valid encounter within last 6 months    Recent Outpatient Visits           1 year ago Type 2 diabetes mellitus without complication, without long-term current use of insulin (HCC)   [facility] Pickard, Priscille Heidelberg, MD   1 year ago Type 2 diabetes mellitus without complication, without long-term current use of insulin (HCC)   [facility] Donita Brooks, MD   2 years ago Stage 3 chronic kidney disease, unspecified whether stage 3a or 3b CKD (HCC)   [facility] Donita Brooks, MD   2 years ago Benign paroxysmal positional vertigo of right ear   [facility] Donita Brooks, MD   3 years ago Subacromial bursitis of left shoulder joint   [facility] Pickard, Priscille Heidelberg, MD       Future Appointments             In 1 month Pickard, Priscille Heidelberg, MD  [facility]            Passed - AST in normal range and within 360 days    AST  Date Value Ref Range Status  10/29/2022 25 10 - 35 U/L Final

## 2023-03-13 ENCOUNTER — Encounter: Payer: Self-pay | Admitting: Family Medicine

## 2023-03-13 ENCOUNTER — Other Ambulatory Visit: Payer: Self-pay

## 2023-03-13 ENCOUNTER — Ambulatory Visit: Payer: Medicare PPO | Admitting: Family Medicine

## 2023-03-13 DIAGNOSIS — E119 Type 2 diabetes mellitus without complications: Secondary | ICD-10-CM

## 2023-03-13 MED ORDER — ACCU-CHEK GUIDE VI STRP
ORAL_STRIP | 12 refills | Status: DC
Start: 2023-03-13 — End: 2023-04-01

## 2023-03-19 ENCOUNTER — Encounter: Payer: Self-pay | Admitting: Family Medicine

## 2023-03-20 ENCOUNTER — Telehealth: Payer: Self-pay

## 2023-03-20 NOTE — Telephone Encounter (Signed)
Pt called wanting to speak with nurse. Pt thinks she may have scratched her cornea and wanting to know if there is something she could do before trying to go to the e.r. Please advise.  CB#: [phone]

## 2023-03-21 ENCOUNTER — Encounter: Payer: Self-pay | Admitting: Family Medicine

## 2023-03-21 ENCOUNTER — Other Ambulatory Visit: Payer: Self-pay | Admitting: Family Medicine

## 2023-03-21 MED ORDER — ERYTHROMYCIN 5 MG/GM OP OINT: 1.0000 | TOPICAL_OINTMENT | Freq: Two times a day (BID) | OPHTHALMIC | 0 refills | Status: DC

## 2023-03-27 ENCOUNTER — Encounter: Payer: Self-pay | Admitting: Family Medicine

## 2023-03-31 ENCOUNTER — Ambulatory Visit: Payer: Medicare PPO | Admitting: Family Medicine

## 2023-04-01 ENCOUNTER — Other Ambulatory Visit: Payer: Self-pay

## 2023-04-01 DIAGNOSIS — E119 Type 2 diabetes mellitus without complications: Secondary | ICD-10-CM

## 2023-04-01 DIAGNOSIS — Z800 Family history of malignant neoplasm of digestive organs: Secondary | ICD-10-CM | POA: Insufficient documentation

## 2023-04-01 MED ORDER — ACCU-CHEK GUIDE VI STRP
ORAL_STRIP | 6 refills | Status: DC
Start: 2023-04-01 — End: 2023-12-19

## 2023-04-07 ENCOUNTER — Encounter: Payer: Self-pay | Admitting: Family Medicine

## 2023-04-07 ENCOUNTER — Telehealth: Payer: Self-pay | Admitting: Family Medicine

## 2023-04-07 DIAGNOSIS — R600 Localized edema: Secondary | ICD-10-CM | POA: Insufficient documentation

## 2023-04-07 NOTE — Telephone Encounter (Signed)
Patient called to follow up on MyChart message re; lowering dose of MOUNJARO to 2.5. Patient stated she's losing too much weight and her blood sugar is under control which should make the change feasible. Patient hasn't taken her weekly dose and wants to get back on schedule with doses as soon as possible.   New script will be needed for 1 month at local pharmacy (see below for pharmacy info). Thereafter, patient requesting for Rx to be sent to mail order ([facility]) .  Pharmacy confirmed as   [id number] [place], [place] - [address] [place] [address] Phone: [phone]  Fax: [phone] DEA #: [id number]    Requesting a call back asap. Please advise at [phone].

## 2023-04-08 ENCOUNTER — Telehealth: Payer: Self-pay

## 2023-04-08 ENCOUNTER — Other Ambulatory Visit: Payer: Self-pay | Admitting: Family Medicine

## 2023-04-08 MED ORDER — TIRZEPATIDE 2.5 MG/0.5ML ~~LOC~~ SOAJ: 2.5000 mg | SUBCUTANEOUS | 5 refills | Status: DC

## 2023-04-08 NOTE — Telephone Encounter (Addendum)
Called and spoke w/pt this morning regarding sugar report readings. Advise pt per pcp's recommendation," pt is having frequent hypoglycemia. I would stop mounjaro."  Per pt stated that do not want to stop mounjaro, instead per pt would like to decrease dose to 2.5mg  per the suggestion of the pharmacists at [facility]. Pt also stated that this is the only medication she is taking for her sugar and that it usually drops in the morning only.   Pt also stated that she took her last dose of 5mg  on 03/29/23.  Pls advice?

## 2023-04-14 ENCOUNTER — Telehealth: Payer: Self-pay

## 2023-04-14 NOTE — Telephone Encounter (Signed)
My Chart message from patient:  I have an appointment with you July 22nd to discuss this and check my A1 c and other labs   Could you PLEASE find another med ALTERNATIVE   for  Marcelline Deist for me to take??!   It killing my back ( lower back both sides )  When  I take it and it makes me  bend  over in severe pain when I walk  I can hardly straighten up my body and walk because of the [place]   I've tested it ( a lot)   Some days I DO NOT  take it and after a day or 2 I'm better from these specific pains from [place]   Pharmacist said it was [place] after a lengthy discussion. Also said it had minimum impact if any on glucose control with Marcelline Deist   I've even cut the pill in half but with the same painful results   I have a full bottle left but the pain is not worth it for me because my quality of life is severely impacted when I take Farxiga   Nothing I have or take stops the Farxiga pain   There's got to be an alternative to [place] by now   Maybe with the big body changes  I've had since I starting the MJ Im hoping I can take something else besides [place] please

## 2023-04-16 NOTE — Progress Notes (Signed)
Subjective:   Tracy Price is a 67 y.o. female who presents for Medicare Annual (Subsequent) preventive examination.  Visit Complete: Virtual  I connected with  Clent Demark on 04/17/23 by a audio enabled telemedicine application and verified that I am speaking with the correct person using two identifiers.  Patient Location: Home  Provider Location: Home Office  I discussed the limitations of evaluation and management by telemedicine. The patient expressed understanding and agreed to proceed.  Patient Medicare AWV questionnaire was completed by the patient on 04/13/23; I have confirmed that all information answered by patient is correct and no changes since this date.  Review of Systems     Cardiac Risk Factors include: advanced age (>25men, >85 women);diabetes mellitus;dyslipidemia;hypertension     Objective:    Today's Vitals   04/17/23 1136  Weight: 95 lb (43.1 kg)  Height: 4\' 11"  (1.499 m)   Body mass index is 19.19 kg/m.     04/17/2023    1:01 PM 08/02/2020    7:01 PM 04/02/2017   11:23 AM 03/10/2017    6:01 PM 03/06/2017    1:00 AM  Advanced Directives  Does Patient Have a Medical Advance Directive? No Yes Yes Yes No  Type of Advance Directive  Living will Living will Living will   Does patient want to make changes to medical advance directive?  No - Patient declined  No - Patient declined   Would patient like information on creating a medical advance directive? Yes (MAU/Ambulatory/Procedural Areas - Information given)   No - Patient declined No - Patient declined    Current Medications (verified) Outpatient Encounter Medications as of 04/17/2023  Medication Sig   alendronate (FOSAMAX) 70 MG tablet Take 1 tablet (70 mg total) by mouth every 7 (seven) days. Take with a full glass of water on an empty stomach.   ALPRAZolam (XANAX) 0.5 MG tablet Take 1 tablet (0.5 mg total) by mouth at bedtime as needed for anxiety (90 day supply).   amLODipine (NORVASC) 5 MG  tablet Take 1 tablet (5 mg total) by mouth daily.   amphetamine-dextroamphetamine (ADDERALL XR) 20 MG 24 hr capsule Take 1 capsule (20 mg total) by mouth daily.   blood glucose meter kit and supplies Dispense based on patient and insurance preference. Use up to four times daily as directed. (FOR ICD-10 E10.9, E11.9).   cholecalciferol (VITAMIN D3) 25 MCG (1000 UNIT) tablet Take 1,000 Units by mouth daily.   dapagliflozin propanediol (FARXIGA) 10 MG TABS tablet Take 1 tablet (10 mg total) by mouth daily before breakfast.   glucose blood (ACCU-CHEK GUIDE) test strip USE TO CHECK BLOOD SUGARS 5 TIMES PER DAY.   hydrochlorothiazide (MICROZIDE) 12.5 MG capsule TAKE 1 CAPSULE BY MOUTH EVERY DAY   losartan (COZAAR) 100 MG tablet Take 1 tablet (100 mg total) by mouth daily.   Magnesium 500 MG CAPS    Melatonin 5 MG TABS Take by mouth.   metroNIDAZOLE (METROGEL) 1 % gel APPLY TO AFFECTED AREA TOPICALLY EVERY DAY   ondansetron (ZOFRAN) 4 MG tablet Take 1 tablet (4 mg total) by mouth every 8 (eight) hours as needed for nausea or vomiting.   pantoprazole (PROTONIX) 40 MG tablet TAKE 1 TABLET BY MOUTH TWICE A DAY BEFORE A MEAL   rosuvastatin (CRESTOR) 10 MG tablet TAKE 1 TABLET BY MOUTH EVERY DAY   tirzepatide (MOUNJARO) 2.5 MG/0.5ML Pen Inject 2.5 mg into the skin once a week. (Patient not taking: Reported on 04/17/2023)   traMADol (  ULTRAM) 50 MG tablet Take 1 tablet (50 mg total) by mouth 3 (three) times daily as needed.   No facility-administered encounter medications on file as of 04/17/2023.    Allergies (verified) Patient has no known allergies.   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     TONSILLECTOMY     Family History  Problem Relation Age of Onset   Colon cancer Father    Heart disease Mother    Diabetes Sister 59       heart attack   Heart disease Brother    Colon polyps Brother    Breast cancer Neg Hx    Esophageal cancer Neg Hx    Rectal cancer Neg Hx    Stomach cancer Neg Hx    Social History   Socioeconomic History   Marital status: Single    Spouse name: Not on file   Number of children: Not on file   Years of education: Not on file   Highest education level: Not on file  Occupational History   Not on file  Tobacco Use   Smoking status: Never   Smokeless tobacco: Never  Vaping Use   Vaping status: Never Used  Substance and Sexual Activity   Alcohol use: No   Drug use: No   Sexual activity: Not Currently  Other Topics Concern   Not on file  Social History Narrative   Not on file   Social Determinants of Health   Financial Resource Strain: Medium Risk (04/17/2023)   Overall Financial Resource Strain (CARDIA)    Difficulty of Paying Living Expenses: Somewhat hard  Food Insecurity: Food Insecurity Present (04/17/2023)   Hunger Vital Sign    Worried About Running Out of Food in the Last Year: Sometimes true    Ran Out of Food in the Last Year: Sometimes true  Transportation Needs: No Transportation Needs (04/17/2023)   PRAPARE - Administrator, Civil Service (Medical): No    Lack of Transportation (Non-Medical): No  Physical Activity: Insufficiently Active (04/17/2023)   Exercise Vital Sign    Days of Exercise per Week: 2 days    Minutes of Exercise per Session: 20 min  Stress: No Stress Concern Present (04/17/2023)   [employer] of Occupational Health - Occupational Stress Questionnaire    Feeling of Stress  : Only a little  Social Connections: Socially Isolated (04/17/2023)   Social Connection and Isolation Panel [NHANES]    Frequency of Communication with Friends and Family: More than three times a week    Frequency of Social Gatherings with Friends and Family: Twice a week    Attends Religious Services: Never    Database administrator or Organizations: No    Attends Banker Meetings: Never    Marital Status: Never married    Tobacco Counseling Counseling given: Not Answered  Clinical Intake:  Pre-visit preparation completed: Yes  Pain : No/denies pain     Diabetes: No  How often do you need to have someone help you when you read instructions, pamphlets, or other written materials from your doctor or pharmacy?: 1 - Never  Interpreter Needed?: No  Information entered by :: Kandis Fantasia LPN   Activities of Daily Living    04/17/2023   12:58 PM 04/13/2023   10:19 AM  In your present state of health, do you have any difficulty performing the following activities:  Hearing? 0 0  Vision? 0 0  Difficulty concentrating or making decisions? 1 1  Walking or climbing stairs? 1 1  Dressing or bathing? 0 0  Doing errands, shopping? 1 1  Preparing Food and eating ? Y Y  Using the Toilet? N N  In the past six months, have you accidently leaked urine? Y Y  Do you have problems with loss of bowel control? N N  Managing your Medications? N N  Managing your Finances? N N  Housekeeping or managing your Housekeeping? Malvin Johns    Patient Care Team: Donita Brooks, MD as PCP - General (Family Medicine)  Indicate any recent Medical Services you may have received from other than [facility] providers in the past year (date may be approximate).     Assessment:   This is a routine wellness examination for Luz.  Hearing/Vision screen Hearing Screening - Comments:: Denies hearing difficulties   Vision Screening - Comments:: Wears rx glasses - up to date with routine eye exams     Dietary issues and exercise activities discussed:     Goals Addressed               This Visit's Progress     Manage blood sugar (pt-stated)         Depression Screen    04/17/2023   12:52 PM 10/29/2022   11:22 AM 04/12/2021   10:31 AM 09/14/2018    8:06 AM 11/12/2017   10:05 AM 09/08/2017   12:36 PM 07/25/2017   12:10 PM  PHQ 2/9 Scores  PHQ - 2 Score 2 2 0 0 0 2 1  PHQ- 9 Score 6 6    6 5     Fall Risk    04/17/2023   12:56 PM 04/13/2023   10:19 AM 03/31/2023   11:20 AM 03/30/2023    8:57 AM 10/29/2022   11:22 AM  Fall Risk   Falls in the past year? 1 1 1 1  0  Number falls in past yr: 1 1 1 1  0  Injury with Fall? 0 0 0 0 0  Risk for fall due to : History of fall(s);Impaired balance/gait;Impaired mobility    No Fall Risks  Follow up Falls prevention discussed;Education provided;Falls evaluation completed    Falls prevention discussed    MEDICARE RISK AT HOME:  Medicare Risk at Home - 04/17/23 1256     Any stairs in or around the home? Yes    If so, are there any without handrails? Yes    Home free of loose throw rugs in walkways, pet beds, electrical cords, etc? Yes    Adequate lighting in your home to reduce risk of falls? Yes    Life alert? No    Use of a cane, walker or w/c? No    Grab bars in the bathroom? Yes    Shower chair or bench in shower? No    Elevated toilet seat or a  handicapped toilet? Yes             TIMED UP AND GO:  Was the test performed?  No    Cognitive Function:        04/17/2023    1:00 PM  6CIT Screen  What Year? 0 points  What month? 0 points  What time? 0 points  Count back from 20 0 points  Months in reverse 0 points  Repeat phrase 0 points  Total Score 0 points    Immunizations Immunization History  Administered Date(s) Administered   Fluad Quad(high Dose 65+) 06/20/2022   Influenza,inj,Quad PF,6+ Mos 06/17/2013, 06/21/2014, 05/27/2017, 06/04/2018, 06/04/2019, 06/02/2020   Influenza-Unspecified 06/13/2016,  05/27/2017, 06/04/2018, 06/25/2021   PFIZER Comirnaty(Gray Top)Covid-19 Tri-Sucrose Vaccine 06/25/2022   PFIZER(Purple Top)SARS-COV-2 Vaccination 11/18/2019, 12/11/2019, 06/27/2020   PNEUMOCOCCAL CONJUGATE-20 03/29/2022   Palivizumab 05/17/2022   Pneumococcal Polysaccharide-23 03/14/2015   Tdap 06/19/2017   Unspecified SARS-COV-2 Vaccination 06/25/2022   Zoster Recombinant(Shingrix) 06/04/2019, 08/20/2019    TDAP status: Up to date  Pneumococcal vaccine status: Up to date  Covid-19 vaccine status: Information provided on how to obtain vaccines.   Qualifies for Shingles Vaccine? Yes   Zostavax completed No   Shingrix Completed?: Yes  Screening Tests Health Maintenance  Topic Date Due   Colonoscopy  08/18/2023   COVID-19 Vaccine (5 - 2023-24 season) 04/17/2023 (Originally 08/20/2022)   FOOT EXAM  04/19/2023   HEMOGLOBIN A1C  04/29/2023   INFLUENZA VACCINE  05/08/2023   OPHTHALMOLOGY EXAM  05/31/2023   Diabetic kidney evaluation - eGFR measurement  10/30/2023   Diabetic kidney evaluation - Urine ACR  10/30/2023   Medicare Annual Wellness (AWV)  04/16/2024   MAMMOGRAM  07/09/2024   DTaP/Tdap/Td (2 - Td or Tdap) 06/20/2027   Pneumonia Vaccine 28+ Years old  Completed   DEXA SCAN  Completed   Hepatitis C Screening  Completed   Zoster Vaccines- Shingrix  Completed   HPV VACCINES  Aged Out    Health Maintenance  Health Maintenance Due  Topic Date Due   Colonoscopy  08/18/2023    Colorectal cancer screening: Type of screening: Colonoscopy. Completed 08/17/18. Repeat every 5 years  Mammogram status: Completed 07/09/22. Repeat every year  Bone Density status: Completed 07/09/22. Results reflect: Bone density results: OSTEOPOROSIS. Repeat every 2 years.  Lung Cancer Screening: (Low Dose CT Chest recommended if Age 4-80 years, 20 pack-year currently smoking OR have quit w/in 15years.) does not qualify.   Lung Cancer Screening Referral: n/a  Additional  Screening:  Hepatitis C Screening: does qualify; Completed 03/13/18  Vision Screening: Recommended annual ophthalmology exams for early detection of glaucoma and other disorders of the eye. Is the patient up to date with their annual eye exam?  Yes  Who is the provider or what is the name of the office in which the patient attends annual eye exams? Unable to provide name  If pt is not established with a provider, would they like to be referred to a provider to establish care? No .   Dental Screening: Recommended annual dental exams for proper oral hygiene  Diabetic Foot Exam: Diabetic Foot Exam: Completed 04/18/22  Community Resource Referral / Chronic Care Management: CRR required this visit?  No   CCM required this visit?  No     Plan:     I have personally reviewed and noted the following in the patient's chart:   Medical and social history Use of alcohol, tobacco or illicit drugs  Current medications and supplements  including opioid prescriptions. Patient is currently taking opioid prescriptions. Information provided to patient regarding non-opioid alternatives. Patient advised to discuss non-opioid treatment plan with their provider. Functional ability and status Nutritional status Physical activity Advanced directives List of other physicians Hospitalizations, surgeries, and ER visits in previous 12 months Vitals Screenings to include cognitive, depression, and falls Referrals and appointments  In addition, I have reviewed and discussed with patient certain preventive protocols, quality metrics, and best practice recommendations. A written personalized care plan for preventive services as well as general preventive health recommendations were provided to patient.     Kandis Fantasia [place], [place]   1/61/0960   After Visit Summary: (MyChart) Due to this being a telephonic visit, the after visit summary with patients personalized plan was offered to patient via MyChart    Nurse Notes: Patient with concerns about Mounjaro and cgm device.  See telephone note.

## 2023-04-16 NOTE — Patient Instructions (Incomplete)
Tracy Price , Thank you for taking time to come for your Medicare Wellness Visit. I appreciate your ongoing commitment to your health goals. Please review the following plan we discussed and let me know if I can assist you in the future.   These are the goals we discussed:  Goals   None     This is a list of the screening recommended for you and due dates:  Health Maintenance  Topic Date Due   COVID-19 Vaccine (5 - 2023-24 season) 04/17/2023*   Medicare Annual Wellness Visit  08/22/2023*   Complete foot exam   04/19/2023   Hemoglobin A1C  04/29/2023   Flu Shot  05/08/2023   Eye exam for diabetics  05/31/2023   Colon Cancer Screening  08/18/2023   Yearly kidney function blood test for diabetes  10/30/2023   Yearly kidney health urinalysis for diabetes  10/30/2023   Mammogram  07/09/2024   DTaP/Tdap/Td vaccine (2 - Td or Tdap) 06/20/2027   Pneumonia Vaccine  Completed   DEXA scan (bone density measurement)  Completed   Hepatitis C Screening  Completed   Zoster (Shingles) Vaccine  Completed   HPV Vaccine  Aged Out  *Topic was postponed. The date shown is not the original due date.    Advanced directives: Information on Advanced Care Planning can be found at [facility] Advance Health Care Directives Advance Health Care Directives ([email or web address] Please bring a copy of your health care power of attorney and living will to the office to be added to your chart at your convenience.  Conditions/risks identified: Aim for 30 minutes of exercise or brisk walking, 6-8 glasses of water, and 5 servings of fruits and vegetables each day.  Next appointment: Follow up in one year for your annual wellness visit    Preventive Care 65 Years and Older, Female Preventive care refers to lifestyle choices and visits with your health care provider that can promote health and wellness. What does preventive care include? A yearly physical exam. This is also called an annual well  check. Dental exams once or twice a year. Routine eye exams. Ask your health care provider how often you should have your eyes checked. Personal lifestyle choices, including: Daily care of your teeth and gums. Regular physical activity. Eating a healthy diet. Avoiding tobacco and drug use. Limiting alcohol use. Practicing safe sex. Taking low-dose aspirin every day. Taking vitamin and mineral supplements as recommended by your health care provider. What happens during an annual well check? The services and screenings done by your health care provider during your annual well check will depend on your age, overall health, lifestyle risk factors, and family history of disease. Counseling  Your health care provider may ask you questions about your: Alcohol use. Tobacco use. Drug use. Emotional well-being. Home and relationship well-being. Sexual activity. Eating habits. History of falls. Memory and ability to understand (cognition). Work and work Astronomer. Reproductive health. Screening  You may have the following tests or measurements: Height, weight, and BMI. Blood pressure. Lipid and cholesterol levels. These may be checked every 5 years, or more frequently if you are over 61 years old. Skin check. Lung cancer screening. You may have this screening every year starting at age 27 if you have a 30-pack-year history of smoking and currently smoke or have quit within the past 15 years. Fecal occult blood test (FOBT) of the stool. You may have this test every year starting at age 85. Flexible sigmoidoscopy or colonoscopy.  You may have a sigmoidoscopy every 5 years or a colonoscopy every 10 years starting at age 93. Hepatitis C blood test. Hepatitis B blood test. Sexually transmitted disease (STD) testing. Diabetes screening. This is done by checking your blood sugar (glucose) after you have not eaten for a while (fasting). You may have this done every 1-3 years. Bone density scan.  This is done to screen for osteoporosis. You may have this done starting at age 49. Mammogram. This may be done every 1-2 years. Talk to your health care provider about how often you should have regular mammograms. Talk with your health care provider about your test results, treatment options, and if necessary, the need for more tests. Vaccines  Your health care provider may recommend certain vaccines, such as: Influenza vaccine. This is recommended every year. Tetanus, diphtheria, and acellular pertussis (Tdap, Td) vaccine. You may need a Td booster every 10 years. Zoster vaccine. You may need this after age 15. Pneumococcal 13-valent conjugate (PCV13) vaccine. One dose is recommended after age 23. Pneumococcal polysaccharide (PPSV23) vaccine. One dose is recommended after age 2. Talk to your health care provider about which screenings and vaccines you need and how often you need them. This information is not intended to replace advice given to you by your health care provider. Make sure you discuss any questions you have with your health care provider. Document Released: 10/20/2015 Document Revised: 06/12/2016 Document Reviewed: 07/25/2015 Elsevier Interactive Patient Education  2017 [employer].  Fall Prevention in the Home Falls can cause injuries. They can happen to people of all ages. There are many things you can do to make your home safe and to help prevent falls. What can I do on the outside of my home? Regularly fix the edges of walkways and driveways and fix any cracks. Remove anything that might make you trip as you walk through a door, such as a raised step or threshold. Trim any bushes or trees on the path to your home. Use bright outdoor lighting. Clear any walking paths of anything that might make someone trip, such as rocks or tools. Regularly check to see if handrails are loose or broken. Make sure that both sides of any steps have handrails. Any raised decks and porches  should have guardrails on the edges. Have any leaves, snow, or ice cleared regularly. Use sand or salt on walking paths during winter. Clean up any spills in your garage right away. This includes oil or grease spills. What can I do in the bathroom? Use night lights. Install grab bars by the toilet and in the tub and shower. Do not use towel bars as grab bars. Use non-skid mats or decals in the tub or shower. If you need to sit down in the shower, use a plastic, non-slip stool. Keep the floor dry. Clean up any water that spills on the floor as soon as it happens. Remove soap buildup in the tub or shower regularly. Attach bath mats securely with double-sided non-slip rug tape. Do not have throw rugs and other things on the floor that can make you trip. What can I do in the bedroom? Use night lights. Make sure that you have a light by your bed that is easy to reach. Do not use any sheets or blankets that are too big for your bed. They should not hang down onto the floor. Have a firm chair that has side arms. You can use this for support while you get dressed. Do not have  throw rugs and other things on the floor that can make you trip. What can I do in the kitchen? Clean up any spills right away. Avoid walking on wet floors. Keep items that you use a lot in easy-to-reach places. If you need to reach something above you, use a strong step stool that has a grab bar. Keep electrical cords out of the way. Do not use floor polish or wax that makes floors slippery. If you must use wax, use non-skid floor wax. Do not have throw rugs and other things on the floor that can make you trip. What can I do with my stairs? Do not leave any items on the stairs. Make sure that there are handrails on both sides of the stairs and use them. Fix handrails that are broken or loose. Make sure that handrails are as long as the stairways. Check any carpeting to make sure that it is firmly attached to the stairs.  Fix any carpet that is loose or worn. Avoid having throw rugs at the top or bottom of the stairs. If you do have throw rugs, attach them to the floor with carpet tape. Make sure that you have a light switch at the top of the stairs and the bottom of the stairs. If you do not have them, ask someone to add them for you. What else can I do to help prevent falls? Wear shoes that: Do not have high heels. Have rubber bottoms. Are comfortable and fit you well. Are closed at the toe. Do not wear sandals. If you use a stepladder: Make sure that it is fully opened. Do not climb a closed stepladder. Make sure that both sides of the stepladder are locked into place. Ask someone to hold it for you, if possible. Clearly mark and make sure that you can see: Any grab bars or handrails. First and last steps. Where the edge of each step is. Use tools that help you move around (mobility aids) if they are needed. These include: Canes. Walkers. Scooters. Crutches. Turn on the lights when you go into a dark area. Replace any light bulbs as soon as they burn out. Set up your furniture so you have a clear path. Avoid moving your furniture around. If any of your floors are uneven, fix them. If there are any pets around you, be aware of where they are. Review your medicines with your doctor. Some medicines can make you feel dizzy. This can increase your chance of falling. Ask your doctor what other things that you can do to help prevent falls. This information is not intended to replace advice given to you by your health care provider. Make sure you discuss any questions you have with your health care provider. Document Released: 07/20/2009 Document Revised: 02/29/2016 Document Reviewed: 10/28/2014 Elsevier Interactive Patient Education  2017 [employer].

## 2023-04-17 ENCOUNTER — Telehealth: Payer: Self-pay

## 2023-04-17 ENCOUNTER — Ambulatory Visit (INDEPENDENT_AMBULATORY_CARE_PROVIDER_SITE_OTHER): Payer: Medicare PPO

## 2023-04-17 VITALS — Ht 59.0 in | Wt 95.0 lb

## 2023-04-17 DIAGNOSIS — Z0000 Encounter for general adult medical examination without abnormal findings: Secondary | ICD-10-CM

## 2023-04-17 DIAGNOSIS — Z1231 Encounter for screening mammogram for malignant neoplasm of breast: Secondary | ICD-10-CM

## 2023-04-17 NOTE — Telephone Encounter (Signed)
FYI:  Patient seen for AWV and states that she is no longer able to take [facility] because of excessive weight loss, swelling in legs, and low blood sugars.  She is stopping medication until she come in for office visit on 05/01/23. She is also asking about status of CGM order.

## 2023-04-18 ENCOUNTER — Telehealth: Payer: Self-pay

## 2023-04-18 NOTE — Telephone Encounter (Signed)
My chart message from pt with c/o swelling to both legs for the last 3 days. Pt states she did take one of her brother's Lasix because she was worried and it did help. I have made her an appointment for [place], 7/16. Does she need to do anything else in the meantime?   Pt also states she stopped her Mounjaro and will need another medication for her diabetes. Thank you.

## 2023-04-20 ENCOUNTER — Encounter: Payer: Self-pay | Admitting: Family Medicine

## 2023-04-22 ENCOUNTER — Encounter: Payer: Self-pay | Admitting: Family Medicine

## 2023-04-22 ENCOUNTER — Ambulatory Visit: Payer: Medicare PPO | Admitting: Family Medicine

## 2023-04-22 VITALS — BP 110/62 | HR 92 | Temp 97.3°F | Ht 59.0 in | Wt 93.2 lb

## 2023-04-22 DIAGNOSIS — D619 Aplastic anemia, unspecified: Secondary | ICD-10-CM | POA: Diagnosis not present

## 2023-04-22 DIAGNOSIS — R634 Abnormal weight loss: Secondary | ICD-10-CM | POA: Diagnosis not present

## 2023-04-22 DIAGNOSIS — R7989 Other specified abnormal findings of blood chemistry: Secondary | ICD-10-CM | POA: Diagnosis not present

## 2023-04-22 NOTE — Progress Notes (Signed)
Subjective:    Patient ID: Tracy Price, female    DOB: 1955-10-12, 67 y.o.   MRN: [id number]  I last saw the patient in February.  At that time we started Adderall for ADD.  Around the same time, the patient has been on Mounjaro to manage her diabetes.  She was previously on Farxiga as well.  Since I last saw the patient she is rapidly lost weight Wt Readings from Last 3 Encounters:  04/22/23 93 lb 3.2 oz (42.3 kg)  04/17/23 95 lb (43.1 kg)  12/05/22 122 lb 6.4 oz (55.5 kg)   Patient's weight is down 30 pounds.  She attributes it to the [facility].  She quit Marcelline Deist a few. Neuro patient reports weakness in both legs.  She reports numbness and tingling in both legs.  She reports pain in her shoulder joints and in her back.  She denies any fevers or chills but she does report swelling in her legs.  She took 3 days of Lasix that she borrowed from a friend.  Today there is no swelling in her legs.  Patient appears frail and cachectic.  She denies any blood in her stools or melena.  She denies any chest pain or shortness of breath.  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 (Patient not taking: Reported on 04/17/2023) 2 mL 5   alendronate (FOSAMAX) 70 MG tablet Take 1  tablet (70 mg total) by mouth every 7 (seven) days. Take with a full glass of water on an empty stomach. 4 tablet 11   ALPRAZolam (XANAX) 0.5 MG tablet Take 1 tablet (0.5 mg total) by mouth at bedtime as needed for anxiety (90 day supply). 90 tablet 0   amLODipine (NORVASC) 5 MG tablet Take 1 tablet (5 mg total) by mouth daily. 90 tablet 3   amphetamine-dextroamphetamine (ADDERALL XR) 20 MG 24 hr capsule Take 1 capsule (20 mg total) by mouth daily. 90 capsule 0   blood glucose meter kit and supplies Dispense based on patient and insurance preference. Use up to four times daily as directed. (FOR ICD-10 E10.9, E11.9). 1 each 0   cholecalciferol (VITAMIN D3) 25 MCG (1000 UNIT) tablet Take 1,000 Units by mouth daily.     dapagliflozin propanediol (FARXIGA) 10 MG TABS tablet Take 1 tablet (10 mg total) by mouth daily before breakfast. 90 tablet 3   glucose blood (ACCU-CHEK GUIDE) test strip USE TO CHECK BLOOD SUGARS 5 TIMES PER DAY. 200 each 6   hydrochlorothiazide (MICROZIDE) 12.5 MG capsule TAKE 1 CAPSULE BY MOUTH EVERY DAY 90 capsule 3   losartan (COZAAR) 100 MG tablet Take  1 tablet (100 mg total) by mouth daily. 90 tablet 3   Magnesium 500 MG CAPS      Melatonin 5 MG TABS Take by mouth.     metroNIDAZOLE (METROGEL) 1 % gel APPLY TO AFFECTED AREA TOPICALLY EVERY DAY 60 g 3   ondansetron (ZOFRAN) 4 MG tablet Take 1 tablet (4 mg total) by mouth every 8 (eight) hours as needed for nausea or vomiting. 20 tablet 0   pantoprazole (PROTONIX) 40 MG tablet TAKE 1 TABLET BY MOUTH TWICE A DAY BEFORE A MEAL 180 tablet 3   rosuvastatin (CRESTOR) 10 MG tablet TAKE 1 TABLET BY MOUTH EVERY DAY 90 tablet 0   traMADol (ULTRAM) 50 MG tablet Take 1 tablet (50 mg total) by mouth 3 (three) times daily as needed. 180 tablet 0   No current facility-administered medications on file prior to visit.   No Known Allergies  Social History   Socioeconomic History   Marital status: Single    Spouse name: Not on file    Number of children: Not on file   Years of education: Not on file   Highest education level: Bachelor's degree (e.g., BA, AB, BS)  Occupational History   Not on file  Tobacco Use   Smoking status: Never   Smokeless tobacco: Never  Vaping Use   Vaping status: Never Used  Substance and Sexual Activity   Alcohol use: No   Drug use: No   Sexual activity: Not Currently  Other Topics Concern   Not on file  Social History Narrative   Not on file   Social Determinants of Health   Financial Resource Strain: Medium Risk (04/19/2023)   Overall Financial Resource Strain (CARDIA)    Difficulty of Paying Living Expenses: Somewhat hard  Food Insecurity: Food Insecurity Present (04/19/2023)   Hunger Vital Sign    Worried About Running Out of Food in the Last Year: Sometimes true    Ran Out of Food in the Last Year: Sometimes true  Transportation Needs: No Transportation Needs (04/19/2023)   PRAPARE - Administrator, Civil Service (Medical): No    Lack of Transportation (Non-Medical): No  Physical Activity: Inactive (04/19/2023)   Exercise Vital Sign    Days of Exercise per Week: 0 days    Minutes of Exercise per Session: 20 min  Stress: No Stress Concern Present (04/19/2023)   [employer] of Occupational Health - Occupational Stress Questionnaire    Feeling of Stress : Only a little  Social Connections: Moderately Integrated (04/19/2023)   Social Connection and Isolation Panel [NHANES]    Frequency of Communication with Friends and Family: Twice a week    Frequency of Social Gatherings with Friends and Family: Twice a week    Attends Religious Services: 1 to 4 times per year    Active Member of [employer] or Organizations: Yes    Attends Banker Meetings: 1 to 4 times per year    Marital Status: Never married  Recent Concern: Social Connections - Socially Isolated (04/17/2023)   Social Connection and Isolation Panel [NHANES]    Frequency of Communication with  Friends and Family: More than three times a week    Frequency of Social Gatherings with Friends and Family: Twice a week    Attends Religious Services: Never    Database administrator or Organizations: No    Attends Banker Meetings: Never    Marital Status: Never married  Catering manager Violence: Not At Risk (  04/17/2023)   Humiliation, Afraid, Rape, and Kick questionnaire    Fear of Current or Ex-Partner: No    Emotionally Abused: No    Physically Abused: No    Sexually Abused: No      Review of Systems  Skin:  Positive for rash.       Objective:   Physical Exam Vitals reviewed.  Constitutional:      Appearance: Normal appearance. She is cachectic. She is not ill-appearing, toxic-appearing or diaphoretic.  Cardiovascular:     Rate and Rhythm: Normal rate and regular rhythm.     Heart sounds: Normal heart sounds. No murmur heard.    No gallop.  Pulmonary:     Effort: Pulmonary effort is normal. No respiratory distress.     Breath sounds: Normal breath sounds. No stridor. No wheezing, rhonchi or rales.  Musculoskeletal:     Right lower leg: No edema.     Left lower leg: No edema.  Neurological:     General: No focal deficit present.     Mental Status: She is alert. Mental status is at baseline.  Psychiatric:        Mood and Affect: Mood normal.        Behavior: Behavior normal.        Thought Content: Thought content normal.           Assessment & Plan:  Unintentional weight loss - Plan: CBC with Differential/Platelet, COMPLETE METABOLIC PANEL WITH GFR, TSH, Vitamin B12, Hemoglobin A1c, Fecal occult blood, imunochemical, Protein electrophoresis, serum, Sedimentation rate I am extremely concerned about the weight loss.  Discontinue Mounjaro immediately.  Discontinue Farxiga.  Discontinue Adderall.  Encouraged the patient to start a protein supplement.  Recommended boost.  Recommended eating a high-protein diet.  Check CBC, CMP, TSH, vitamin B12, A1c,  and fecal occult blood.  Given the bone pain I will check a serum protein electrophoresis and also a sedimentation rate.  If lab work is normal, would recommend a chest x-ray as well as a CT scan of the abdomen and pelvis given unintentional weight loss.  Hopefully this is just related to medication and will improve rapidly once she stops them.  Believe that the edema may be due to third spacing from malnourishment.  Today there is no edema on exam

## 2023-04-22 NOTE — Addendum Note (Signed)
Addended by: Venia Carbon K on: 04/22/2023 04:44 PM   Modules accepted: Orders

## 2023-04-23 ENCOUNTER — Telehealth: Payer: Self-pay | Admitting: Family Medicine

## 2023-04-23 ENCOUNTER — Encounter: Payer: Self-pay | Admitting: Family Medicine

## 2023-04-23 NOTE — Telephone Encounter (Signed)
Received call from [facility] [place] with [employer] to follow up on patient's application for a Dexcom G7 monitor. Judeth Cornfield stated the paperwork submitted is incomplete: question #8 was unanswered. She will refax (verbals not accepted).  Judeth Cornfield stated if the answer to question #8 is no, additional chart notes will be required.  When received, paperwork will be placed on provider's desk.

## 2023-04-24 ENCOUNTER — Other Ambulatory Visit: Payer: Self-pay

## 2023-04-24 LAB — CBC WITH DIFFERENTIAL/PLATELET
Absolute Monocytes: 541 cells/uL (ref 200–950)
Eosinophils Absolute: 59 cells/uL (ref 15–500)
HCT: 31.4 % — ABNORMAL LOW (ref 35.0–45.0)
Lymphs Abs: 2072 cells/uL (ref 850–3900)
MCH: 34.5 pg — ABNORMAL HIGH (ref 27.0–33.0)
Neutro Abs: 3835 cells/uL (ref 1500–7800)
Neutrophils Relative %: 58.1 %
Platelets: 455 10*3/uL — ABNORMAL HIGH (ref 140–400)
WBC: 6.6 10*3/uL (ref 3.8–10.8)

## 2023-04-24 LAB — COMPLETE METABOLIC PANEL WITH GFR
AST: 19 U/L (ref 10–35)
BUN/Creatinine Ratio: 33 (calc) — ABNORMAL HIGH (ref 6–22)
BUN: 42 mg/dL — ABNORMAL HIGH (ref 7–25)
CO2: 27 mmol/L (ref 20–32)
Calcium: 8.7 mg/dL (ref 8.6–10.4)
Chloride: 103 mmol/L (ref 98–110)
Creat: 1.26 mg/dL — ABNORMAL HIGH (ref 0.50–1.05)
Glucose, Bld: 118 mg/dL — ABNORMAL HIGH (ref 65–99)
Sodium: 140 mmol/L (ref 135–146)
Total Bilirubin: 0.6 mg/dL (ref 0.2–1.2)
Total Protein: 5.8 g/dL — ABNORMAL LOW (ref 6.1–8.1)
eGFR: 47 mL/min/{1.73_m2} — ABNORMAL LOW (ref >=60)

## 2023-04-24 LAB — PROTEIN ELECTROPHORESIS, SERUM
Albumin ELP: 3 g/dL — ABNORMAL LOW (ref 3.8–4.8)
Alpha 2: 0.8 g/dL (ref 0.5–0.9)
Total Protein: 5.8 g/dL — ABNORMAL LOW (ref 6.1–8.1)

## 2023-04-25 NOTE — Telephone Encounter (Signed)
Patient called again to follow up on previous messages; stated she had to call the MyChart help desk to access her account and isn't able to see messages after January 2024.   Requesting for you to resend list you sent her yesterday and today regarding Dr. Caren Macadam instructions.  Please advise at [phone] if you need to speak with her.

## 2023-04-26 LAB — CBC WITH DIFFERENTIAL/PLATELET
Basophils Absolute: 92 cells/uL (ref 0–200)
Basophils Relative: 1.4 %
Eosinophils Relative: 0.9 %
Hemoglobin: 10.6 g/dL — ABNORMAL LOW (ref 11.7–15.5)
MCHC: 33.8 g/dL (ref 32.0–36.0)
MCV: 102.3 fL — ABNORMAL HIGH (ref 80.0–100.0)
MPV: 9.7 fL (ref 7.5–12.5)
Monocytes Relative: 8.2 %
RBC: 3.07 10*6/uL — ABNORMAL LOW (ref 3.80–5.10)
RDW: 12.6 % (ref 11.0–15.0)
Total Lymphocyte: 31.4 %

## 2023-04-26 LAB — TEST AUTHORIZATION

## 2023-04-26 LAB — HEMOGLOBIN A1C
Hgb A1c MFr Bld: 4.8 % of total Hgb (ref <5.7)
Mean Plasma Glucose: 91 mg/dL
eAG (mmol/L): 5 mmol/L

## 2023-04-26 LAB — IRON,TIBC AND FERRITIN PANEL
%SAT: 49 % (calc) — ABNORMAL HIGH (ref 16–45)
Ferritin: 169 ng/mL (ref 16–288)
Iron: 72 ug/dL (ref 45–160)
TIBC: 146 mcg/dL (calc) — ABNORMAL LOW (ref 250–450)

## 2023-04-26 LAB — COMPLETE METABOLIC PANEL WITH GFR
AG Ratio: 1.2 (calc) (ref 1.0–2.5)
ALT: 19 U/L (ref 6–29)
Albumin: 3.2 g/dL — ABNORMAL LOW (ref 3.6–5.1)
Alkaline phosphatase (APISO): 71 U/L (ref 37–153)
Globulin: 2.6 g/dL (calc) (ref 1.9–3.7)
Potassium: 4.1 mmol/L (ref 3.5–5.3)

## 2023-04-26 LAB — VITAMIN B12: Vitamin B-12: >2000 pg/mL — ABNORMAL HIGH (ref 200–1100)

## 2023-04-26 LAB — PROTEIN ELECTROPHORESIS, SERUM
Alpha 1: 0.5 g/dL — ABNORMAL HIGH (ref 0.2–0.3)
Beta 2: 0.3 g/dL (ref 0.2–0.5)
Beta Globulin: 0.2 g/dL — ABNORMAL LOW (ref 0.4–0.6)
Gamma Globulin: 0.9 g/dL (ref 0.8–1.7)

## 2023-04-26 LAB — TSH: TSH: 21.3 mIU/L — ABNORMAL HIGH (ref 0.40–4.50)

## 2023-04-26 LAB — T4, FREE: Free T4: 1.2 ng/dL (ref 0.8–1.8)

## 2023-04-26 LAB — T3, FREE: T3, Free: 2.4 pg/mL (ref 2.3–4.2)

## 2023-04-26 LAB — SEDIMENTATION RATE: Sed Rate: 6 mm/h (ref 0–30)

## 2023-04-27 ENCOUNTER — Other Ambulatory Visit: Payer: Self-pay | Admitting: Family Medicine

## 2023-04-27 DIAGNOSIS — Z0189 Encounter for other specified special examinations: Secondary | ICD-10-CM | POA: Diagnosis not present

## 2023-04-29 ENCOUNTER — Inpatient Hospital Stay: Admission: RE | Admit: 2023-04-29 | Payer: Medicare PPO | Source: Ambulatory Visit

## 2023-04-30 LAB — FECAL GLOBIN BY IMMUNOCHEMISTRY
FECAL GLOBIN RESULT:: NOT DETECTED
MICRO NUMBER:: 15232532
SPECIMEN QUALITY:: ADEQUATE

## 2023-04-30 LAB — HOUSE ACCOUNT TRACKING

## 2023-05-01 ENCOUNTER — Encounter: Payer: Self-pay | Admitting: Family Medicine

## 2023-05-01 ENCOUNTER — Ambulatory Visit: Payer: Medicare PPO | Admitting: Family Medicine

## 2023-05-01 ENCOUNTER — Ambulatory Visit (INDEPENDENT_AMBULATORY_CARE_PROVIDER_SITE_OTHER): Payer: Medicare PPO | Admitting: Family Medicine

## 2023-05-01 VITALS — BP 110/60 | HR 74 | Temp 97.4°F | Ht 59.0 in | Wt 95.8 lb

## 2023-05-01 DIAGNOSIS — R634 Abnormal weight loss: Secondary | ICD-10-CM

## 2023-05-01 NOTE — Progress Notes (Signed)
Wt Readings from Last 3 Encounters:  05/01/23 95 lb 12.8 oz (43.5 kg)  04/22/23 93 lb 3.2 oz (42.3 kg)  04/17/23 95 lb (43.1 kg)     Subjective:    Patient ID: Tracy Price, female    DOB: 1956/02/22, 67 y.o.   MRN: [id number]  I last saw the patient in February.  At that time we started Adderall for ADD.  Around the same time, the patient has been on Mounjaro to manage her diabetes.  She was previously on Farxiga as well.  Since I last saw the patient she is rapidly lost weight Wt Readings from Last 3 Encounters:  05/01/23 95 lb 12.8 oz (43.5 kg)  04/22/23 93 lb 3.2 oz (42.3 kg)  04/17/23 95 lb (43.1 kg)   Patient has gained 2 pounds since I last saw her.  We quit Legrand Pitts, and Adderall.  I encouraged her to liberalize her diet.  Labs showed her hemoglobin was down to 10.6.  Iron levels and B12 levels were normal.  Stool was negative for blood.  Baseline hemoglobin 11-12.  The second of the but T3 and T4 were normal.  Patient continues to have some numbness and tingling and paresthesias in her legs distal to her knee. Past Medical History:  Diagnosis Date   Allergy    Chest pain    CKD (chronic kidney disease) stage 3, GFR 30-59 ml/min (HCC)    Diabetes mellitus without complication (HCC)    type 2   Hyperlipidemia    Hypertension    Sleep apnea 1995   had surgery to correct   Subdural hematoma [facility])     Past Surgical History:  Procedure Laterality Date   CRANIOTOMY Left 03/05/2017   Procedure: LEFT BURR HOLE  HEMATOMA EVACUATION SUBDURAL;  Surgeon: Donalee Citrin, MD;  Location: [facility] OR;  Service: Neurosurgery;  Laterality: Left;   GASTRIC BYPASS  2005   orif arm     TONSILLECTOMY     Current Outpatient Medications on File Prior to Visit  Medication Sig Dispense Refill   alendronate (FOSAMAX) 70 MG tablet Take 1 tablet (70 mg total) by mouth every 7 (seven) days. Take with a full glass of water on an empty stomach. 4 tablet 11   ALPRAZolam (XANAX) 0.5 MG tablet Take 1  tablet (0.5 mg total) by mouth at bedtime as needed for anxiety (90 day supply). 90 tablet 0   blood glucose meter kit and supplies Dispense based on patient and insurance preference. Use up to four times daily as directed. (FOR ICD-10 E10.9, E11.9). 1 each 0   cholecalciferol (VITAMIN D3) 25 MCG (1000 UNIT) tablet Take 1,000 Units by mouth daily.     ferrous sulfate 325 (65 FE) MG tablet      glucose blood (ACCU-CHEK GUIDE) test strip USE TO CHECK BLOOD SUGARS 5 TIMES PER DAY. 200 each 6   losartan (COZAAR) 100 MG tablet Take 1 tablet (100 mg total) by mouth daily. 90 tablet 3   Magnesium 500 MG CAPS      Melatonin 5 MG TABS Take by mouth.     metroNIDAZOLE (METROGEL) 1 % gel APPLY TO AFFECTED AREA TOPICALLY EVERY DAY 60 g 3   ondansetron (ZOFRAN) 4 MG tablet Take 1 tablet (4 mg total) by mouth every 8 (eight) hours as needed for nausea or vomiting. 20 tablet 0   pantoprazole (PROTONIX) 40 MG tablet TAKE 1 TABLET BY MOUTH TWICE A DAY BEFORE A MEAL 180 tablet 3  rosuvastatin (CRESTOR) 10 MG tablet TAKE 1 TABLET BY MOUTH EVERY DAY 90 tablet 0   traMADol (ULTRAM) 50 MG tablet Take 1 tablet (50 mg total) by mouth 3 (three) times daily as needed. 180 tablet 0   No current facility-administered medications on file prior to visit.   No Known Allergies  Social History   Socioeconomic History   Marital status: Single    Spouse name: Not on file   Number of children: Not on file   Years of education: Not on file   Highest education level: Bachelor's degree (e.g., BA, AB, BS)  Occupational History   Not on file  Tobacco Use   Smoking status: Never   Smokeless tobacco: Never  Vaping Use   Vaping status: Never Used  Substance and Sexual Activity   Alcohol use: No   Drug use: No   Sexual activity: Not Currently  Other Topics Concern   Not on file  Social History Narrative   Not on file   Social Determinants of Health   Financial Resource Strain: Medium Risk (04/19/2023)   Overall  Financial Resource Strain (CARDIA)    Difficulty of Paying Living Expenses: Somewhat hard  Food Insecurity: Food Insecurity Present (04/19/2023)   Hunger Vital Sign    Worried About Running Out of Food in the Last Year: Sometimes true    Ran Out of Food in the Last Year: Sometimes true  Transportation Needs: No Transportation Needs (04/19/2023)   PRAPARE - Administrator, Civil Service (Medical): No    Lack of Transportation (Non-Medical): No  Physical Activity: Inactive (04/19/2023)   Exercise Vital Sign    Days of Exercise per Week: 0 days    Minutes of Exercise per Session: 20 min  Stress: No Stress Concern Present (04/19/2023)   [employer] of Occupational Health - Occupational Stress Questionnaire    Feeling of Stress : Only a little  Social Connections: Moderately Integrated (04/19/2023)   Social Connection and Isolation Panel [NHANES]    Frequency of Communication with Friends and Family: Twice a week    Frequency of Social Gatherings with Friends and Family: Twice a week    Attends Religious Services: 1 to 4 times per year    Active Member of [employer] or Organizations: Yes    Attends Banker Meetings: 1 to 4 times per year    Marital Status: Never married  Recent Concern: Social Connections - Socially Isolated (04/17/2023)   Social Connection and Isolation Panel [NHANES]    Frequency of Communication with Friends and Family: More than three times a week    Frequency of Social Gatherings with Friends and Family: Twice a week    Attends Religious Services: Never    Database administrator or Organizations: No    Attends Banker Meetings: Never    Marital Status: Never married  Intimate Partner Violence: Not At Risk (04/17/2023)   Humiliation, Afraid, Rape, and Kick questionnaire    Fear of Current or Ex-Partner: No    Emotionally Abused: No    Physically Abused: No    Sexually Abused: No      Review of Systems  Skin:  Positive for  rash.       Objective:   Physical Exam Vitals reviewed.  Constitutional:      Appearance: Normal appearance. She is cachectic. She is not ill-appearing, toxic-appearing or diaphoretic.  Cardiovascular:     Rate and Rhythm: Normal rate and regular rhythm.  Heart sounds: Normal heart sounds. No murmur heard.    No gallop.  Pulmonary:     Effort: Pulmonary effort is normal. No respiratory distress.     Breath sounds: Normal breath sounds. No stridor. No wheezing, rhonchi or rales.  Musculoskeletal:     Right lower leg: No edema.     Left lower leg: No edema.  Neurological:     General: No focal deficit present.     Mental Status: She is alert. Mental status is at baseline.  Psychiatric:        Mood and Affect: Mood normal.        Behavior: Behavior normal.        Thought Content: Thought content normal.           Assessment & Plan:  Unintentional weight loss I believe the anemia and the elevated TSH could be due to to the shock to her system to rapid 30 pound weight loss.  I explained to the patient that I feel she is malnourished and that this is likely causing the anemia the subclinical hypothyroidism, and perhaps even the peripheral neuropathy.  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  Therefore I begged the patient to liberalize her diet for the next 3 weeks.  I would like her to gain 10 pounds.  I believe that as she gains weight, her TSH and hemoglobin will improve.  I plan to repeat all of his lab work at her follow-up visit in 3 weeks.  Discontinue amlodipine and hydrochlorothiazide due to low blood pressure

## 2023-05-06 ENCOUNTER — Telehealth: Payer: Self-pay

## 2023-05-06 ENCOUNTER — Other Ambulatory Visit: Payer: Self-pay | Admitting: Family Medicine

## 2023-05-06 NOTE — Telephone Encounter (Signed)
Patient also left a voicemail message stating [facility] informed her someone told them she is not hypoglycemic. Patient stated in voicemail she has low blood sugar levels at night. Requesting call back from nurse. Please see previous messages in thread for complete details.

## 2023-05-06 NOTE — Telephone Encounter (Signed)
Pt called in wanting to speak with nurse about getting her continuous glucose monitor. Pt would like to know the status of getting this please. Please advise  Cb#: [phone]

## 2023-05-06 NOTE — Telephone Encounter (Signed)
Prescription Request  05/06/2023  LOV: 05/01/2023  What is the name of the medication or equipment?   pantoprazole (PROTONIX) 40 MG tablet   Have you contacted your pharmacy to request a refill? Yes   Which pharmacy would you like this sent to?  [id number] [place], [place] - [address] [place] [address] Phone: [phone] Fax: [phone]    Patient notified that their request is being sent to the clinical staff for review and that they should receive a response within 2 business days.   Please advise pharmacist.

## 2023-05-07 MED ORDER — PANTOPRAZOLE SODIUM 40 MG PO TBEC: DELAYED_RELEASE_TABLET | ORAL | 3 refills | Status: DC

## 2023-05-07 NOTE — Telephone Encounter (Signed)
Last OV 04/30/23 Requested Prescriptions  Pending Prescriptions Disp Refills   pantoprazole (PROTONIX) 40 MG tablet 180 tablet 3    Sig: TAKE 1 TABLET BY MOUTH TWICE A DAY BEFORE A MEAL     Gastroenterology: Proton Pump Inhibitors Failed - 05/06/2023 10:19 AM      Failed - Valid encounter within last 12 months    Recent Outpatient Visits           1 year ago Type 2 diabetes mellitus without complication, without long-term current use of insulin (HCC)   [facility] Pickard, Priscille Heidelberg, MD   2 years ago Type 2 diabetes mellitus without complication, without long-term current use of insulin (HCC)   [facility] Donita Brooks, MD   2 years ago Stage 3 chronic kidney disease, unspecified whether stage 3a or 3b CKD (HCC)   [facility] Donita Brooks, MD   2 years ago Benign paroxysmal positional vertigo of right ear   [facility] Donita Brooks, MD   3 years ago Subacromial bursitis of left shoulder joint   [facility] Pickard, Priscille Heidelberg, MD       Future Appointments             In 3 weeks Pickard, Priscille Heidelberg, MD [facility]

## 2023-05-08 ENCOUNTER — Inpatient Hospital Stay: Admission: RE | Admit: 2023-05-08 | Payer: Medicare PPO | Source: Ambulatory Visit

## 2023-05-08 DIAGNOSIS — E109 Type 1 diabetes mellitus without complications: Secondary | ICD-10-CM | POA: Diagnosis not present

## 2023-05-20 ENCOUNTER — Encounter: Payer: Self-pay | Admitting: Family Medicine

## 2023-05-21 ENCOUNTER — Ambulatory Visit: Payer: Medicare PPO

## 2023-05-22 ENCOUNTER — Other Ambulatory Visit: Payer: Self-pay | Admitting: Family Medicine

## 2023-05-22 DIAGNOSIS — G441 Vascular headache, not elsewhere classified: Secondary | ICD-10-CM

## 2023-05-22 NOTE — Telephone Encounter (Signed)
Requested medication (s) are due for refill today - yes  Requested medication (s) are on the active medication list -yes  Future visit scheduled -yes  Last refill: 03/07/23 #20  Notes to clinic: non delegated Rx  Requested Prescriptions  Pending Prescriptions Disp Refills   ondansetron (ZOFRAN) 4 MG tablet [Pharmacy Med Name: ONDANSETRON HCL 4 MG TABLET] 20 tablet 0    Sig: TAKE 1 TABLET BY MOUTH EVERY 8 HOURS AS NEEDED FOR NAUSEA AND VOMITING     Not Delegated - Gastroenterology: Antiemetics - ondansetron Failed - 05/22/2023  6:20 AM      Failed - This refill cannot be delegated      Failed - Valid encounter within last 6 months    Recent Outpatient Visits           1 year ago Type 2 diabetes mellitus without complication, without long-term current use of insulin (HCC)   [facility] Pickard, Priscille Heidelberg, MD   2 years ago Type 2 diabetes mellitus without complication, without long-term current use of insulin (HCC)   [facility] Donita Brooks, MD   2 years ago Stage 3 chronic kidney disease, unspecified whether stage 3a or 3b CKD (HCC)   [facility] Donita Brooks, MD   2 years ago Benign paroxysmal positional vertigo of right ear   [facility] Donita Brooks, MD   3 years ago Subacromial bursitis of left shoulder joint   [facility] Pickard, Priscille Heidelberg, MD       Future Appointments             In 1 week Tanya Nones, Priscille Heidelberg, MD [facility]            Passed - AST in normal range and within 360 days    AST  Date Value Ref Range Status  04/22/2023 19 10 - 35 U/L Final         Passed - ALT in normal range and within 360 days    ALT  Date Value Ref Range Status  04/22/2023 19 6 - 29 U/L Final            Requested Prescriptions  Pending Prescriptions Disp Refills   ondansetron (ZOFRAN) 4 MG tablet [Pharmacy Med Name: ONDANSETRON HCL 4 MG  TABLET] 20 tablet 0    Sig: TAKE 1 TABLET BY MOUTH EVERY 8 HOURS AS NEEDED FOR NAUSEA AND VOMITING     Not Delegated - Gastroenterology: Antiemetics - ondansetron Failed - 05/22/2023  6:20 AM      Failed - This refill cannot be delegated      Failed - Valid encounter within last 6 months    Recent Outpatient Visits           1 year ago Type 2 diabetes mellitus without complication, without long-term current use of insulin (HCC)   [facility] Donita Brooks, MD   2 years ago Type 2 diabetes mellitus without complication, without long-term current use of insulin (HCC)   [facility] Donita Brooks, MD   2 years ago Stage 3 chronic kidney disease, unspecified whether stage 3a or 3b CKD (HCC)   [facility] Donita Brooks, MD   2 years ago Benign paroxysmal positional vertigo of right ear   [facility] Donita Brooks, MD   3 years ago Subacromial bursitis of left  shoulder joint   [facility] Pickard, Priscille Heidelberg, MD       Future Appointments             In 1 week Tanya Nones, Priscille Heidelberg, MD [facility]            Passed - AST in normal range and within 360 days    AST  Date Value Ref Range Status  04/22/2023 19 10 - 35 U/L Final         Passed - ALT in normal range and within 360 days    ALT  Date Value Ref Range Status  04/22/2023 19 6 - 29 U/L Final

## 2023-05-29 ENCOUNTER — Encounter: Payer: Self-pay | Admitting: Family Medicine

## 2023-05-29 ENCOUNTER — Other Ambulatory Visit: Payer: Self-pay | Admitting: Family Medicine

## 2023-05-29 ENCOUNTER — Ambulatory Visit (INDEPENDENT_AMBULATORY_CARE_PROVIDER_SITE_OTHER): Payer: Medicare PPO | Admitting: Family Medicine

## 2023-05-29 VITALS — BP 118/64 | HR 76 | Temp 97.6°F | Ht 59.0 in | Wt 100.2 lb

## 2023-05-29 DIAGNOSIS — R233 Spontaneous ecchymoses: Secondary | ICD-10-CM | POA: Diagnosis not present

## 2023-05-29 DIAGNOSIS — R634 Abnormal weight loss: Secondary | ICD-10-CM

## 2023-05-29 DIAGNOSIS — M25511 Pain in right shoulder: Secondary | ICD-10-CM

## 2023-05-29 DIAGNOSIS — E119 Type 2 diabetes mellitus without complications: Secondary | ICD-10-CM

## 2023-05-29 DIAGNOSIS — S065X0S Traumatic subdural hemorrhage without loss of consciousness, sequela: Secondary | ICD-10-CM

## 2023-05-29 DIAGNOSIS — I10 Essential (primary) hypertension: Secondary | ICD-10-CM | POA: Diagnosis not present

## 2023-05-29 DIAGNOSIS — G8929 Other chronic pain: Secondary | ICD-10-CM | POA: Diagnosis not present

## 2023-05-29 MED ORDER — TRAMADOL HCL 50 MG PO TABS
50.0000 mg | ORAL_TABLET | Freq: Three times a day (TID) | ORAL | 0 refills | Status: DC | PRN
Start: 2023-05-29 — End: 2023-05-30

## 2023-05-29 MED ORDER — METHYLPHENIDATE HCL 10 MG PO TABS: 10.0000 mg | ORAL_TABLET | Freq: Every day | ORAL | 0 refills | Status: DC

## 2023-05-29 NOTE — Progress Notes (Signed)
Wt Readings from Last 3 Encounters:  05/29/23 100 lb 3.2 oz (45.5 kg)  05/01/23 95 lb 12.8 oz (43.5 kg)  04/22/23 93 lb 3.2 oz (42.3 kg)     Subjective:    Patient ID: Tracy Price, female    DOB: April 15, 1956, 67 y.o.   MRN: [id number]  I last saw the patient in February.  At that time we started Adderall for ADD.  Around the same time, the patient has been on Mounjaro to manage her diabetes.  She was previously on Farxiga as well.  Since I last saw the patient she is rapidly lost weight Patient has gained 2 pounds since I last saw her.  We quit Legrand Pitts, and Adderall.  I encouraged her to liberalize her diet.  Labs showed her hemoglobin was down to 10.6.  Iron levels and B12 levels were normal.  Stool was negative for blood.  Baseline hemoglobin 11-12.  The second of the but T3 and T4 were normal.  Patient continues to have some numbness and tingling and paresthesias in her legs distal to her knee.  At that  time, my plan was:  I believe the anemia and the elevated TSH could be due to to the shock to her system to rapid 30 pound weight loss.  I explained to the patient that I feel she is malnourished and that this is likely causing the anemia the subclinical hypothyroidism, and perhaps even the peripheral neuropathy.  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  Therefore I begged the patient to liberalize her diet for the next 3 weeks.  I would like her to gain 10 pounds.  I believe that as she gains weight, her TSH and hemoglobin will improve.  I plan to repeat all of his lab work at her follow-up visit in 3 weeks.  Discontinue amlodipine and hydrochlorothiazide due to low blood pressure    05/29/23 Wt Readings from Last 3 Encounters:  05/29/23 100 lb 3.2 oz (45.5 kg)  05/01/23 95 lb 12.8 oz (43.5 kg)  04/22/23 93 lb 3.2 oz (42.3 kg)   Gained 5 pounds in 1 months.  Patient would like to resume the Ritalin.  She feels  that the [facility] was the primary source of the weight loss.  She also states that she cannot function without the Ritalin.  She states that she cannot concentrate well enough to accomplish her activities of daily living.  She finds herself starting numerous task and finishing none of them.  She feels she is going "around in circles".  She denies any chest pain or shortness of breath or dyspnea on exertion.  However she does show easy bruising.  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 Past Medical History:  Diagnosis Date   Allergy    Chest pain    CKD (chronic kidney disease) stage 3, GFR 30-59 ml/min (HCC)    Diabetes mellitus without complication (HCC)    type 2   Hyperlipidemia    Hypertension    Sleep apnea 1995   had surgery to correct   Subdural hematoma [facility])     Past Surgical History:  Procedure Laterality Date   CRANIOTOMY Left 03/05/2017   Procedure: LEFT BURR HOLE  HEMATOMA EVACUATION SUBDURAL;  Surgeon: Donalee Citrin, MD;  [facility] OR;  Service: Neurosurgery;  Laterality: Left;   GASTRIC BYPASS  2005   orif arm  TONSILLECTOMY     Current Outpatient Medications on File Prior to Visit  Medication Sig Dispense Refill   alendronate (FOSAMAX) 70 MG tablet Take 1 tablet (70 mg total) by mouth every 7 (seven) days. Take with a full glass of water on an empty stomach. 4 tablet 11   ALPRAZolam (XANAX) 0.5 MG tablet Take 1 tablet (0.5 mg total) by mouth at bedtime as needed for anxiety (90 day supply). 90 tablet 0   blood glucose meter kit and supplies Dispense based on patient and insurance preference. Use up to four times daily as directed. (FOR ICD-10 E10.9, E11.9). 1 each 0   cholecalciferol (VITAMIN D3) 25 MCG (1000 UNIT) tablet Take 1,000 Units by mouth daily.     cyanocobalamin (VITAMIN B12) 1000 MCG tablet Take 5,000 mcg by mouth daily.     ferrous sulfate 325 (65 FE) MG tablet      glucose blood (ACCU-CHEK GUIDE) test strip USE TO CHECK  BLOOD SUGARS 5 TIMES PER DAY. 200 each 6   hydrochlorothiazide (MICROZIDE) 12.5 MG capsule Take 12.5 mg by mouth daily.     losartan (COZAAR) 100 MG tablet Take 1 tablet (100 mg total) by mouth daily. 90 tablet 3   Magnesium 500 MG CAPS      Melatonin 5 MG TABS Take by mouth.     metroNIDAZOLE (METROGEL) 1 % gel APPLY TO AFFECTED AREA TOPICALLY EVERY DAY 60 g 3   pantoprazole (PROTONIX) 40 MG tablet TAKE 1 TABLET BY MOUTH TWICE A DAY BEFORE A MEAL 180 tablet 3   psyllium (REGULOID) 0.52 g capsule Take 0.52 g by mouth daily.     rosuvastatin (CRESTOR) 10 MG tablet TAKE 1 TABLET BY MOUTH EVERY DAY 90 tablet 0   Selenium 200 MCG CAPS Take by mouth.     traMADol (ULTRAM) 50 MG tablet Take 1 tablet (50 mg total) by mouth 3 (three) times daily as needed. 180 tablet 0   No current facility-administered medications on file prior to visit.   No Known Allergies  Social History   Socioeconomic History   Marital status: Single    Spouse name: Not on file   Number of children: Not on file   Years of education: Not on file   Highest education level: Bachelor's degree (e.g., BA, AB, BS)  Occupational History   Not on file  Tobacco Use   Smoking status: Never   Smokeless tobacco: Never  Vaping Use   Vaping status: Never Used  Substance and Sexual Activity   Alcohol use: No   Drug use: No   Sexual activity: Not Currently  Other Topics Concern   Not on file  Social History Narrative   Not on file   Social Determinants of Health   Financial Resource Strain: Medium Risk (04/19/2023)   Overall Financial Resource Strain (CARDIA)    Difficulty of Paying Living Expenses: Somewhat hard  Food Insecurity: Food Insecurity Present (04/19/2023)   Hunger Vital Sign    Worried About Running Out of Food in the Last Year: Sometimes true    Ran Out of Food in the Last Year: Sometimes true  Transportation Needs: No Transportation Needs (04/19/2023)   PRAPARE - Administrator, Civil Service  (Medical): No    Lack of Transportation (Non-Medical): No  Physical Activity: Inactive (04/19/2023)   Exercise Vital Sign    Days of Exercise per Week: 0 days    Minutes of Exercise per Session: 20 min  Stress: No Stress  Concern Present (04/19/2023)   [employer] of Occupational Health - Occupational Stress Questionnaire    Feeling of Stress : Only a little  Social Connections: Moderately Integrated (04/19/2023)   Social Connection and Isolation Panel [NHANES]    Frequency of Communication with Friends and Family: Twice a week    Frequency of Social Gatherings with Friends and Family: Twice a week    Attends Religious Services: 1 to 4 times per year    Active Member of [employer] or Organizations: Yes    Attends Banker Meetings: 1 to 4 times per year    Marital Status: Never married  Recent Concern: Social Connections - Socially Isolated (04/17/2023)   Social Connection and Isolation Panel [NHANES]    Frequency of Communication with Friends and Family: More than three times a week    Frequency of Social Gatherings with Friends and Family: Twice a week    Attends Religious Services: Never    Database administrator or Organizations: No    Attends Banker Meetings: Never    Marital Status: Never married  Intimate Partner Violence: Not At Risk (04/17/2023)   Humiliation, Afraid, Rape, and Kick questionnaire    Fear of Current or Ex-Partner: No    Emotionally Abused: No    Physically Abused: No    Sexually Abused: No      Review of Systems  Skin:  Positive for rash.       Objective:   Physical Exam Vitals reviewed.  Constitutional:      Appearance: Normal appearance. She is cachectic. She is not ill-appearing, toxic-appearing or diaphoretic.  Cardiovascular:     Rate and Rhythm: Normal rate and regular rhythm.     Heart sounds: Normal heart sounds. No murmur heard.    No gallop.  Pulmonary:     Effort: Pulmonary effort is normal. No respiratory  distress.     Breath sounds: Normal breath sounds. No stridor. No wheezing, rhonchi or rales.  Musculoskeletal:     Right lower leg: No edema.     Left lower leg: No edema.  Neurological:     General: No focal deficit present.     Mental Status: She is alert. Mental status is at baseline.  Psychiatric:        Mood and Affect: Mood normal.        Behavior: Behavior normal.        Thought Content: Thought content normal.           Assessment & Plan:  Unintentional weight loss - Plan: CBC with Differential/Platelet, COMPLETE METABOLIC PANEL WITH GFR, TSH  Diabetes mellitus without complication (HCC) - Plan: CBC with Differential/Platelet, COMPLETE METABOLIC PANEL WITH GFR, TSH  Benign essential HTN - Plan: CBC with Differential/Platelet, COMPLETE METABOLIC PANEL WITH GFR, TSH  Easy bruising - Plan: Protime-INR, APTT  Chronic right shoulder pain - Plan: traMADol (ULTRAM) 50 MG tablet I gave the patient Ritalin 10 mg a day.  However I made an agreement that we will recheck her weight in 1 month.  If she starts to lose weight and then discontinuing the medicine.  Patient states that she has been taking hydrochlorothiazide as needed for leg swelling.  I do not see any leg swelling on exam today so I discouraged her from doing this.  Her blood pressure does not require hydrochlorothiazide so I recommended that we discontinue the medication.  I will recheck her thyroid as I discussed at her last visit.  I will  also check a CMP, CBC, INR, and a PTT given the easy bruising.  I suspect that this is likely due to the lack of subcutaneous fat after her weight loss.  The bruising is limited to her arms.

## 2023-05-30 ENCOUNTER — Encounter: Payer: Self-pay | Admitting: Family Medicine

## 2023-05-30 ENCOUNTER — Other Ambulatory Visit: Payer: Self-pay | Admitting: Medical Genetics

## 2023-05-30 ENCOUNTER — Other Ambulatory Visit: Payer: Self-pay

## 2023-05-30 ENCOUNTER — Other Ambulatory Visit: Payer: Self-pay | Admitting: Family Medicine

## 2023-05-30 DIAGNOSIS — Z006 Encounter for examination for normal comparison and control in clinical research program: Secondary | ICD-10-CM

## 2023-05-30 DIAGNOSIS — E039 Hypothyroidism, unspecified: Secondary | ICD-10-CM

## 2023-05-30 DIAGNOSIS — M810 Age-related osteoporosis without current pathological fracture: Secondary | ICD-10-CM

## 2023-05-30 DIAGNOSIS — G8929 Other chronic pain: Secondary | ICD-10-CM

## 2023-05-30 DIAGNOSIS — I10 Essential (primary) hypertension: Secondary | ICD-10-CM

## 2023-05-30 DIAGNOSIS — K219 Gastro-esophageal reflux disease without esophagitis: Secondary | ICD-10-CM

## 2023-05-30 DIAGNOSIS — E785 Hyperlipidemia, unspecified: Secondary | ICD-10-CM

## 2023-05-30 DIAGNOSIS — N183 Chronic kidney disease, stage 3 unspecified: Secondary | ICD-10-CM

## 2023-05-30 LAB — COMPLETE METABOLIC PANEL WITH GFR
AG Ratio: 1.7 (calc) (ref 1.0–2.5)
ALT: 33 U/L — ABNORMAL HIGH (ref 6–29)
AST: 32 U/L (ref 10–35)
Albumin: 4 g/dL (ref 3.6–5.1)
Alkaline phosphatase (APISO): 51 U/L (ref 37–153)
BUN/Creatinine Ratio: 31 (calc) — ABNORMAL HIGH (ref 6–22)
BUN: 33 mg/dL — ABNORMAL HIGH (ref 7–25)
CO2: 27 mmol/L (ref 20–32)
Calcium: 8.8 mg/dL (ref 8.6–10.4)
Chloride: 101 mmol/L (ref 98–110)
Creat: 1.08 mg/dL — ABNORMAL HIGH (ref 0.50–1.05)
Globulin: 2.4 g/dL (calc) (ref 1.9–3.7)
Glucose, Bld: 93 mg/dL (ref 65–99)
Potassium: 3.5 mmol/L (ref 3.5–5.3)
Sodium: 140 mmol/L (ref 135–146)
Total Bilirubin: 0.4 mg/dL (ref 0.2–1.2)
Total Protein: 6.4 g/dL (ref 6.1–8.1)
eGFR: 57 mL/min/{1.73_m2} — ABNORMAL LOW (ref >=60)

## 2023-05-30 LAB — CBC WITH DIFFERENTIAL/PLATELET
Absolute Monocytes: 502 {cells}/uL (ref 200–950)
Basophils Absolute: 59 {cells}/uL (ref 0–200)
Basophils Relative: 1 %
Eosinophils Absolute: 301 {cells}/uL (ref 15–500)
Eosinophils Relative: 5.1 %
HCT: 33.8 % — ABNORMAL LOW (ref 35.0–45.0)
Hemoglobin: 11.2 g/dL — ABNORMAL LOW (ref 11.7–15.5)
Lymphs Abs: 1540 {cells}/uL (ref 850–3900)
MCH: 34.3 pg — ABNORMAL HIGH (ref 27.0–33.0)
MCHC: 33.1 g/dL (ref 32.0–36.0)
MCV: 103.4 fL — ABNORMAL HIGH (ref 80.0–100.0)
MPV: 10.9 fL (ref 7.5–12.5)
Monocytes Relative: 8.5 %
Neutro Abs: 3499 {cells}/uL (ref 1500–7800)
Neutrophils Relative %: 59.3 %
Platelets: 229 10*3/uL (ref 140–400)
RBC: 3.27 10*6/uL — ABNORMAL LOW (ref 3.80–5.10)
RDW: 11.1 % (ref 11.0–15.0)
Total Lymphocyte: 26.1 %
WBC: 5.9 10*3/uL (ref 3.8–10.8)

## 2023-05-30 LAB — TSH: TSH: 10.04 mIU/L — ABNORMAL HIGH (ref 0.40–4.50)

## 2023-05-30 LAB — APTT: aPTT: 25 s (ref 23–32)

## 2023-05-30 LAB — PROTIME-INR
INR: 1
Prothrombin Time: 10.7 s (ref 9.0–11.5)

## 2023-05-30 MED ORDER — PANTOPRAZOLE SODIUM 40 MG PO TBEC
DELAYED_RELEASE_TABLET | ORAL | 3 refills | Status: DC
Start: 2023-05-30 — End: 2023-12-19

## 2023-05-30 MED ORDER — LOSARTAN POTASSIUM 100 MG PO TABS
100.0000 mg | ORAL_TABLET | Freq: Every day | ORAL | 3 refills | Status: DC
Start: 2023-05-30 — End: 2023-12-19

## 2023-05-30 MED ORDER — LEVOTHYROXINE SODIUM 50 MCG PO TABS
50.0000 ug | ORAL_TABLET | Freq: Every day | ORAL | 1 refills | Status: DC
Start: 2023-05-30 — End: 2023-10-09

## 2023-05-30 MED ORDER — TRAMADOL HCL 50 MG PO TABS: 100.0000 mg | ORAL_TABLET | Freq: Three times a day (TID) | ORAL | 0 refills | Status: DC

## 2023-05-30 MED ORDER — ROSUVASTATIN CALCIUM 10 MG PO TABS
10.0000 mg | ORAL_TABLET | Freq: Every day | ORAL | 0 refills | Status: DC
Start: 2023-05-30 — End: 2023-12-22

## 2023-05-30 MED ORDER — ALENDRONATE SODIUM 70 MG PO TABS
70.0000 mg | ORAL_TABLET | ORAL | 11 refills | Status: DC
Start: 2023-05-30 — End: 2023-12-18

## 2023-06-02 ENCOUNTER — Encounter: Payer: Self-pay | Admitting: Family Medicine

## 2023-06-02 MED ORDER — ALPRAZOLAM 0.5 MG PO TABS: 0.5000 mg | ORAL_TABLET | Freq: Every evening | ORAL | 0 refills | Status: DC | PRN

## 2023-06-02 NOTE — Telephone Encounter (Signed)
Pharmacy also requesting 60 day supply. Please see previous message in thread.

## 2023-06-02 NOTE — Telephone Encounter (Signed)
Pharmacy sent fax to request an alternative to traMADol (ULTRAM) 50 MG tablet   Medication not covered and needs a prior auth. Patient paid cash last time but isn't able to do that any more.   Please advise pharmacist at [phone].

## 2023-06-04 NOTE — Telephone Encounter (Signed)
Pharmacy sent fax to request an alternative to traMADol (ULTRAM) 50 MG tablet  Tramadol not covered by patient's insurance and requires a PA  Pharmacy:   [id number] [place], [place] - [address] [place] [address] Phone: [phone]  Fax: [phone] DEA #: [id number]    Please advise pharmacist

## 2023-06-11 ENCOUNTER — Ambulatory Visit (INDEPENDENT_AMBULATORY_CARE_PROVIDER_SITE_OTHER): Payer: Medicare PPO

## 2023-06-11 VITALS — Ht 59.0 in | Wt 106.8 lb

## 2023-06-11 DIAGNOSIS — E119 Type 2 diabetes mellitus without complications: Secondary | ICD-10-CM | POA: Diagnosis not present

## 2023-06-11 LAB — HM DIABETES EYE EXAM

## 2023-06-11 NOTE — Progress Notes (Cosign Needed Addendum)
Pt here for diabetic eye exam. Explained procedure and questions answered. Pt verbalized understanding of all. Exam performed and pt tolerated well. Mjp,lpn  Tracy Price arrived 06/11/2023 and has given verbal consent to obtain images and complete their overdue diabetic retinal screening.  The images have been sent to an ophthalmologist or optometrist for review and interpretation.  Results will be sent back to Donita Brooks, MD for review.  Patient has been informed they will be contacted when we receive the results via telephone or MyChart

## 2023-06-16 ENCOUNTER — Other Ambulatory Visit: Payer: Self-pay | Admitting: Family Medicine

## 2023-06-16 DIAGNOSIS — Z1231 Encounter for screening mammogram for malignant neoplasm of breast: Secondary | ICD-10-CM

## 2023-06-16 DIAGNOSIS — L570 Actinic keratosis: Secondary | ICD-10-CM | POA: Diagnosis not present

## 2023-06-16 DIAGNOSIS — L814 Other melanin hyperpigmentation: Secondary | ICD-10-CM | POA: Diagnosis not present

## 2023-06-20 ENCOUNTER — Telehealth: Payer: Self-pay | Admitting: Family Medicine

## 2023-06-20 NOTE — Telephone Encounter (Signed)
Received call from [place] from [facility] to follow up on orders for kidney function, cholesterol, and A1C tests.   Inetta Fermo stated the Kidney function kit was ordered but patient is unsure of where the kit will come from.  Inetta Fermo advised the cholesterol and A1C tests can only be ordered if provider opts patient in for [employer] (same company) for a [employer]. Requesting for provider to contact [employer] at [phone] or use website [email or web address].   Patient's main concern is to find out whether or not provider has already ordered the Stars Kit. Please advise patient with answer, and of where to expect kidney function kit from  Patient's contact number is [phone].

## 2023-06-23 NOTE — Progress Notes (Signed)
I have collaborated with the care management provider regarding care management and care coordination activities outlined in this encounter and have reviewed this encounter including documentation in the note and care plan. I am certifying that I agree with the content of this note and encounter as supervising physician.  

## 2023-06-27 ENCOUNTER — Encounter: Payer: Self-pay | Admitting: Family Medicine

## 2023-06-27 ENCOUNTER — Other Ambulatory Visit: Payer: Self-pay | Admitting: Family Medicine

## 2023-06-27 MED ORDER — METHYLPHENIDATE HCL ER 20 MG PO TBCR: 20.0000 mg | EXTENDED_RELEASE_TABLET | Freq: Every day | ORAL | 0 refills | Status: DC

## 2023-07-08 ENCOUNTER — Encounter: Payer: Self-pay | Admitting: Family Medicine

## 2023-07-18 ENCOUNTER — Encounter: Payer: Self-pay | Admitting: Family Medicine

## 2023-07-21 ENCOUNTER — Other Ambulatory Visit: Payer: Self-pay | Admitting: Family Medicine

## 2023-07-22 NOTE — Telephone Encounter (Signed)
Requested medication (s) are due for refill today -yes  Requested medication (s) are on the active medication list -yes  Future visit scheduled -no  Last refill: 03/07/23 60g 3RF  Notes to clinic: off protocol- provider review   Requested Prescriptions  Pending Prescriptions Disp Refills   metroNIDAZOLE (METROGEL) 1 % gel [Pharmacy Med Name: METRONIDAZOLE TOPICAL 1% GEL] 60 g 3    Sig: APPLY TO AFFECTED AREA TOPICALLY EVERY DAY     Off-Protocol Failed - 07/21/2023  3:57 PM      Failed - Medication not assigned to a protocol, review manually.      Failed - Valid encounter within last 12 months    Recent Outpatient Visits           1 year ago Type 2 diabetes mellitus without complication, without long-term current use of insulin (HCC)   [facility] Pickard, Priscille Heidelberg, MD   2 years ago Type 2 diabetes mellitus without complication, without long-term current use of insulin (HCC)   [facility] Donita Brooks, MD   2 years ago Stage 3 chronic kidney disease, unspecified whether stage 3a or 3b CKD (HCC)   [facility] Donita Brooks, MD   2 years ago Benign paroxysmal positional vertigo of right ear   [facility] Donita Brooks, MD   3 years ago Subacromial bursitis of left shoulder joint   [facility] Pickard, Priscille Heidelberg, MD                 Requested Prescriptions  Pending Prescriptions Disp Refills   metroNIDAZOLE (METROGEL) 1 % gel [Pharmacy Med Name: METRONIDAZOLE TOPICAL 1% GEL] 60 g 3    Sig: APPLY TO AFFECTED AREA TOPICALLY EVERY DAY     Off-Protocol Failed - 07/21/2023  3:57 PM      Failed - Medication not assigned to a protocol, review manually.      Failed - Valid encounter within last 12 months    Recent Outpatient Visits           1 year ago Type 2 diabetes mellitus without complication, without long-term current use of insulin (HCC)   [facility]  Pickard, Priscille Heidelberg, MD   2 years ago Type 2 diabetes mellitus without complication, without long-term current use of insulin (HCC)   [facility] Donita Brooks, MD   2 years ago Stage 3 chronic kidney disease, unspecified whether stage 3a or 3b CKD (HCC)   [facility] Donita Brooks, MD   2 years ago Benign paroxysmal positional vertigo of right ear   [facility] Donita Brooks, MD   3 years ago Subacromial bursitis of left shoulder joint   [facility] Pickard, Priscille Heidelberg, MD

## 2023-08-06 DIAGNOSIS — E109 Type 1 diabetes mellitus without complications: Secondary | ICD-10-CM | POA: Diagnosis not present

## 2023-08-09 ENCOUNTER — Encounter: Payer: Self-pay | Admitting: Family Medicine

## 2023-08-11 ENCOUNTER — Other Ambulatory Visit (HOSPITAL_COMMUNITY): Payer: Medicare PPO

## 2023-08-12 ENCOUNTER — Other Ambulatory Visit: Payer: Self-pay | Admitting: Family Medicine

## 2023-08-12 DIAGNOSIS — G8929 Other chronic pain: Secondary | ICD-10-CM

## 2023-08-12 MED ORDER — TRAMADOL HCL 50 MG PO TABS
100.0000 mg | ORAL_TABLET | Freq: Three times a day (TID) | ORAL | 0 refills | Status: DC
Start: 2023-08-12 — End: 2023-10-07

## 2023-08-13 ENCOUNTER — Encounter: Payer: Self-pay | Admitting: Gastroenterology

## 2023-08-14 ENCOUNTER — Ambulatory Visit
Admission: RE | Admit: 2023-08-14 | Discharge: 2023-08-14 | Disposition: A | Discharge status: Home / Self Care | Payer: Medicare PPO | Source: Ambulatory Visit | Attending: Family Medicine | Admitting: Family Medicine

## 2023-08-14 DIAGNOSIS — Z1231 Encounter for screening mammogram for malignant neoplasm of breast: Secondary | ICD-10-CM

## 2023-08-18 ENCOUNTER — Encounter: Payer: Self-pay | Admitting: Gastroenterology

## 2023-08-18 ENCOUNTER — Encounter: Payer: Self-pay | Admitting: Family Medicine

## 2023-08-19 ENCOUNTER — Other Ambulatory Visit: Payer: Self-pay | Admitting: Family Medicine

## 2023-08-19 MED ORDER — AMPHETAMINE-DEXTROAMPHET ER 25 MG PO CP24: 25.0000 mg | ORAL_CAPSULE | ORAL | 0 refills | Status: DC

## 2023-08-24 ENCOUNTER — Encounter: Payer: Self-pay | Admitting: Family Medicine

## 2023-09-01 ENCOUNTER — Other Ambulatory Visit: Payer: Medicare PPO

## 2023-09-01 DIAGNOSIS — I10 Essential (primary) hypertension: Secondary | ICD-10-CM

## 2023-09-01 DIAGNOSIS — E7800 Pure hypercholesterolemia, unspecified: Secondary | ICD-10-CM | POA: Diagnosis not present

## 2023-09-01 DIAGNOSIS — E119 Type 2 diabetes mellitus without complications: Secondary | ICD-10-CM

## 2023-09-02 ENCOUNTER — Encounter (INDEPENDENT_AMBULATORY_CARE_PROVIDER_SITE_OTHER): Payer: Self-pay

## 2023-09-02 LAB — CBC WITH DIFFERENTIAL/PLATELET
Absolute Lymphocytes: 1376 {cells}/uL (ref 850–3900)
Absolute Monocytes: 600 {cells}/uL (ref 200–950)
Basophils Absolute: 68 {cells}/uL (ref 0–200)
Basophils Relative: 0.9 %
Eosinophils Absolute: 578 {cells}/uL — ABNORMAL HIGH (ref 15–500)
Eosinophils Relative: 7.6 %
HCT: 35.6 % (ref 35.0–45.0)
Hemoglobin: 11.8 g/dL (ref 11.7–15.5)
MCH: 32.7 pg (ref 27.0–33.0)
MCHC: 33.1 g/dL (ref 32.0–36.0)
MCV: 98.6 fL (ref 80.0–100.0)
MPV: 12.1 fL (ref 7.5–12.5)
Monocytes Relative: 7.9 %
Neutro Abs: 4978 {cells}/uL (ref 1500–7800)
Neutrophils Relative %: 65.5 %
Platelets: 174 10*3/uL (ref 140–400)
RBC: 3.61 10*6/uL — ABNORMAL LOW (ref 3.80–5.10)
RDW: 12.1 % (ref 11.0–15.0)
Total Lymphocyte: 18.1 %
WBC: 7.6 10*3/uL (ref 3.8–10.8)

## 2023-09-02 LAB — LIPID PANEL
Cholesterol: 141 mg/dL (ref <200)
HDL: 80 mg/dL (ref >=50)
LDL Cholesterol (Calc): 45 mg/dL
Non-HDL Cholesterol (Calc): 61 mg/dL (ref <130)
Total CHOL/HDL Ratio: 1.8 (calc) (ref <5.0)
Triglycerides: 76 mg/dL (ref <150)

## 2023-09-02 LAB — PROTEIN / CREATININE RATIO, URINE
Creatinine, Urine: 51 mg/dL (ref 20–275)
Protein/Creat Ratio: 196 mg/g{creat} — ABNORMAL HIGH (ref 24–184)
Protein/Creatinine Ratio: 0.196 mg/mg{creat} — ABNORMAL HIGH (ref 0.024–0.184)
Total Protein, Urine: 10 mg/dL (ref 5–24)

## 2023-09-02 LAB — COMPLETE METABOLIC PANEL WITH GFR
AG Ratio: 1.7 (calc) (ref 1.0–2.5)
ALT: 52 U/L — ABNORMAL HIGH (ref 6–29)
AST: 43 U/L — ABNORMAL HIGH (ref 10–35)
Albumin: 4.2 g/dL (ref 3.6–5.1)
Alkaline phosphatase (APISO): 62 U/L (ref 37–153)
BUN/Creatinine Ratio: 32 (calc) — ABNORMAL HIGH (ref 6–22)
BUN: 44 mg/dL — ABNORMAL HIGH (ref 7–25)
CO2: 28 mmol/L (ref 20–32)
Calcium: 8.9 mg/dL (ref 8.6–10.4)
Chloride: 97 mmol/L — ABNORMAL LOW (ref 98–110)
Creat: 1.38 mg/dL — ABNORMAL HIGH (ref 0.50–1.05)
Globulin: 2.5 g/dL (ref 1.9–3.7)
Glucose, Bld: 105 mg/dL — ABNORMAL HIGH (ref 65–99)
Potassium: 4.5 mmol/L (ref 3.5–5.3)
Sodium: 133 mmol/L — ABNORMAL LOW (ref 135–146)
Total Bilirubin: 0.6 mg/dL (ref 0.2–1.2)
Total Protein: 6.7 g/dL (ref 6.1–8.1)
eGFR: 42 mL/min/{1.73_m2} — ABNORMAL LOW (ref >=60)

## 2023-09-02 LAB — HEMOGLOBIN A1C
Hgb A1c MFr Bld: 6.2 %{Hb} — ABNORMAL HIGH (ref <5.7)
Mean Plasma Glucose: 131 mg/dL
eAG (mmol/L): 7.3 mmol/L

## 2023-09-03 ENCOUNTER — Encounter: Payer: Self-pay | Admitting: Family Medicine

## 2023-09-04 ENCOUNTER — Other Ambulatory Visit: Payer: Self-pay | Admitting: Family Medicine

## 2023-09-04 ENCOUNTER — Encounter: Payer: Self-pay | Admitting: Family Medicine

## 2023-09-04 MED ORDER — AMPHETAMINE-DEXTROAMPHET ER 25 MG PO CP24: 25.0000 mg | ORAL_CAPSULE | ORAL | 0 refills | Status: DC

## 2023-09-07 ENCOUNTER — Other Ambulatory Visit: Payer: Self-pay | Admitting: Family Medicine

## 2023-09-08 NOTE — Telephone Encounter (Signed)
Requested medication (s) are due for refill today - yes  Requested medication (s) are on the active medication list -yes  Future visit scheduled -no  Last refill: 06/02/23 #90  Notes to clinic: non delegated Rx  Requested Prescriptions  Pending Prescriptions Disp Refills   ALPRAZolam (XANAX) 0.5 MG tablet [Pharmacy Med Name: ALPRAZOLAM 0.5 MG TABLET] 30 tablet     Sig: TAKE 1 TABLET (0.5 MG TOTAL) BY MOUTH AT BEDTIME AS NEEDED FOR ANXIETY     Not Delegated - Psychiatry: Anxiolytics/Hypnotics 2 Failed - 09/07/2023 11:49 AM      Failed - This refill cannot be delegated      Failed - Urine Drug Screen completed in last 360 days      Failed - Valid encounter within last 6 months    Recent Outpatient Visits           1 year ago Type 2 diabetes mellitus without complication, without long-term current use of insulin (HCC)   [facility] Donita Brooks, MD   2 years ago Type 2 diabetes mellitus without complication, without long-term current use of insulin (HCC)   [facility] Donita Brooks, MD   2 years ago Stage 3 chronic kidney disease, unspecified whether stage 3a or 3b CKD (HCC)   [facility] Donita Brooks, MD   3 years ago Benign paroxysmal positional vertigo of right ear   [facility] Donita Brooks, MD   3 years ago Subacromial bursitis of left shoulder joint   [facility] Pickard, Priscille Heidelberg, MD              Passed - Patient is not pregnant         Requested Prescriptions  Pending Prescriptions Disp Refills   ALPRAZolam (XANAX) 0.5 MG tablet [Pharmacy Med Name: ALPRAZOLAM 0.5 MG TABLET] 30 tablet     Sig: TAKE 1 TABLET (0.5 MG TOTAL) BY MOUTH AT BEDTIME AS NEEDED FOR ANXIETY     Not Delegated - Psychiatry: Anxiolytics/Hypnotics 2 Failed - 09/07/2023 11:49 AM      Failed - This refill cannot be delegated      Failed - Urine Drug Screen completed in last 360 days       Failed - Valid encounter within last 6 months    Recent Outpatient Visits           1 year ago Type 2 diabetes mellitus without complication, without long-term current use of insulin (HCC)   [facility] Donita Brooks, MD   2 years ago Type 2 diabetes mellitus without complication, without long-term current use of insulin (HCC)   [facility] Donita Brooks, MD   2 years ago Stage 3 chronic kidney disease, unspecified whether stage 3a or 3b CKD (HCC)   [facility] Donita Brooks, MD   3 years ago Benign paroxysmal positional vertigo of right ear   [facility] Donita Brooks, MD   3 years ago Subacromial bursitis of left shoulder joint   [facility] Pickard, Priscille Heidelberg, MD              Passed - Patient is not pregnant

## 2023-09-11 ENCOUNTER — Other Ambulatory Visit: Payer: Self-pay | Admitting: Family Medicine

## 2023-09-11 MED ORDER — ALPRAZOLAM 0.5 MG PO TABS: 0.5000 mg | ORAL_TABLET | Freq: Every evening | ORAL | 0 refills | Status: DC | PRN

## 2023-09-17 ENCOUNTER — Other Ambulatory Visit (HOSPITAL_COMMUNITY): Payer: Medicare PPO

## 2023-09-23 ENCOUNTER — Ambulatory Visit: Payer: Medicare PPO | Admitting: Family Medicine

## 2023-09-24 ENCOUNTER — Telehealth: Payer: Self-pay | Admitting: *Deleted

## 2023-09-24 ENCOUNTER — Ambulatory Visit (AMBULATORY_SURGERY_CENTER): Payer: Medicare PPO | Admitting: *Deleted

## 2023-09-24 VITALS — Ht 59.0 in | Wt 108.0 lb

## 2023-09-24 DIAGNOSIS — Z860100 Personal history of colon polyps, unspecified: Secondary | ICD-10-CM

## 2023-09-24 MED ORDER — NA SULFATE-K SULFATE-MG SULF 17.5-3.13-1.6 GM/177ML PO SOLN: 1.0000 | Freq: Once | ORAL | 0 refills | Status: AC

## 2023-09-24 NOTE — Telephone Encounter (Signed)
,  Attempt to reach pt for pre-visit. LM with call back #. Will attempt to reach again in 5 min due to no other # listed in profile 2nd attempt reached pt

## 2023-09-24 NOTE — Progress Notes (Signed)
Pt's name and DOB verified at the beginning of the pre-visit wit 2 identifiers  Pt denies any difficulty with ambulating,sitting, laying down or rolling side to side  Pt has no issues with ambulation   Pt has no issues moving head neck or swallowing  No egg or soy allergy known to patient   No issues known to pt with past sedation with any surgeries or procedures  Pt denies having issues being intubated  No FH of Malignant Hyperthermia  Pt is not on diet pills or shots  Pt is not on home 02   Pt is not on blood thinners   Pt denies issues with constipation   Pt is not on dialysis  Pt denise any abnormal heart rhythms   Pt denies any upcoming cardiac testing  Pt encouraged to use to use Singlecare or Goodrx to reduce cost   Patient's chart reviewed by Cathlyn Parsons CNRA prior to pre-visit and patient appropriate for the [facility].  Pre-visit completed and red dot placed by patient's name on their procedure day (on provider's schedule).  .  Visit by phone  Pt states weight is 108 lb  Instructed pt why it is important to and  to call if they have any changes in health or new medications. Directed them to the # given and on instructions.     Instructions reviewed. Pt given both [facility] main # and MD on call # prior to instructions.  Pt states understanding. Instructed to review again prior to procedure. Pt states they will.   Instructions sent by mail with coupon and by My Chart   Coupon sent via text to mobile phone and pt verified they received it

## 2023-09-26 NOTE — Telephone Encounter (Signed)
Patient called and stated she would like to know what she allowed to eat due her being a vegetarian. Patient is requesting a call back and stated it was ok to leave a detailed voicemail. Please advise.

## 2023-09-29 NOTE — Telephone Encounter (Signed)
Instructed pt on VM that she can have anything that is not on that list as long as it does not have seeds and to cook it till soft.

## 2023-10-05 ENCOUNTER — Other Ambulatory Visit: Payer: Self-pay | Admitting: Family Medicine

## 2023-10-05 DIAGNOSIS — G8929 Other chronic pain: Secondary | ICD-10-CM

## 2023-10-06 ENCOUNTER — Encounter: Payer: Self-pay | Admitting: Family Medicine

## 2023-10-06 ENCOUNTER — Ambulatory Visit: Payer: Medicare PPO | Admitting: Family Medicine

## 2023-10-06 VITALS — BP 120/82 | HR 92 | Temp 98.4°F | Ht 59.0 in | Wt 111.5 lb

## 2023-10-06 DIAGNOSIS — E039 Hypothyroidism, unspecified: Secondary | ICD-10-CM | POA: Diagnosis not present

## 2023-10-06 DIAGNOSIS — R7989 Other specified abnormal findings of blood chemistry: Secondary | ICD-10-CM | POA: Diagnosis not present

## 2023-10-06 DIAGNOSIS — E119 Type 2 diabetes mellitus without complications: Secondary | ICD-10-CM | POA: Diagnosis not present

## 2023-10-06 DIAGNOSIS — N183 Chronic kidney disease, stage 3 unspecified: Secondary | ICD-10-CM | POA: Diagnosis not present

## 2023-10-06 NOTE — Progress Notes (Signed)
Subjective:    Patient ID: Tracy Price, female    DOB: 06/10/1956, 67 y.o.   MRN: [id number]  I last saw the patient in February.  At that time we started Adderall for ADD.  Around the same time, the patient has been on Mounjaro to manage her diabetes.  She was previously on Farxiga as well.  Since I last saw the patient she is rapidly lost weight Patient has gained 2 pounds since I last saw her.  We quit Legrand Pitts, and Adderall.  I encouraged her to liberalize her diet.  Labs showed her hemoglobin was down to 10.6.  Iron levels and B12 levels were normal.  Stool was negative for blood.  Baseline hemoglobin 11-12.  The second of the but T3 and T4 were normal.  Patient continues to have some numbness and tingling and paresthesias in her legs distal to her knee.  At that  time, my plan was: I believe the anemia and the elevated TSH could be due to to the shock to her system to rapid 30 pound weight loss.  I explained to the patient that I feel she is malnourished and that this is likely causing the anemia the subclinical hypothyroidism, and perhaps even the peripheral neuropathy.  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  Therefore I begged the patient to liberalize her diet for the next 3 weeks.  I would like her to gain 10 pounds.  I believe that as she gains weight, her TSH and hemoglobin will improve.  I plan to repeat all of his lab work at her follow-up visit in 3 weeks.  Discontinue amlodipine and hydrochlorothiazide due to low blood pressure    05/29/23 Gained 5 pounds in 1 months.  Patient would like to resume the Ritalin.  She feels that the [facility] was the primary source of the weight loss.  She also states that she cannot function without the Ritalin.  She states that she cannot concentrate well enough to accomplish her activities of daily living.  She finds herself starting numerous task and finishing none of  them.  She feels she is going "around in circles".  She denies any chest pain or shortness of breath or dyspnea on exertion.  However she does show easy bruising.  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  At that time, my plan was: I gave the patient Ritalin 10 mg a day.  However I made an agreement that we will recheck her weight in 1 month.  If she starts to lose weight and then discontinuing the medicine.  Patient states that she has been taking hydrochlorothiazide as needed for leg swelling.  I do not see any leg swelling on exam today so I discouraged her from doing this.  Her blood pressure does not require hydrochlorothiazide so I recommended that we discontinue the medication.  I will recheck her thyroid as I discussed at her last visit.  I will also check a CMP, CBC, INR, and a PTT given the easy bruising.  I suspect that this is likely due to the lack of subcutaneous fat after her weight loss.  The bruising is limited to her arms.   Wt Readings from Last 3 Encounters:  10/06/23 111 lb 8 oz (50.6 kg)  09/24/23 108 lb (49 kg)  06/11/23 106 lb 12.8 oz (48.4 kg)   09/26/23 Patient presents today  for follow-up.  Patient states that her blood sugars have been out of control.  She states that approximately 3 months ago her sugars were over 300 frequently after meals 2 hours later.  Therefore she resumed Januvia.  However her blood sugar on her continuous blood glucose monitoring system says that her hemoglobin A1c should be 6.1.  She is in range 90% of the time with less than 1% of the time the sugar being reported as high.  This matches with her recent hemoglobin A1c of 6.2.  I do not believe the Januvia would be sufficiently strong to bring sugars out of the 300s on its own.  I question the validity of the sugars and she was recording.  Her liver function test were also elevated.  Patient states that she was taking a lot of Tylenol.  She was reporting significant pain in  her shoulders and her back.  This was despite taking tramadol 2 tablets every 8 hours.  She has been taking 6 tablets a day of tramadol which equates to 100 mg every 8 hours.  However the patient states that she stopped the Tylenol for 1 month.  She is on a statin.  There is no evidence of jaundice.  She denies any alcohol.   Past Medical History:  Diagnosis Date   ADD (attention deficit disorder)    Allergy    Anemia    Chest pain    CKD (chronic kidney disease) stage 3, GFR 30-59 ml/min (HCC)    Diabetes mellitus without complication (HCC)    type 2   GERD (gastroesophageal reflux disease)    Hyperlipidemia    Hypertension    Hypothyroidism    Osteoporosis    Sleep apnea 1995   had surgery to correct   Subdural hematoma [facility])     Past Surgical History:  Procedure Laterality Date   COLONOSCOPY     CRANIOTOMY Left 03/05/2017   Procedure: LEFT BURR HOLE  HEMATOMA EVACUATION SUBDURAL;  Surgeon: Donalee Citrin, MD;  Location: [facility] OR;  Service: Neurosurgery;  Laterality: Left;   GASTRIC BYPASS  2005   orif arm     TONSILLECTOMY     Current Outpatient Medications on File Prior to Visit  Medication Sig Dispense Refill   alendronate (FOSAMAX) 70 MG tablet Take 1 tablet (70 mg total) by mouth every 7 (seven) days. Take with a full glass of water on an empty stomach. 4 tablet 11   ALPRAZolam (XANAX) 0.5 MG tablet Take 1 tablet (0.5 mg total) by mouth at bedtime as needed for anxiety (90 day supply). 90 tablet 0   amphetamine-dextroamphetamine (ADDERALL XR) 25 MG 24 hr capsule Take 1 capsule by mouth every morning. 30 capsule 0   blood glucose meter kit and supplies Dispense based on patient and insurance preference. Use up to four times daily as directed. (FOR ICD-10 E10.9, E11.9). 1 each 0   cholecalciferol (VITAMIN D3) 25 MCG (1000 UNIT) tablet Take 1,000 Units by mouth daily.     Cyanocobalamin (VITAMIN B 12 PO) Take by mouth. 5000 mcg     cyanocobalamin (VITAMIN B12) 1000 MCG tablet Take  5,000 mcg by mouth daily.     ferrous sulfate 325 (65 FE) MG tablet      glucose blood (ACCU-CHEK GUIDE) test strip USE TO CHECK BLOOD SUGARS 5 TIMES PER DAY. 200 each 6   levothyroxine (SYNTHROID) 50 MCG tablet Take 1 tablet (50 mcg total) by mouth daily. 90 tablet 1   losartan (COZAAR)  100 MG tablet Take 1 tablet (100 mg total) by mouth daily. 90 tablet 3   Magnesium 500 MG CAPS      Melatonin 5 MG TABS Take by mouth.     metroNIDAZOLE (METROGEL) 1 % gel APPLY TO AFFECTED AREA TOPICALLY EVERY DAY (Patient not taking: Reported on 09/24/2023) 60 g 3   OVER THE COUNTER MEDICATION Vitamin D 250 mg     pantoprazole (PROTONIX) 40 MG tablet TAKE 1 TABLET BY MOUTH TWICE A DAY BEFORE A MEAL 180 tablet 3   psyllium (REGULOID) 0.52 g capsule Take 0.52 g by mouth daily.     rosuvastatin (CRESTOR) 10 MG tablet Take 1 tablet (10 mg total) by mouth daily. 90 tablet 0   Selenium 200 MCG CAPS Take by mouth.     sitaGLIPtin (JANUVIA) 100 MG tablet Take 100 mg by mouth daily.     traMADol (ULTRAM) 50 MG tablet Take 2 tablets (100 mg total) by mouth every 8 (eight) hours. 180 tablet 0   No current facility-administered medications on file prior to visit.   No Known Allergies  Social History   Socioeconomic History   Marital status: Single    Spouse name: Not on file   Number of children: Not on file   Years of education: Not on file   Highest education level: Bachelor's degree (e.g., BA, AB, BS)  Occupational History   Not on file  Tobacco Use   Smoking status: Never   Smokeless tobacco: Never  Vaping Use   Vaping status: Never Used  Substance and Sexual Activity   Alcohol use: No   Drug use: No   Sexual activity: Not Currently  Other Topics Concern   Not on file  Social History Narrative   Not on file   Social Drivers of Health   Financial Resource Strain: Medium Risk (09/30/2023)   Overall Financial Resource Strain (CARDIA)    Difficulty of Paying Living Expenses: Somewhat hard  Food  Insecurity: Food Insecurity Present (09/30/2023)   Hunger Vital Sign    Worried About Running Out of Food in the Last Year: Sometimes true    Ran Out of Food in the Last Year: Sometimes true  Transportation Needs: Unmet Transportation Needs (09/30/2023)   PRAPARE - Transportation    Lack of Transportation (Medical): Yes    Lack of Transportation (Non-Medical): Yes  Physical Activity: Insufficiently Active (09/30/2023)   Exercise Vital Sign    Days of Exercise per Week: 2 days    Minutes of Exercise per Session: 30 min  Stress: No Stress Concern Present (09/30/2023)   [employer] of Occupational Health - Occupational Stress Questionnaire    Feeling of Stress : Only a little  Social Connections: Moderately Integrated (09/30/2023)   Social Connection and Isolation Panel [NHANES]    Frequency of Communication with Friends and Family: More than three times a week    Frequency of Social Gatherings with Friends and Family: Twice a week    Attends Religious Services: 1 to 4 times per year    Active Member of [employer] or Organizations: Yes    Attends Banker Meetings: More than 4 times per year    Marital Status: Never married  Intimate Partner Violence: Not At Risk (04/17/2023)   Humiliation, Afraid, Rape, and Kick questionnaire    Fear of Current or Ex-Partner: No    Emotionally Abused: No    Physically Abused: No    Sexually Abused: No      Review  of Systems  Skin:  Positive for rash.       Objective:   Physical Exam Vitals reviewed.  Constitutional:      Appearance: Normal appearance. She is cachectic. She is not ill-appearing, toxic-appearing or diaphoretic.  Cardiovascular:     Rate and Rhythm: Normal rate and regular rhythm.     Heart sounds: Normal heart sounds. No murmur heard.    No gallop.  Pulmonary:     Effort: Pulmonary effort is normal. No respiratory distress.     Breath sounds: Normal breath sounds. No stridor. No wheezing, rhonchi or rales.   Musculoskeletal:     Right lower leg: No edema.     Left lower leg: No edema.  Neurological:     General: No focal deficit present.     Mental Status: She is alert. Mental status is at baseline.  Psychiatric:        Mood and Affect: Mood normal.        Behavior: Behavior normal.        Thought Content: Thought content normal.           Assessment & Plan:  Hypothyroidism (acquired) - Plan: TSH  Elevated liver function tests - Plan: COMPLETE METABOLIC PANEL WITH GFR  Stage 3 chronic kidney disease, unspecified whether stage 3a or 3b CKD (HCC)  Diabetes mellitus without complication (HCC) First regarding her chronic pain.  I recommended that she limit herself to tramadol 100 mg every 8 hours.  I will not increase the dose.  Stay away from Tylenol.  Recheck a CMP today to monitor liver function test.  If persistently elevated I will hold her statin schedule a right upper quadrant ultrasound.  Her blood sugars are well-controlled.  I do not believe the Januvia would achieve this adequate control when his sugars were previously in the 300s.  Therefore I recommended stopping Januvia but due to her elevated protein creatinine ratio I would replace this with Jardiance 10 mg a day.  In August her TSH was elevated.  I would like to repeat that today to ensure that she is taking the appropriate dose of levothyroxine.  Her weight remains stable.

## 2023-10-07 ENCOUNTER — Encounter: Payer: Self-pay | Admitting: Family Medicine

## 2023-10-07 ENCOUNTER — Other Ambulatory Visit: Payer: Self-pay | Admitting: Family Medicine

## 2023-10-07 ENCOUNTER — Other Ambulatory Visit: Payer: Self-pay

## 2023-10-07 DIAGNOSIS — E119 Type 2 diabetes mellitus without complications: Secondary | ICD-10-CM

## 2023-10-07 DIAGNOSIS — N183 Chronic kidney disease, stage 3 unspecified: Secondary | ICD-10-CM

## 2023-10-07 LAB — COMPLETE METABOLIC PANEL WITH GFR
AG Ratio: 1.9 (calc) (ref 1.0–2.5)
ALT: 55 U/L — ABNORMAL HIGH (ref 6–29)
AST: 48 U/L — ABNORMAL HIGH (ref 10–35)
Albumin: 4.3 g/dL (ref 3.6–5.1)
Alkaline phosphatase (APISO): 58 U/L (ref 37–153)
BUN/Creatinine Ratio: 23 (calc) — ABNORMAL HIGH (ref 6–22)
BUN: 37 mg/dL — ABNORMAL HIGH (ref 7–25)
CO2: 23 mmol/L (ref 20–32)
Calcium: 8.6 mg/dL (ref 8.6–10.4)
Chloride: 107 mmol/L (ref 98–110)
Creat: 1.61 mg/dL — ABNORMAL HIGH (ref 0.50–1.05)
Globulin: 2.3 g/dL (ref 1.9–3.7)
Glucose, Bld: 82 mg/dL (ref 65–99)
Potassium: 4 mmol/L (ref 3.5–5.3)
Sodium: 140 mmol/L (ref 135–146)
Total Bilirubin: 0.8 mg/dL (ref 0.2–1.2)
Total Protein: 6.6 g/dL (ref 6.1–8.1)
eGFR: 35 mL/min/{1.73_m2} — ABNORMAL LOW (ref >=60)

## 2023-10-07 LAB — TSH: TSH: 8.65 m[IU]/L — ABNORMAL HIGH (ref 0.40–4.50)

## 2023-10-07 MED ORDER — AMPHETAMINE-DEXTROAMPHET ER 20 MG PO CP24: 20.0000 mg | ORAL_CAPSULE | ORAL | 0 refills | Status: DC

## 2023-10-09 ENCOUNTER — Other Ambulatory Visit: Payer: Self-pay

## 2023-10-09 ENCOUNTER — Telehealth: Payer: Self-pay | Admitting: Family Medicine

## 2023-10-09 NOTE — Telephone Encounter (Signed)
 Copied from CRM [id number]. Topic: General - Other >> Oct 09, 2023  8:14 AM Nestora J wrote: Reason for CRM: Pt is requesting a call back from Dr. Clara Nurse, to go over a conversation that was had in My chart messaging about the pt's labs and medications. Pt callback [phone]

## 2023-10-10 ENCOUNTER — Other Ambulatory Visit: Payer: Self-pay

## 2023-10-10 ENCOUNTER — Other Ambulatory Visit: Payer: Medicare PPO

## 2023-10-10 ENCOUNTER — Telehealth: Payer: Self-pay

## 2023-10-10 DIAGNOSIS — E119 Type 2 diabetes mellitus without complications: Secondary | ICD-10-CM

## 2023-10-10 DIAGNOSIS — N183 Chronic kidney disease, stage 3 unspecified: Secondary | ICD-10-CM

## 2023-10-10 DIAGNOSIS — E039 Hypothyroidism, unspecified: Secondary | ICD-10-CM

## 2023-10-10 LAB — URINALYSIS, ROUTINE W REFLEX MICROSCOPIC
Bilirubin Urine: NEGATIVE
Hgb urine dipstick: NEGATIVE
Hyaline Cast: NONE SEEN /[LPF]
Ketones, ur: NEGATIVE
Nitrite: NEGATIVE
Protein, ur: NEGATIVE
RBC / HPF: NONE SEEN /[HPF] (ref 0–2)
Specific Gravity, Urine: 1.007 (ref 1.001–1.035)
pH: 5.5 (ref 5.0–8.0)

## 2023-10-10 LAB — MICROSCOPIC MESSAGE

## 2023-10-10 MED ORDER — LEVOTHYROXINE SODIUM 50 MCG PO TABS: 50.0000 ug | ORAL_TABLET | Freq: Every day | ORAL | 3 refills | Status: DC

## 2023-10-10 NOTE — Telephone Encounter (Signed)
 Good morning ma'am,  The patient asks:  In the PAST when I've had an ULTRASOUND I went to [facility] on [address]   Can U/S be scheduled at [facility]?   Thank you!  Tracy Price

## 2023-10-13 ENCOUNTER — Ambulatory Visit (HOSPITAL_COMMUNITY): Payer: Medicare PPO

## 2023-10-15 ENCOUNTER — Encounter: Payer: Medicare PPO | Admitting: Gastroenterology

## 2023-10-15 ENCOUNTER — Ambulatory Visit (HOSPITAL_BASED_OUTPATIENT_CLINIC_OR_DEPARTMENT_OTHER): Payer: Medicare PPO

## 2023-10-20 ENCOUNTER — Other Ambulatory Visit (HOSPITAL_COMMUNITY): Payer: Medicare PPO

## 2023-10-27 ENCOUNTER — Other Ambulatory Visit: Payer: Medicare PPO

## 2023-10-27 DIAGNOSIS — I10 Essential (primary) hypertension: Secondary | ICD-10-CM | POA: Diagnosis not present

## 2023-10-27 DIAGNOSIS — E119 Type 2 diabetes mellitus without complications: Secondary | ICD-10-CM | POA: Diagnosis not present

## 2023-10-27 DIAGNOSIS — N183 Chronic kidney disease, stage 3 unspecified: Secondary | ICD-10-CM

## 2023-10-28 ENCOUNTER — Telehealth: Payer: Self-pay

## 2023-10-28 ENCOUNTER — Other Ambulatory Visit: Payer: Self-pay | Admitting: Family Medicine

## 2023-10-28 ENCOUNTER — Other Ambulatory Visit: Payer: Self-pay

## 2023-10-28 DIAGNOSIS — N183 Chronic kidney disease, stage 3 unspecified: Secondary | ICD-10-CM

## 2023-10-28 LAB — BASIC METABOLIC PANEL
BUN/Creatinine Ratio: 21 (calc) (ref 6–22)
BUN: 42 mg/dL — ABNORMAL HIGH (ref 7–25)
CO2: 32 mmol/L (ref 20–32)
Calcium: 9 mg/dL (ref 8.6–10.4)
Chloride: 95 mmol/L — ABNORMAL LOW (ref 98–110)
Creat: 1.98 mg/dL — ABNORMAL HIGH (ref 0.50–1.05)
Glucose, Bld: 141 mg/dL — ABNORMAL HIGH (ref 65–99)
Potassium: 3.6 mmol/L (ref 3.5–5.3)
Sodium: 139 mmol/L (ref 135–146)

## 2023-10-28 MED ORDER — ALPRAZOLAM 0.5 MG PO TABS: 0.5000 mg | ORAL_TABLET | Freq: Every evening | ORAL | 3 refills | Status: DC | PRN

## 2023-10-28 NOTE — Telephone Encounter (Signed)
Copied from CRM [id number]. Topic: Clinical - Prescription Issue >> Oct 28, 2023  3:50 PM Gildardo Pounds wrote: Reason for CRM: Dorathy Daft with [facility] Benefits Dept asked that a prior authorization be sent for ALPRAZolam Prudy Feeler) 0.5 MG tablet. Callback number is [phone]

## 2023-11-02 ENCOUNTER — Encounter: Payer: Self-pay | Admitting: Family Medicine

## 2023-11-03 ENCOUNTER — Other Ambulatory Visit: Payer: Self-pay

## 2023-11-03 ENCOUNTER — Encounter: Payer: Self-pay | Admitting: Family Medicine

## 2023-11-03 ENCOUNTER — Other Ambulatory Visit: Payer: Self-pay | Admitting: Family Medicine

## 2023-11-03 DIAGNOSIS — N183 Chronic kidney disease, stage 3 unspecified: Secondary | ICD-10-CM

## 2023-11-03 DIAGNOSIS — E119 Type 2 diabetes mellitus without complications: Secondary | ICD-10-CM

## 2023-11-03 MED ORDER — AMPHETAMINE-DEXTROAMPHET ER 20 MG PO CP24: 20.0000 mg | ORAL_CAPSULE | ORAL | 0 refills | Status: DC

## 2023-11-04 DIAGNOSIS — E109 Type 1 diabetes mellitus without complications: Secondary | ICD-10-CM | POA: Diagnosis not present

## 2023-11-05 ENCOUNTER — Telehealth: Payer: Self-pay

## 2023-11-05 NOTE — Telephone Encounter (Signed)
Pt wanted to advised you that she restarted her Losartan because her BP was 150/100, checked by RN at work. Referral has been placed for Nephrology and she is awaiting a call back. Thanks.

## 2023-11-05 NOTE — Telephone Encounter (Signed)
Re Ref# [id number] pls send it to Could you send my kindey referral to  [facility] [address] [place]  [phone]   Dr Crista Elliot   Per pt request

## 2023-11-10 ENCOUNTER — Encounter: Payer: Self-pay | Admitting: Family Medicine

## 2023-11-11 ENCOUNTER — Other Ambulatory Visit: Payer: Self-pay | Admitting: Family Medicine

## 2023-11-11 DIAGNOSIS — G8929 Other chronic pain: Secondary | ICD-10-CM

## 2023-11-11 MED ORDER — TRAMADOL HCL 50 MG PO TABS: 100.0000 mg | ORAL_TABLET | Freq: Three times a day (TID) | ORAL | 0 refills | Status: DC | PRN

## 2023-11-13 ENCOUNTER — Ambulatory Visit: Payer: Medicare PPO | Admitting: Family Medicine

## 2023-11-28 ENCOUNTER — Encounter: Payer: Self-pay | Admitting: Family Medicine

## 2023-12-02 ENCOUNTER — Other Ambulatory Visit: Payer: Self-pay | Admitting: Family Medicine

## 2023-12-02 MED ORDER — AMPHETAMINE-DEXTROAMPHET ER 20 MG PO CP24: 20.0000 mg | ORAL_CAPSULE | ORAL | 0 refills | Status: DC

## 2023-12-05 ENCOUNTER — Other Ambulatory Visit: Payer: Self-pay | Admitting: Nephrology

## 2023-12-05 DIAGNOSIS — I129 Hypertensive chronic kidney disease with stage 1 through stage 4 chronic kidney disease, or unspecified chronic kidney disease: Secondary | ICD-10-CM | POA: Diagnosis not present

## 2023-12-05 DIAGNOSIS — N1832 Chronic kidney disease, stage 3b: Secondary | ICD-10-CM

## 2023-12-05 DIAGNOSIS — N189 Chronic kidney disease, unspecified: Secondary | ICD-10-CM | POA: Diagnosis not present

## 2023-12-05 DIAGNOSIS — D631 Anemia in chronic kidney disease: Secondary | ICD-10-CM | POA: Diagnosis not present

## 2023-12-05 DIAGNOSIS — E1122 Type 2 diabetes mellitus with diabetic chronic kidney disease: Secondary | ICD-10-CM | POA: Diagnosis not present

## 2023-12-05 DIAGNOSIS — N390 Urinary tract infection, site not specified: Secondary | ICD-10-CM | POA: Diagnosis not present

## 2023-12-05 LAB — LAB REPORT - SCANNED
Calcium: 9.4
EGFR: 49

## 2023-12-07 ENCOUNTER — Encounter: Payer: Self-pay | Admitting: Family Medicine

## 2023-12-08 ENCOUNTER — Other Ambulatory Visit: Payer: Self-pay | Admitting: Family Medicine

## 2023-12-08 ENCOUNTER — Other Ambulatory Visit: Payer: Medicare PPO

## 2023-12-08 DIAGNOSIS — G8929 Other chronic pain: Secondary | ICD-10-CM

## 2023-12-09 ENCOUNTER — Ambulatory Visit
Admission: RE | Admit: 2023-12-09 | Discharge: 2023-12-09 | Disposition: A | Discharge status: Home / Self Care | Payer: Medicare PPO | Source: Ambulatory Visit | Attending: Nephrology | Admitting: Nephrology

## 2023-12-09 DIAGNOSIS — N189 Chronic kidney disease, unspecified: Secondary | ICD-10-CM | POA: Diagnosis not present

## 2023-12-09 DIAGNOSIS — N1832 Chronic kidney disease, stage 3b: Secondary | ICD-10-CM

## 2023-12-11 ENCOUNTER — Other Ambulatory Visit: Payer: Medicare PPO

## 2023-12-15 LAB — LAB REPORT - SCANNED
A1c: 5.2
Creatinine, POC: 31.1 mg/dL
EGFR: 49
HM HIV Screening: NEGATIVE
HM Hepatitis Screen: NEGATIVE

## 2023-12-16 ENCOUNTER — Other Ambulatory Visit

## 2023-12-16 ENCOUNTER — Other Ambulatory Visit: Payer: Self-pay | Admitting: Family Medicine

## 2023-12-16 ENCOUNTER — Other Ambulatory Visit: Payer: Self-pay

## 2023-12-16 DIAGNOSIS — R300 Dysuria: Secondary | ICD-10-CM | POA: Diagnosis not present

## 2023-12-16 MED ORDER — CEPHALEXIN 500 MG PO CAPS: 500.0000 mg | ORAL_CAPSULE | Freq: Three times a day (TID) | ORAL | 0 refills | Status: DC

## 2023-12-17 LAB — URINALYSIS, ROUTINE W REFLEX MICROSCOPIC
Bilirubin Urine: NEGATIVE
Glucose, UA: NEGATIVE
Hgb urine dipstick: NEGATIVE
Ketones, ur: NEGATIVE
Nitrite: NEGATIVE
Protein, ur: NEGATIVE
RBC / HPF: NONE SEEN /HPF (ref 0–2)
Specific Gravity, Urine: 1.012 (ref 1.001–1.035)
Squamous Epithelial / HPF: NONE SEEN /HPF (ref <=5)
pH: 5.5 (ref 5.0–8.0)

## 2023-12-17 LAB — MICROSCOPIC MESSAGE

## 2023-12-18 ENCOUNTER — Other Ambulatory Visit: Payer: Self-pay | Admitting: Family Medicine

## 2023-12-18 ENCOUNTER — Ambulatory Visit: Payer: Medicare PPO | Admitting: Family Medicine

## 2023-12-18 ENCOUNTER — Encounter: Payer: Self-pay | Admitting: Family Medicine

## 2023-12-18 VITALS — BP 120/76 | HR 72 | Ht 59.0 in | Wt 113.8 lb

## 2023-12-18 DIAGNOSIS — N183 Chronic kidney disease, stage 3 unspecified: Secondary | ICD-10-CM | POA: Diagnosis not present

## 2023-12-18 DIAGNOSIS — E039 Hypothyroidism, unspecified: Secondary | ICD-10-CM | POA: Diagnosis not present

## 2023-12-18 DIAGNOSIS — Z1211 Encounter for screening for malignant neoplasm of colon: Secondary | ICD-10-CM

## 2023-12-18 LAB — URINE CULTURE
MICRO NUMBER:: 16186494
SPECIMEN QUALITY:: ADEQUATE

## 2023-12-18 LAB — TSH: TSH: 6.47 m[IU]/L — ABNORMAL HIGH (ref 0.40–4.50)

## 2023-12-18 MED ORDER — CEPHALEXIN 500 MG PO CAPS: 500.0000 mg | ORAL_CAPSULE | Freq: Three times a day (TID) | ORAL | 0 refills | Status: DC

## 2023-12-18 NOTE — Progress Notes (Signed)
 Subjective:    Patient ID: Tracy Price, female    DOB: August 06, 1956, 68 y.o.   MRN: [id number] Patient is here today for a follow-up of her diabetes and her hypothyroidism.  Since her last visit, due to a decline in her GFR to stage IV chronic kidney disease I referred her to nephrology.  She has an appointment to see the nephrologist in April.  She has tried and failed [place].  She is currently on losartan.  Reviewing her medication list she is on Fosamax as well as hydrochlorothiazide.  Her blood pressure today is excellent.  I would like her to avoid diuretics if possible.  I also do not want her taking Fosamax due to her chronic kidney disease.  Her most recent hemoglobin A1c was 5.2.  Was checked last week.  She is concerned because her phosphorus is high.  I do not have a lab to review.  This is based on her report of her nephrologist blood work.  She is due for a colonoscopy.  She would also like to see a dietitian due to her chronic kidney disease Past Medical History:  Diagnosis Date   ADD (attention deficit disorder)    Allergy    Anemia    Chest pain    CKD (chronic kidney disease) stage 3, GFR 30-59 ml/min (HCC)    Diabetes mellitus without complication (HCC)    type 2   GERD (gastroesophageal reflux disease)    Hyperlipidemia    Hypertension    Hypothyroidism    Osteoporosis    Sleep apnea 1995   had surgery to correct   Subdural hematoma [facility])     Past Surgical History:  Procedure Laterality Date   COLONOSCOPY     CRANIOTOMY Left 03/05/2017   Procedure: LEFT BURR HOLE  HEMATOMA EVACUATION SUBDURAL;  Surgeon: Donalee Citrin, MD;  [facility];  Service: Neurosurgery;  Laterality: Left;   GASTRIC BYPASS  2005   orif arm     TONSILLECTOMY     Current Outpatient Medications on File Prior to Visit  Medication Sig Dispense Refill   ALPRAZolam (XANAX) 0.5 MG tablet Take 1 tablet (0.5 mg total) by mouth at bedtime as needed for anxiety (90 day supply). 90  tablet 3   amphetamine-dextroamphetamine (ADDERALL XR) 20 MG 24 hr capsule Take 1 capsule (20 mg total) by mouth every morning. 30 capsule 0   blood glucose meter kit and supplies Dispense based on patient and insurance preference. Use up to four times daily as directed. (FOR ICD-10 E10.9, E11.9). 1 each 0   empagliflozin (JARDIANCE) 10 MG TABS tablet Take 10 mg by mouth daily.     ferrous sulfate 325 (65 FE) MG tablet      glucose blood (ACCU-CHEK GUIDE) test strip USE TO CHECK BLOOD SUGARS 5 TIMES PER DAY. 200 each 6   levothyroxine (SYNTHROID) 50 MCG tablet Take 1 tablet (50 mcg total) by mouth daily. 90 tablet 3   losartan (COZAAR) 100 MG tablet Take 1 tablet (100 mg total) by mouth daily. 90 tablet 3   Magnesium 500 MG CAPS      Melatonin 5 MG TABS Take by mouth.     OVER THE COUNTER MEDICATION Vitamin D 250 mg     pantoprazole (PROTONIX) 40 MG tablet TAKE 1 TABLET BY MOUTH TWICE A DAY BEFORE A MEAL 180 tablet 3   rosuvastatin (CRESTOR) 10 MG tablet Take 1 tablet (10 mg total) by mouth daily. 90  tablet 0   traMADol (ULTRAM) 50 MG tablet Take 2 tablets (100 mg total) by mouth 3 (three) times daily as needed. 180 tablet 0   No current facility-administered medications on file prior to visit.   No Known Allergies  Social History   Socioeconomic History   Marital status: Single    Spouse name: Not on file   Number of children: Not on file   Years of education: Not on file   Highest education level: Bachelor's degree (e.g., BA, AB, BS)  Occupational History   Not on file  Tobacco Use   Smoking status: Never   Smokeless tobacco: Never  Vaping Use   Vaping status: Never Used  Substance and Sexual Activity   Alcohol use: No   Drug use: No   Sexual activity: Not Currently  Other Topics Concern   Not on file  Social History Narrative   Not on file   Social Drivers of Health   Financial Resource Strain: Medium Risk (09/30/2023)   Overall Financial Resource Strain (CARDIA)     Difficulty of Paying Living Expenses: Somewhat hard  Food Insecurity: Food Insecurity Present (09/30/2023)   Hunger Vital Sign    Worried About Running Out of Food in the Last Year: Sometimes true    Ran Out of Food in the Last Year: Sometimes true  Transportation Needs: Unmet Transportation Needs (09/30/2023)   PRAPARE - Transportation    Lack of Transportation (Medical): Yes    Lack of Transportation (Non-Medical): Yes  Physical Activity: Insufficiently Active (09/30/2023)   Exercise Vital Sign    Days of Exercise per Week: 2 days    Minutes of Exercise per Session: 30 min  Stress: No Stress Concern Present (09/30/2023)   [employer] of Occupational Health - Occupational Stress Questionnaire    Feeling of Stress : Only a little  Social Connections: Moderately Integrated (09/30/2023)   Social Connection and Isolation Panel [NHANES]    Frequency of Communication with Friends and Family: More than three times a week    Frequency of Social Gatherings with Friends and Family: Twice a week    Attends Religious Services: 1 to 4 times per year    Active Member of [employer] or Organizations: Yes    Attends Banker Meetings: More than 4 times per year    Marital Status: Never married  Intimate Partner Violence: Not At Risk (04/17/2023)   Humiliation, Afraid, Rape, and Kick questionnaire    Fear of Current or Ex-Partner: No    Emotionally Abused: No    Physically Abused: No    Sexually Abused: No      Review of Systems  Skin:  Positive for rash.       Objective:   Physical Exam Vitals reviewed.  Constitutional:      Appearance: Normal appearance. She is cachectic. She is not ill-appearing, toxic-appearing or diaphoretic.  Cardiovascular:     Rate and Rhythm: Normal rate and regular rhythm.     Heart sounds: Normal heart sounds. No murmur heard.    No gallop.  Pulmonary:     Effort: Pulmonary effort is normal. No respiratory distress.     Breath sounds: Normal  breath sounds. No stridor. No wheezing, rhonchi or rales.  Musculoskeletal:     Right lower leg: No edema.     Left lower leg: No edema.  Neurological:     General: No focal deficit present.     Mental Status: She is alert. Mental status is at  baseline.  Psychiatric:        Mood and Affect: Mood normal.        Behavior: Behavior normal.        Thought Content: Thought content normal.           Assessment & Plan:  Screen for colon cancer - Plan: Amb Referral to Colonoscopy  Hypothyroidism, unspecified type - Plan: TSH  Colon cancer screening - Plan: Ambulatory referral to Gastroenterology  Stage 3 chronic kidney disease, unspecified whether stage 3a or 3b CKD (HCC) I will check her TSH to ensure that her hypothyroidism is adequately treated.  Her most recent A1c is outstanding so I will ask the patient to stop Januvia.  5.2 is sufficient.  I want her to monitor her blood sugars.  Goal fasting blood sugars are less than 130 and goal 2-hour postprandial sugars are less than 80.  I will ask patient to continue to manage your chronic eating disease.  Based on her report of an elevated phosphorus, she likely would benefit from calcitriol and possibly a phosphate binder.  However I will defer this to her nephrologist who she is meeting with April 2.  I did place a referral for GI and I will also refer her to a dietitian

## 2023-12-19 ENCOUNTER — Other Ambulatory Visit: Payer: Self-pay

## 2023-12-19 ENCOUNTER — Encounter: Payer: Self-pay | Admitting: Family Medicine

## 2023-12-19 DIAGNOSIS — K219 Gastro-esophageal reflux disease without esophagitis: Secondary | ICD-10-CM

## 2023-12-19 DIAGNOSIS — E119 Type 2 diabetes mellitus without complications: Secondary | ICD-10-CM

## 2023-12-19 DIAGNOSIS — I10 Essential (primary) hypertension: Secondary | ICD-10-CM

## 2023-12-19 MED ORDER — LEVOTHYROXINE SODIUM 75 MCG PO TABS: 75.0000 ug | ORAL_TABLET | Freq: Every day | ORAL | 3 refills | Status: DC

## 2023-12-19 MED ORDER — ALPRAZOLAM 0.5 MG PO TABS: 0.5000 mg | ORAL_TABLET | Freq: Every evening | ORAL | 3 refills | Status: DC | PRN

## 2023-12-19 MED ORDER — PANTOPRAZOLE SODIUM 40 MG PO TBEC: DELAYED_RELEASE_TABLET | ORAL | 0 refills | Status: DC

## 2023-12-19 MED ORDER — AMPHETAMINE-DEXTROAMPHET ER 20 MG PO CP24: 20.0000 mg | ORAL_CAPSULE | ORAL | 0 refills | Status: DC

## 2023-12-19 MED ORDER — GLUCOSE BLOOD VI STRP: ORAL_STRIP | 2 refills | Status: DC

## 2023-12-19 MED ORDER — ACCU-CHEK GUIDE TEST VI STRP: ORAL_STRIP | 12 refills | Status: DC

## 2023-12-19 MED ORDER — LOSARTAN POTASSIUM 100 MG PO TABS: 100.0000 mg | ORAL_TABLET | Freq: Every day | ORAL | 0 refills | Status: DC

## 2023-12-22 ENCOUNTER — Other Ambulatory Visit: Payer: Self-pay

## 2023-12-22 ENCOUNTER — Telehealth: Payer: Self-pay

## 2023-12-22 DIAGNOSIS — E785 Hyperlipidemia, unspecified: Secondary | ICD-10-CM

## 2023-12-22 MED ORDER — ROSUVASTATIN CALCIUM 10 MG PO TABS
10.0000 mg | ORAL_TABLET | Freq: Every day | ORAL | 0 refills | Status: DC
Start: 2023-12-22 — End: 2024-06-04

## 2023-12-22 NOTE — Telephone Encounter (Signed)
 Copied from CRM [id number]. Topic: Clinical - Prescription Issue >> Dec 22, 2023 12:19 PM Martha Clan wrote: Reason for CRM: rosuvastatin (CRESTOR) 10 MG tablet [[id number]] Patient states she discussed getting her medication refilled but this medication was not included to get to the mail order Rx service.

## 2023-12-24 ENCOUNTER — Other Ambulatory Visit: Payer: Self-pay

## 2023-12-24 DIAGNOSIS — G8929 Other chronic pain: Secondary | ICD-10-CM

## 2023-12-24 DIAGNOSIS — N1832 Chronic kidney disease, stage 3b: Secondary | ICD-10-CM | POA: Diagnosis not present

## 2023-12-24 DIAGNOSIS — K219 Gastro-esophageal reflux disease without esophagitis: Secondary | ICD-10-CM

## 2023-12-24 DIAGNOSIS — I10 Essential (primary) hypertension: Secondary | ICD-10-CM

## 2023-12-24 DIAGNOSIS — E119 Type 2 diabetes mellitus without complications: Secondary | ICD-10-CM

## 2023-12-24 DIAGNOSIS — N390 Urinary tract infection, site not specified: Secondary | ICD-10-CM | POA: Diagnosis not present

## 2023-12-24 MED ORDER — PANTOPRAZOLE SODIUM 40 MG PO TBEC: DELAYED_RELEASE_TABLET | ORAL | 0 refills | Status: DC

## 2023-12-24 MED ORDER — LEVOTHYROXINE SODIUM 75 MCG PO TABS: 75.0000 ug | ORAL_TABLET | Freq: Every day | ORAL | 3 refills | Status: DC

## 2023-12-24 MED ORDER — GLUCOSE BLOOD VI STRP: ORAL_STRIP | 2 refills | Status: AC

## 2023-12-24 MED ORDER — LOSARTAN POTASSIUM 100 MG PO TABS: 100.0000 mg | ORAL_TABLET | Freq: Every day | ORAL | 0 refills | Status: DC

## 2023-12-25 ENCOUNTER — Other Ambulatory Visit: Payer: Self-pay | Admitting: Family Medicine

## 2023-12-25 ENCOUNTER — Telehealth: Payer: Self-pay

## 2023-12-25 LAB — LAB REPORT - SCANNED
Calcium: 8.3
EGFR: 51

## 2023-12-25 MED ORDER — AMPHETAMINE-DEXTROAMPHET ER 20 MG PO CP24: 20.0000 mg | ORAL_CAPSULE | ORAL | 0 refills | Status: DC

## 2023-12-25 MED ORDER — ALPRAZOLAM 0.5 MG PO TABS: 0.5000 mg | ORAL_TABLET | Freq: Every evening | ORAL | 3 refills | Status: DC | PRN

## 2023-12-25 NOTE — Telephone Encounter (Signed)
 Pt asks if the Adderall can be sent to [facility] on [address], because the [facility] does not have it. Thank you.  Copied from CRM [id number]. Topic: Clinical - Prescription Issue >> Dec 22, 2023 12:19 PM Martha Clan wrote: Reason for CRM: rosuvastatin (CRESTOR) 10 MG tablet [[id number]] Patient states she discussed getting her medication refilled but this medication was not included to get to the mail order Rx service. >> Dec 25, 2023  9:40 AM Marlow Baars wrote: The patient called back in stating the [facility] simply does not have the amphetamine-dextroamphetamine (ADDERALL XR) 20 MG 24 hr capsule in stock. She says [facility] has a limited stock but she is hoping her refill request can be sent there as soon as possible please assist further

## 2023-12-29 ENCOUNTER — Telehealth: Payer: Self-pay | Admitting: Family Medicine

## 2024-01-13 ENCOUNTER — Other Ambulatory Visit: Payer: Self-pay

## 2024-01-13 ENCOUNTER — Telehealth: Payer: Self-pay

## 2024-01-13 DIAGNOSIS — E039 Hypothyroidism, unspecified: Secondary | ICD-10-CM

## 2024-01-13 NOTE — Telephone Encounter (Signed)
 Copied from CRM [id number]. Topic: Clinical - Request for Lab/Test Order >> Jan 13, 2024  7:47 AM Clayton Bibles wrote: Reason for CRM:  Flo is calling to see if Dr. Tanya Nones wants to go ahead and order labs when she goes next week (Wednesday 01/21/24 at 11:15) to [facility] on [address]. Titiana's kidney doctor has order labs for her to complete.  Please send her a message through MyChart. Thanks

## 2024-01-14 ENCOUNTER — Other Ambulatory Visit: Payer: Self-pay | Admitting: Family Medicine

## 2024-01-14 DIAGNOSIS — M25511 Pain in right shoulder: Secondary | ICD-10-CM

## 2024-01-15 ENCOUNTER — Encounter: Payer: Self-pay | Admitting: Gastroenterology

## 2024-01-15 NOTE — Telephone Encounter (Signed)
 Requested medications are due for refill today.  yes  Requested medications are on the active medications list.  yes  Last refill. 12/08/2023 #180 0 rf  Future visit scheduled.   yes  Notes to clinic.  Refill not delegated.    Requested Prescriptions  Pending Prescriptions Disp Refills   traMADol (ULTRAM) 50 MG tablet [Pharmacy Med Name: TRAMADOL 50MG  TABLETS] 180 tablet     Sig: TAKE 2 TABLETS(100 MG) BY MOUTH THREE TIMES DAILY AS NEEDED     Not Delegated - Analgesics:  Opioid Agonists Failed - 01/15/2024 11:45 AM      Failed - This refill cannot be delegated      Failed - Urine Drug Screen completed in last 360 days      Failed - Valid encounter within last 3 months    Recent Outpatient Visits           4 weeks ago Screen for colon cancer   [place] [facility] Donita Brooks, MD   3 months ago Hypothyroidism (acquired)   [place] [facility] Donita Brooks, MD   7 months ago Unintentional weight loss   [place] [facility] Donita Brooks, MD   8 months ago Unintentional weight loss   [place] [facility] Tanya Nones, Priscille Heidelberg, MD   8 months ago Unintentional weight loss   [place] [facility] Pickard, Priscille Heidelberg, MD

## 2024-01-27 NOTE — Telephone Encounter (Signed)
 Erroneous encounter. Please disregard.

## 2024-01-29 ENCOUNTER — Other Ambulatory Visit: Payer: Self-pay

## 2024-01-29 DIAGNOSIS — E119 Type 2 diabetes mellitus without complications: Secondary | ICD-10-CM

## 2024-01-29 DIAGNOSIS — E039 Hypothyroidism, unspecified: Secondary | ICD-10-CM

## 2024-01-29 DIAGNOSIS — N1832 Chronic kidney disease, stage 3b: Secondary | ICD-10-CM | POA: Diagnosis not present

## 2024-01-29 NOTE — Addendum Note (Signed)
 Addended by: Vallerie Gave on: 01/29/2024 11:41 AM   Modules accepted: Orders

## 2024-01-30 LAB — LAB REPORT - SCANNED: EGFR: 43

## 2024-02-02 ENCOUNTER — Encounter: Payer: Self-pay | Admitting: Family Medicine

## 2024-02-02 ENCOUNTER — Other Ambulatory Visit: Payer: Self-pay

## 2024-02-02 DIAGNOSIS — E119 Type 2 diabetes mellitus without complications: Secondary | ICD-10-CM

## 2024-02-02 DIAGNOSIS — E109 Type 1 diabetes mellitus without complications: Secondary | ICD-10-CM | POA: Diagnosis not present

## 2024-02-02 MED ORDER — BLOOD GLUCOSE METER KIT: PACK | 0 refills | Status: DC

## 2024-02-02 MED ORDER — BLOOD GLUCOSE METER KIT: PACK | 0 refills | Status: AC

## 2024-02-03 ENCOUNTER — Other Ambulatory Visit: Payer: Self-pay | Admitting: Family Medicine

## 2024-02-03 DIAGNOSIS — G8929 Other chronic pain: Secondary | ICD-10-CM

## 2024-02-06 ENCOUNTER — Encounter: Payer: Self-pay | Admitting: Family Medicine

## 2024-02-07 ENCOUNTER — Other Ambulatory Visit: Payer: Self-pay | Admitting: Family Medicine

## 2024-02-07 DIAGNOSIS — G8929 Other chronic pain: Secondary | ICD-10-CM

## 2024-02-09 ENCOUNTER — Other Ambulatory Visit: Payer: Self-pay

## 2024-02-11 ENCOUNTER — Encounter: Payer: Self-pay | Admitting: Family Medicine

## 2024-02-27 ENCOUNTER — Other Ambulatory Visit: Payer: Self-pay | Admitting: Family Medicine

## 2024-02-27 DIAGNOSIS — E119 Type 2 diabetes mellitus without complications: Secondary | ICD-10-CM

## 2024-03-02 NOTE — Telephone Encounter (Signed)
 Too soon for refill. Refilled 02/02/24.  Requested Prescriptions  Pending Prescriptions Disp Refills   Blood Glucose Monitoring Suppl (ACCU-CHEK GUIDE) w/Device KIT [Pharmacy Med Name: ACCU-CHEK GUIDE     KIT]  0    Sig: USE  UP TO 4 TIMES DAILY AS DIRECTED     Endocrinology: Diabetes - Testing Supplies Passed - 03/02/2024 12:08 PM      Passed - Valid encounter within last 12 months    Recent Outpatient Visits           2 months ago Screen for colon cancer   [place] [facility] Austine Lefort, MD   4 months ago Hypothyroidism (acquired)   [place] [facility] Austine Lefort, MD   9 months ago Unintentional weight loss   [place] Cheril Cork, Cisco Crest, MD   10 months ago Unintentional weight loss   [place] [facility] Cheril Cork, Cisco Crest, MD   10 months ago Unintentional weight loss   [place] [address] [facility] Pickard, Cisco Crest, MD       Future Appointments             In 2 weeks [name], MD [facility]

## 2024-03-06 ENCOUNTER — Other Ambulatory Visit: Payer: Self-pay | Admitting: Family Medicine

## 2024-03-07 ENCOUNTER — Encounter: Payer: Self-pay | Admitting: "Endocrinology

## 2024-03-09 ENCOUNTER — Encounter: Payer: Self-pay | Admitting: Family Medicine

## 2024-03-09 ENCOUNTER — Other Ambulatory Visit: Payer: Self-pay | Admitting: Family Medicine

## 2024-03-09 DIAGNOSIS — G8929 Other chronic pain: Secondary | ICD-10-CM

## 2024-03-10 ENCOUNTER — Other Ambulatory Visit: Payer: Self-pay

## 2024-03-10 DIAGNOSIS — R300 Dysuria: Secondary | ICD-10-CM

## 2024-03-11 DIAGNOSIS — N1832 Chronic kidney disease, stage 3b: Secondary | ICD-10-CM | POA: Diagnosis not present

## 2024-03-11 DIAGNOSIS — E039 Hypothyroidism, unspecified: Secondary | ICD-10-CM | POA: Diagnosis not present

## 2024-03-11 DIAGNOSIS — R300 Dysuria: Secondary | ICD-10-CM | POA: Diagnosis not present

## 2024-03-11 DIAGNOSIS — E119 Type 2 diabetes mellitus without complications: Secondary | ICD-10-CM | POA: Diagnosis not present

## 2024-03-15 ENCOUNTER — Encounter: Payer: Self-pay | Admitting: Family Medicine

## 2024-03-16 LAB — LAB REPORT - SCANNED
Albumin, Urine POC: <3
Albumin/Creatinine Ratio, Urine, POC: <19
Creatinine, POC: 16 mg/dL

## 2024-03-17 ENCOUNTER — Other Ambulatory Visit: Payer: Self-pay

## 2024-03-17 ENCOUNTER — Ambulatory Visit: Payer: Self-pay

## 2024-03-17 MED ORDER — NITROFURANTOIN MONOHYD MACRO 100 MG PO CAPS: 100.0000 mg | ORAL_CAPSULE | Freq: Two times a day (BID) | ORAL | 0 refills | Status: DC

## 2024-03-17 NOTE — Telephone Encounter (Signed)
 Pt states she is positive for E. Coli. Pt had a UA and culture ordered 6/4. Pt states the results are back via [facility]. RN unable to see those results. Pt frustrated. RN called the CAL, CAL states at this time all staff are busy or OOO. Pt hung up before this RN could return to the call.    Copied from CRM [id number]. Topic: Clinical - Red Word Triage >> Mar 17, 2024 11:29 AM Tracy Price wrote: Red Word that prompted transfer to Nurse Triage: Patient has e. Coli and states that she has contacted the office about this and no one has gotten back to her. There are no results to support this. Reason for Disposition  Health Information question, no triage required and triager able to answer question  Answer Assessment - Initial Assessment Questions 1. REASON FOR CALL or QUESTION: What is your reason for calling today? or How can I best help you? or What question do you have that I can help answer?     Pt states she tested positive for E. Coli and would like follow-up from the office  Protocols used: Information Only Call - No Triage-A-AH

## 2024-03-18 ENCOUNTER — Ambulatory Visit: Admitting: Family Medicine

## 2024-03-18 ENCOUNTER — Encounter: Payer: Self-pay | Admitting: "Endocrinology

## 2024-03-18 ENCOUNTER — Telehealth: Payer: Self-pay | Admitting: "Endocrinology

## 2024-03-18 ENCOUNTER — Ambulatory Visit: Admitting: "Endocrinology

## 2024-03-18 VITALS — BP 130/80 | HR 85 | Ht 59.0 in | Wt 123.0 lb

## 2024-03-18 DIAGNOSIS — E1140 Type 2 diabetes mellitus with diabetic neuropathy, unspecified: Secondary | ICD-10-CM | POA: Diagnosis not present

## 2024-03-18 DIAGNOSIS — Z7984 Long term (current) use of oral hypoglycemic drugs: Secondary | ICD-10-CM | POA: Diagnosis not present

## 2024-03-18 DIAGNOSIS — E7800 Pure hypercholesterolemia, unspecified: Secondary | ICD-10-CM

## 2024-03-18 LAB — POCT GLYCOSYLATED HEMOGLOBIN (HGB A1C): Hemoglobin A1C: 5.3 % (ref 4.0–5.6)

## 2024-03-18 MED ORDER — GLIPIZIDE 2.5 MG PO TABS: 1.0000 | ORAL_TABLET | Freq: Two times a day (BID) | ORAL | 3 refills | Status: DC | PRN

## 2024-03-18 MED ORDER — RYBELSUS 3 MG PO TABS: 3.0000 mg | ORAL_TABLET | Freq: Every day | ORAL | 0 refills | Status: DC

## 2024-03-18 NOTE — Progress Notes (Signed)
 Outpatient Endocrinology Note Tracy Newcomer, MD  03/18/24   Tracy Price 1965/06/20 [id number]  Referring Provider: Austine Lefort, MD Primary Care Provider: Austine Lefort, MD Reason for consultation: Subjective   Assessment & Plan  Diagnoses and all orders for this visit:  Type 2 diabetes mellitus with diabetic neuropathy, without long-term current use of insulin  (HCC) -     POCT glycosylated hemoglobin (Hb A1C) -     Semaglutide (RYBELSUS) 3 MG TABS; Take 1 tablet (3 mg total) by mouth daily. -     glipiZIDE  2.5 MG TABS; Take 1 tablet by mouth 2 (two) times daily as needed (for blood sugar more than 200). -     Ambulatory referral to diabetic education  Pure hypercholesterolemia    History of diabetes Type II complicated by +  neuropathy, No results found for: GFR Hba1c goal less than 7, current Hba1c is  Lab Results  Component Value Date   HGBA1C 5.3 03/18/2024   Will recommend the following: Rybelsus 3 mg every day Glipizide  2.5mg  every day bid PRN BG>200  Taken on and off of diabetes medication by PCP, A1C has dipped to 4s and weight loss to 95 lbs on mounjaro  Currently not on anything, self started old januvia  pills after last DM medications stopped by PCP  Worries about heart and stroke and ongoing postprandial hyperglycemia   Was on mounjaro  10 mg/week, was taken off in 09/2023 due to wt loss up to 95 lbs  Has a urine infection every month with E.Coli  Tried jardiance  in past for 1 mo Reports metformin  and Actos did not work   No known contraindications/side effects to any of above medications  -Last LD and Tg are as follows: Lab Results  Component Value Date   LDLCALC 45 09/01/2023    Lab Results  Component Value Date   TRIG 76 09/01/2023   -On rosuvastatin  10 mg QD -Follow low fat diet and exercise   -Blood pressure goal <140/90 - Microalbumin/creatinine goal is < 30 -Last MA/Cr is as follows: Lab Results  Component Value Date    MICROALBUR 0.5 03/29/2022   -on ACE/ARB losartan  100mg  every day  -diet changes including salt restriction -limit eating outside -counseled BP targets per standards of diabetes care -uncontrolled blood pressure can lead to retinopathy, nephropathy and cardiovascular and atherosclerotic heart disease  Reviewed and counseled on: -A1C target -Blood sugar targets -Complications of uncontrolled diabetes  -Checking blood sugar before meals and bedtime and bring log next visit -All medications with mechanism of action and side effects -Hypoglycemia management: rule of 15's, Glucagon Emergency Kit and medical alert ID -low-carb low-fat plate-method diet -At least 20 minutes of physical activity per day -Annual dilated retinal eye exam and foot exam -compliance and follow up needs -follow up as scheduled or earlier if problem gets worse  Call if blood sugar is less than 70 or consistently above 250    Take a 15 gm snack of carbohydrate at bedtime before you go to sleep if your blood sugar is less than 100.    If you are going to fast after midnight for a test or procedure, ask your physician for instructions on how to reduce/decrease your insulin  dose.    Call if blood sugar is less than 70 or consistently above 250  -Treating a low sugar by rule of 15  (15 gms of sugar every 15 min until sugar is more than 70) If you feel your sugar is low,  test your sugar to be sure If your sugar is low (less than 70), then take 15 grams of a fast acting Carbohydrate (3-4 glucose tablets or glucose gel or 4 ounces of juice or regular soda) Recheck your sugar 15 min after treating low to make sure it is more than 70 If sugar is still less than 70, treat again with 15 grams of carbohydrate          Don't drive the hour of hypoglycemia  If unconscious/unable to eat or drink by mouth, use glucagon injection or nasal spray baqsimi and call 911. Can repeat again in 15 min if still unconscious.  Return in  about 4 weeks (around 04/15/2024).  I have reviewed current medications, nurse's notes, allergies, vital signs, past medical and surgical history, family medical history, and social history for this encounter. Counseled patient on symptoms, examination findings, lab findings, imaging results, treatment decisions and monitoring and prognosis. The patient understood the recommendations and agrees with the treatment plan. All questions regarding treatment plan were fully answered.  Tracy Newcomer, MD  03/18/24    History of Present Illness Tracy Price is a 68 y.o. year old female who presents for evaluation of Type II diabetes mellitus.  Tracy Price was first diagnosed around 2010.   Diabetes education -  Home diabetes regimen: none  Was on mounjaro  10 mg/week, was taken off in 09/2023 due to wt loss up to 95 lbs  Has a urine infection every month with E.Coli  Tried jardiance  in past for 1 mo Reports metformin  and Actos did not work   COMPLICATIONS -  MI/Stroke -  retinopathy +  neuropathy - Weight loss - Blurred vision  BLOOD SUGAR DATA  CGM interpretation: At today's visit, we reviewed her CGM downloads. The full report is scanned in the media. Reviewing the CGM trends, BG are elevated postprandially.    Physical Exam  BP 130/80   Pulse 85   Ht 4' 11 (1.499 m)   Wt 123 lb (55.8 kg)   SpO2 98%   BMI 24.84 kg/m    Constitutional: well developed, well nourished Head: normocephalic, atraumatic Eyes: sclera anicteric, no redness Neck: supple Lungs: normal respiratory effort Neurology: alert and oriented Skin: dry, no appreciable rashes Musculoskeletal: no appreciable defects Psychiatric: normal mood and affect Diabetic Foot Exam - Simple   No data filed      Current Medications Patient's Medications  New Prescriptions   GLIPIZIDE  2.5 MG TABS    Take 1 tablet by mouth 2 (two) times daily as needed (for blood sugar more than 200).   SEMAGLUTIDE (RYBELSUS) 3  MG TABS    Take 1 tablet (3 mg total) by mouth daily.  Previous Medications   ALPRAZOLAM  (XANAX ) 0.5 MG TABLET    Take 1 tablet (0.5 mg total) by mouth at bedtime as needed for anxiety (90 day supply).   AMPHETAMINE -DEXTROAMPHETAMINE (ADDERALL XR) 20 MG 24 HR CAPSULE    Take 1 capsule (20 mg total) by mouth every morning.   BLOOD GLUCOSE METER KIT AND SUPPLIES    Dispense based on patient and insurance preference. Use up to four times daily as directed. (FOR ICD-10 E10.9, E11.9). Pt requests: ACCU-CHEK Guide Meter made by [employer]   CEPHALEXIN  (KEFLEX ) 500 MG CAPSULE    Take 1 capsule (500 mg total) by mouth 3 (three) times daily.   FERROUS SULFATE 325 (65 FE) MG TABLET       GLUCOSE BLOOD (ACCU-CHEK GUIDE TEST) TEST STRIP  USE TO CHECK BLOOD SUGARS 5 TIMES PER DAY.   GLUCOSE BLOOD (ACCU-CHEK GUIDE) TEST STRIP    USE TO CHECK BLOOD SUGARS 5 TIMES PER DAY.   LEVOTHYROXINE  (SYNTHROID ) 75 MCG TABLET    Take 1 tablet (75 mcg total) by mouth daily.   LOSARTAN  (COZAAR ) 100 MG TABLET    Take 1 tablet (100 mg total) by mouth daily.   MAGNESIUM 500 MG CAPS       MELATONIN 5 MG TABS    Take by mouth.   NITROFURANTOIN , MACROCRYSTAL-MONOHYDRATE, (MACROBID ) 100 MG CAPSULE    Take 1 capsule (100 mg total) by mouth 2 (two) times daily.   OVER THE COUNTER MEDICATION    Vitamin D 250 mg   PANTOPRAZOLE  (PROTONIX ) 40 MG TABLET    TAKE 1 TABLET BY MOUTH TWICE A DAY BEFORE A MEAL   ROSUVASTATIN  (CRESTOR ) 10 MG TABLET    Take 1 tablet (10 mg total) by mouth daily.   TRAMADOL  (ULTRAM ) 50 MG TABLET    Take 2 tablets (100 mg total) by mouth 3 (three) times daily as needed.  Modified Medications   No medications on file  Discontinued Medications   No medications on file    Allergies No Known Allergies  Past Medical History Past Medical History:  Diagnosis Date   ADD (attention deficit disorder)    Allergy    Anemia    Chest pain    CKD (chronic kidney disease) stage 3, GFR 30-59 ml/min (HCC)    Diabetes  mellitus without complication (HCC)    type 2   GERD (gastroesophageal reflux disease)    Hyperlipidemia    Hypertension    Hypothyroidism    Osteoporosis    Sleep apnea 1995   had surgery to correct   Subdural hematoma [facility])     Past Surgical History Past Surgical History:  Procedure Laterality Date   COLONOSCOPY     CRANIOTOMY Left 03/05/2017   Procedure: LEFT BURR HOLE  HEMATOMA EVACUATION SUBDURAL;  Surgeon: Gearl Keens, MD;  Location: [facility] OR;  Service: Neurosurgery;  Laterality: Left;   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  Social History Social History   Socioeconomic History   Marital status: Single    Spouse name: Not on file   Number of children: Not on file   Years of education: Not on file   Highest education level: Bachelor's degree (e.g., BA, AB, BS)  Occupational History   Not on file  Tobacco Use   Smoking status: Never   Smokeless tobacco: Never  Vaping Use   Vaping status: Never Used  Substance and Sexual Activity   Alcohol use: No   Drug use: No   Sexual activity: Not Currently  Other Topics Concern   Not on file  Social History Narrative   Not on file   Social Drivers of Health   Financial Resource Strain: Medium Risk (09/30/2023)   Overall Financial Resource Strain (CARDIA)    Difficulty of Paying Living Expenses: Somewhat hard  Food Insecurity: Food Insecurity Present (09/30/2023)   Hunger Vital Sign    Worried About Running Out of Food in the Last Year: Sometimes true    Ran Out of Food in the Last Year: Sometimes true  Transportation Needs:  Unmet Transportation Needs (09/30/2023)   PRAPARE - Transportation    Lack of Transportation (Medical): Yes    Lack of Transportation (Non-Medical): Yes  Physical Activity: Insufficiently Active  (09/30/2023)   Exercise Vital Sign    Days of Exercise per Week: 2 days    Minutes of Exercise per Session: 30 min  Stress: No Stress Concern Present (09/30/2023)   [employer] of Occupational Health - Occupational Stress Questionnaire    Feeling of Stress : Only a little  Social Connections: Moderately Integrated (09/30/2023)   Social Connection and Isolation Panel    Frequency of Communication with Friends and Family: More than three times a week    Frequency of Social Gatherings with Friends and Family: Twice a week    Attends Religious Services: 1 to 4 times per year    Active Member of Clubs or Organizations: Yes    Attends Banker Meetings: More than 4 times per year    Marital Status: Never married  Intimate Partner Violence: Not At Risk (04/17/2023)   Humiliation, Afraid, Rape, and Kick questionnaire    Fear of Current or Ex-Partner: No    Emotionally Abused: No    Physically Abused: No    Sexually Abused: No    Lab Results  Component Value Date   HGBA1C 5.3 03/18/2024   HGBA1C 6.2 (H) 09/01/2023   HGBA1C 4.8 04/22/2023   Lab Results  Component Value Date   CHOL 141 09/01/2023   Lab Results  Component Value Date   HDL 80 09/01/2023   Lab Results  Component Value Date   LDLCALC 45 09/01/2023   Lab Results  Component Value Date   TRIG 76 09/01/2023   Lab Results  Component Value Date   CHOLHDL 1.8 09/01/2023   Lab Results  Component Value Date   CREATININE 1.98 (H) 10/27/2023   No results found for: GFR Lab Results  Component Value Date   MICROALBUR 0.5 03/29/2022      Component Value Date/Time   NA 139 10/27/2023 1439   K 3.6 10/27/2023 1439   CL 95 (L) 10/27/2023 1439   CO2 32 10/27/2023 1439   GLUCOSE 141 (H) 10/27/2023 1439   BUN 42 (H) 10/27/2023 1439   CREATININE 1.98 (H) 10/27/2023 1439   CALCIUM  8.3 12/24/2023 0000   PROT 6.6 10/06/2023 1602   ALBUMIN 4.1 03/27/2017 1650   AST 48 (H) 10/06/2023 1602   ALT 55 (H)  10/06/2023 1602   ALKPHOS 134 (H) 03/27/2017 1650   BILITOT 0.8 10/06/2023 1602   GFRNONAA 42 (L) 04/12/2021 1106   GFRAA 48 (L) 04/12/2021 1106      Latest Ref Rng & Units 12/24/2023   12:00 AM 12/05/2023   12:00 AM 10/27/2023    2:39 PM  BMP  Glucose 65 - 99 mg/dL   161   BUN 7 - 25 mg/dL   42   Creatinine 0.96 - 1.05 mg/dL   0.45   BUN/Creat Ratio 6 - 22 (calc)   21   Sodium 135 - 146 mmol/L   139   Potassium 3.5 - 5.3 mmol/L   3.6   Chloride 98 - 110 mmol/L   95   CO2 20 - 32 mmol/L   32   Calcium   8.3     9.4     9.0      This result is from an external source.       Component Value Date/Time   WBC 7.6 09/01/2023 1041  RBC 3.61 (L) 09/01/2023 1041   HGB 11.8 09/01/2023 1041   HCT 35.6 09/01/2023 1041   PLT 174 09/01/2023 1041   MCV 98.6 09/01/2023 1041   MCH 32.7 09/01/2023 1041   MCHC 33.1 09/01/2023 1041   RDW 12.1 09/01/2023 1041   LYMPHSABS 1,540 05/29/2023 1034   MONOABS 540 03/27/2017 1650   EOSABS 578 (H) 09/01/2023 1041   BASOSABS 68 09/01/2023 1041     Parts of this note may have been dictated using voice recognition software. There may be variances in spelling and vocabulary which are unintentional. Not all errors are proofread. Please notify the [facility] if any discrepancies are noted or if the meaning of any statement is not clear.

## 2024-03-18 NOTE — Telephone Encounter (Signed)
 Patient wants to know if she can discontinue levothyroxine  (SYNTHROID ) 75 MCG tablet.  Please call patient with answer

## 2024-03-18 NOTE — Patient Instructions (Addendum)
 Rybelsus 3 mg half hour before break fast with 4 oz of water  Glipizide  2.5 mg as needed if blood sugar is more than 200

## 2024-03-19 ENCOUNTER — Encounter: Payer: Self-pay | Admitting: "Endocrinology

## 2024-03-19 ENCOUNTER — Telehealth: Payer: Self-pay | Admitting: *Deleted

## 2024-03-19 DIAGNOSIS — E1122 Type 2 diabetes mellitus with diabetic chronic kidney disease: Secondary | ICD-10-CM | POA: Diagnosis not present

## 2024-03-19 DIAGNOSIS — R609 Edema, unspecified: Secondary | ICD-10-CM | POA: Diagnosis not present

## 2024-03-19 DIAGNOSIS — E876 Hypokalemia: Secondary | ICD-10-CM | POA: Diagnosis not present

## 2024-03-19 DIAGNOSIS — N390 Urinary tract infection, site not specified: Secondary | ICD-10-CM | POA: Diagnosis not present

## 2024-03-19 DIAGNOSIS — D631 Anemia in chronic kidney disease: Secondary | ICD-10-CM | POA: Diagnosis not present

## 2024-03-19 DIAGNOSIS — I129 Hypertensive chronic kidney disease with stage 1 through stage 4 chronic kidney disease, or unspecified chronic kidney disease: Secondary | ICD-10-CM | POA: Diagnosis not present

## 2024-03-19 DIAGNOSIS — N1832 Chronic kidney disease, stage 3b: Secondary | ICD-10-CM | POA: Diagnosis not present

## 2024-03-19 NOTE — Telephone Encounter (Signed)
 Called pt informed her that Dr Vertell Gory would discuss thyroid  labs at next visit.

## 2024-03-19 NOTE — Telephone Encounter (Unsigned)
 Copied from CRM [id number]. Topic: Clinical - Lab/Test Results >> Mar 19, 2024 11:49 AM Elle L wrote: Reason for CRM: The patient states that she completed lab orders from Dr. Cheril Cork on 6/5 that have not been released on MyChart. I did not see this reflected in her chart. She is requesting the lab orders be added to MyChart as she states [facility] has the results with the ordering provider listed as Dr. Cheril Cork. She needs them to be released for her Endocrinologist Dr. [name]. Her call back number is [phone] if needed but she states she does not require a call back if it can be posted.

## 2024-03-19 NOTE — Telephone Encounter (Signed)
 Spoke to pt informed her per Dr Vertell Gory not to stop any of her medication.

## 2024-03-22 ENCOUNTER — Other Ambulatory Visit: Payer: Self-pay | Admitting: Family Medicine

## 2024-03-22 ENCOUNTER — Encounter: Payer: Self-pay | Admitting: Family Medicine

## 2024-03-22 MED ORDER — CEPHALEXIN 500 MG PO CAPS: 500.0000 mg | ORAL_CAPSULE | Freq: Three times a day (TID) | ORAL | 0 refills | Status: DC

## 2024-03-22 NOTE — Telephone Encounter (Signed)
 Left message for patient to call and let us  know if she is still experiencing symptoms.

## 2024-03-25 ENCOUNTER — Other Ambulatory Visit: Payer: Self-pay

## 2024-03-25 ENCOUNTER — Encounter: Payer: Self-pay | Admitting: Family Medicine

## 2024-03-25 MED ORDER — AMPHETAMINE-DEXTROAMPHET ER 20 MG PO CP24: 20.0000 mg | ORAL_CAPSULE | ORAL | 0 refills | Status: DC

## 2024-03-26 ENCOUNTER — Encounter: Payer: Self-pay | Admitting: Family Medicine

## 2024-03-29 ENCOUNTER — Encounter: Payer: Self-pay | Admitting: "Endocrinology

## 2024-03-29 ENCOUNTER — Encounter: Payer: Self-pay | Admitting: Family Medicine

## 2024-03-29 ENCOUNTER — Encounter

## 2024-03-30 NOTE — Telephone Encounter (Signed)
 Lvm for pt to call back to schedule an appt to discuss thyroid  results.

## 2024-04-04 ENCOUNTER — Encounter: Payer: Self-pay | Admitting: Family Medicine

## 2024-04-05 ENCOUNTER — Other Ambulatory Visit: Payer: Self-pay

## 2024-04-05 DIAGNOSIS — G8929 Other chronic pain: Secondary | ICD-10-CM

## 2024-04-06 MED ORDER — ALPRAZOLAM 0.5 MG PO TABS: 0.5000 mg | ORAL_TABLET | Freq: Every evening | ORAL | 3 refills | Status: DC | PRN

## 2024-04-06 MED ORDER — TRAMADOL HCL 50 MG PO TABS: 100.0000 mg | ORAL_TABLET | Freq: Three times a day (TID) | ORAL | 0 refills | Status: DC | PRN

## 2024-04-08 ENCOUNTER — Encounter: Payer: Self-pay | Admitting: Family Medicine

## 2024-04-15 ENCOUNTER — Encounter: Admitting: Gastroenterology

## 2024-04-15 ENCOUNTER — Ambulatory Visit: Admitting: "Endocrinology

## 2024-04-15 ENCOUNTER — Encounter: Payer: Self-pay | Admitting: "Endocrinology

## 2024-04-15 VITALS — BP 106/80 | HR 83 | Ht 59.0 in | Wt 135.0 lb

## 2024-04-15 DIAGNOSIS — Z7984 Long term (current) use of oral hypoglycemic drugs: Secondary | ICD-10-CM | POA: Diagnosis not present

## 2024-04-15 DIAGNOSIS — E1140 Type 2 diabetes mellitus with diabetic neuropathy, unspecified: Secondary | ICD-10-CM | POA: Diagnosis not present

## 2024-04-15 DIAGNOSIS — E7800 Pure hypercholesterolemia, unspecified: Secondary | ICD-10-CM | POA: Diagnosis not present

## 2024-04-15 MED ORDER — RYBELSUS 7 MG PO TABS: 7.0000 mg | ORAL_TABLET | Freq: Every day | ORAL | 0 refills | Status: DC

## 2024-04-15 NOTE — Patient Instructions (Signed)
 Will recommend the following: Rybelsus  7 mg every day Glipizide  2.5mg  as needed for blood sugar more than BG>250

## 2024-04-15 NOTE — Progress Notes (Signed)
 Outpatient Endocrinology Note Obadiah Birmingham, MD  04/15/24   Tracy Price 67/19/1957 [id number]  Referring Provider: Duanne Butler DASEN, MD Primary Care Provider: Duanne Butler DASEN, MD Reason for consultation: Subjective   Assessment & Plan  Diagnoses and all orders for this visit:  Type 2 diabetes mellitus with diabetic neuropathy, without long-term current use of insulin  (HCC)  Pure hypercholesterolemia  Other orders -     Semaglutide  (RYBELSUS ) 7 MG TABS; Take 1 tablet (7 mg total) by mouth daily.     History of diabetes Type II complicated by +  neuropathy, No results found for: GFR Hba1c goal less than 7, current Hba1c is  Lab Results  Component Value Date   HGBA1C 5.3 03/18/2024   Will recommend the following: 03/12/24 EGFR 25, watch kidney function since rybelsus  can do rapid decline in kidney function per some reports per FDA so monitoring is required  Rybelsus  7 mg every day Glipizide  2.5mg  as needed for blood sugar more than BG>250  Taken on and off of diabetes medication by PCP, A1C has dipped to 4s and weight loss to 95 lbs on mounjaro  Self started old januvia  pills after last DM medications stopped by PCP  Worries about heart and stroke and ongoing postprandial hyperglycemia   Was on mounjaro  10 mg/week, was taken off in 09/2023 due to wt loss up to 95 lbs  Has a urine infection every month with E.Coli  Tried jardiance  in past for 1 mo Reports metformin  and Actos did not work   No known contraindications/side effects to any of above medications  -Last LD and Tg are as follows: Lab Results  Component Value Date   LDLCALC 45 09/01/2023    Lab Results  Component Value Date   TRIG 76 09/01/2023   -On rosuvastatin  10 mg QD -Follow low fat diet and exercise   -Blood pressure goal <140/90 - Microalbumin/creatinine goal is < 30 -Last MA/Cr is as follows: Lab Results  Component Value Date   MICROALBUR 0.5 03/29/2022   -on ACE/ARB losartan   100mg  every day, 03/12/24 EGFR 25 -diet changes including salt restriction -limit eating outside -counseled BP targets per standards of diabetes care -uncontrolled blood pressure can lead to retinopathy, nephropathy and cardiovascular and atherosclerotic heart disease  Reviewed and counseled on: -A1C target -Blood sugar targets -Complications of uncontrolled diabetes  -Checking blood sugar before meals and bedtime and bring log next visit -All medications with mechanism of action and side effects -Hypoglycemia management: rule of 15's, Glucagon Emergency Kit and medical alert ID -low-carb low-fat plate-method diet -At least 20 minutes of physical activity per day -Annual dilated retinal eye exam and foot exam -compliance and follow up needs -follow up as scheduled or earlier if problem gets worse  Call if blood sugar is less than 70 or consistently above 250    Take a 15 gm snack of carbohydrate at bedtime before you go to sleep if your blood sugar is less than 100.    If you are going to fast after midnight for a test or procedure, ask your physician for instructions on how to reduce/decrease your insulin  dose.    Call if blood sugar is less than 70 or consistently above 250  -Treating a low sugar by rule of 15  (15 gms of sugar every 15 min until sugar is more than 70) If you feel your sugar is low, test your sugar to be sure If your sugar is low (less than 70), then take  15 grams of a fast acting Carbohydrate (3-4 glucose tablets or glucose gel or 4 ounces of juice or regular soda) Recheck your sugar 15 min after treating low to make sure it is more than 70 If sugar is still less than 70, treat again with 15 grams of carbohydrate          Don't drive the hour of hypoglycemia  If unconscious/unable to eat or drink by mouth, use glucagon injection or nasal spray baqsimi and call 911. Can repeat again in 15 min if still unconscious.  Return in about 67 weeks (around 05/13/2024).  I  have reviewed current medications, nurse's notes, allergies, vital signs, past medical and surgical history, family medical history, and social history for this encounter. Counseled patient on symptoms, examination findings, lab findings, imaging results, treatment decisions and monitoring and prognosis. The patient understood the recommendations and agrees with the treatment plan. All questions regarding treatment plan were fully answered.  Obadiah Birmingham, MD  04/15/24    History of Present Illness Tracy Price is a 68 y.o. year old female who presents for follow up of Type II diabetes mellitus.  Tracy Price was first diagnosed around 2010.   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 The full report is scanned in the media. Reviewing the CGM trends, BG are low in the evening.  Physical Exam  BP 106/80   Pulse 83   Ht 4' 11 (1.499 m)   Wt 135 lb (61.2 kg)   SpO2 97%   BMI 27.27 kg/m    Constitutional: well developed, well nourished Head: normocephalic, atraumatic Eyes: sclera anicteric, no redness Neck: supple Lungs: normal respiratory effort Neurology: alert and oriented Skin: dry, no appreciable rashes Musculoskeletal: no appreciable defects Psychiatric: normal mood and affect Diabetic Foot Exam - Simple   No data filed      Current Medications Patient's Medications  New Prescriptions   SEMAGLUTIDE  (RYBELSUS ) 7 MG TABS    Take 1 tablet (7 mg total) by mouth daily.  Previous Medications    ALPRAZOLAM  (XANAX ) 0.5 MG TABLET    Take 1 tablet (0.5 mg total) by mouth at bedtime as needed for anxiety (90 day supply).   AMPHETAMINE -DEXTROAMPHETAMINE (ADDERALL XR) 20 MG 24 HR CAPSULE    Take 1 capsule (20 mg total) by mouth every morning.   BLOOD GLUCOSE METER KIT AND SUPPLIES    Dispense based on patient and insurance preference. Use up to four times daily as directed. (FOR ICD-10 E10.9, E11.9). Pt requests: ACCU-CHEK Guide Meter made by [employer]   CEPHALEXIN  (KEFLEX ) 500 MG CAPSULE    Take 1 capsule (500 mg total) by mouth 3 (three) times daily.   FERROUS SULFATE 325 (65 FE) MG TABLET       GLIPIZIDE  2.5 MG TABS    Take 1 tablet by mouth 2 (two) times daily as needed (for blood sugar more than 200).   GLUCOSE BLOOD (ACCU-CHEK GUIDE TEST)  TEST STRIP    USE TO CHECK BLOOD SUGARS 5 TIMES PER DAY.   GLUCOSE BLOOD (ACCU-CHEK GUIDE) TEST STRIP    USE TO CHECK BLOOD SUGARS 5 TIMES PER DAY.   LEVOTHYROXINE  (SYNTHROID ) 75 MCG TABLET    Take 1 tablet (75 mcg total) by mouth daily.   LOSARTAN  (COZAAR ) 100 MG TABLET    Take 1 tablet (100 mg total) by mouth daily.   MAGNESIUM 500 MG CAPS       MELATONIN 5 MG TABS    Take by mouth.   OVER THE COUNTER MEDICATION    Vitamin D 250 mg   PANTOPRAZOLE  (PROTONIX ) 40 MG TABLET    TAKE 1 TABLET BY MOUTH TWICE A DAY BEFORE A MEAL   ROSUVASTATIN  (CRESTOR ) 10 MG TABLET    Take 1 tablet (10 mg total) by mouth daily.   TRAMADOL  (ULTRAM ) 50 MG TABLET    Take 2 tablets (100 mg total) by mouth 3 (three) times daily as needed.  Modified Medications   No medications on file  Discontinued Medications   SEMAGLUTIDE  (RYBELSUS ) 3 MG TABS    Take 1 tablet (3 mg total) by mouth daily.    Allergies No Known Allergies  Past Medical History Past Medical History:  Diagnosis Date   ADD (attention deficit disorder)    Allergy    Anemia    Chest pain    CKD (chronic kidney disease) stage 3, GFR 30-59 ml/min (HCC)    Diabetes mellitus without complication (HCC)    type  2   GERD (gastroesophageal reflux disease)    Hyperlipidemia    Hypertension    Hypothyroidism    Osteoporosis    Sleep apnea 1995   had surgery to correct   Subdural hematoma [facility])     Past Surgical History Past Surgical History:  Procedure Laterality Date   COLONOSCOPY     CRANIOTOMY Left 03/05/2017   Procedure: LEFT BURR HOLE  HEMATOMA EVACUATION SUBDURAL;  Surgeon: Onetha Kuba, MD;  Location: [facility] OR;  Service: Neurosurgery;  Laterality: Left;   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  Social History Social History   Socioeconomic History   Marital status: Single    Spouse name: Not on file   Number of children: Not on file   Years of education: Not on file   Highest education level: Bachelor's degree (e.g., BA, AB, BS)  Occupational History   Not on file  Tobacco Use   Smoking status: Never   Smokeless tobacco: Never  Vaping Use   Vaping status: Never Used  Substance and Sexual Activity   Alcohol use: No   Drug use: No   Sexual activity: Not Currently  Other Topics Concern   Not on file  Social History Narrative   Not on file   Social Drivers of Health   Financial Resource Strain: Medium Risk (09/30/2023)   Overall Financial Resource Strain (CARDIA)    Difficulty of Paying Living Expenses: Somewhat hard  Food Insecurity: Food Insecurity Present (09/30/2023)   Hunger Vital Sign    Worried About Running Out of Food in the Last Year: Sometimes true    Ran Out of Food in the Last Year: Sometimes true  Transportation Needs: Unmet Transportation Needs (09/30/2023)  PRAPARE - Administrator, Civil Service (Medical): Yes    Lack of Transportation (Non-Medical): Yes  Physical Activity: Insufficiently Active (09/30/2023)   Exercise Vital Sign    Days of  Exercise per Week: 2 days    Minutes of Exercise per Session: 30 min  Stress: No Stress Concern Present (09/30/2023)   [employer] of Occupational Health - Occupational Stress Questionnaire    Feeling of Stress : Only a little  Social Connections: Moderately Integrated (09/30/2023)   Social Connection and Isolation Panel    Frequency of Communication with Friends and Family: More than three times a week    Frequency of Social Gatherings with Friends and Family: Twice a week    Attends Religious Services: 1 to 4 times per year    Active Member of Clubs or Organizations: Yes    Attends Banker Meetings: More than 4 times per year    Marital Status: Never married  Intimate Partner Violence: Not At Risk (04/17/2023)   Humiliation, Afraid, Rape, and Kick questionnaire    Fear of Current or Ex-Partner: No    Emotionally Abused: No    Physically Abused: No    Sexually Abused: No    Lab Results  Component Value Date   HGBA1C 5.3 03/18/2024   HGBA1C 6.2 (H) 09/01/2023   HGBA1C 4.8 04/22/2023   Lab Results  Component Value Date   CHOL 141 09/01/2023   Lab Results  Component Value Date   HDL 80 09/01/2023   Lab Results  Component Value Date   LDLCALC 45 09/01/2023   Lab Results  Component Value Date   TRIG 76 09/01/2023   Lab Results  Component Value Date   CHOLHDL 1.8 09/01/2023   Lab Results  Component Value Date   CREATININE 1.98 (H) 10/27/2023   No results found for: GFR Lab Results  Component Value Date   MICROALBUR 0.5 03/29/2022      Component Value Date/Time   NA 139 10/27/2023 1439   K 3.6 10/27/2023 1439   CL 95 (L) 10/27/2023 1439   CO2 32 10/27/2023 1439   GLUCOSE 141 (H) 10/27/2023 1439   BUN 42 (H) 10/27/2023 1439   CREATININE 1.98 (H) 10/27/2023 1439   CALCIUM  9.1 03/12/2024 0000   PROT 6.6 10/06/2023 1602   ALBUMIN 4.1 03/27/2017 1650   AST 48 (H) 10/06/2023 1602   ALT 55 (H) 10/06/2023 1602   ALKPHOS 134 (H) 03/27/2017  1650   BILITOT 0.8 10/06/2023 1602   GFRNONAA 42 (L) 04/12/2021 1106   GFRAA 48 (L) 04/12/2021 1106      03/12/2024   12:00 AM 12/24/2023   12:00 AM 12/05/2023   12:00 AM  BMP  Calcium  9.1  8.3     9.4         This result is from an external source.       Component Value Date/Time   WBC 7.6 09/01/2023 1041   RBC 3.61 (L) 09/01/2023 1041   HGB 11.8 09/01/2023 1041   HCT 35.6 09/01/2023 1041   PLT 174 09/01/2023 1041   MCV 98.6 09/01/2023 1041   MCH 32.7 09/01/2023 1041   MCHC 33.1 09/01/2023 1041   RDW 12.1 09/01/2023 1041   LYMPHSABS 1,540 05/29/2023 1034   MONOABS 540 03/27/2017 1650   EOSABS 578 (H) 09/01/2023 1041   BASOSABS 68 09/01/2023 1041     Parts of this note may have been dictated using voice recognition software. There may be variances  in spelling and vocabulary which are unintentional. Not all errors are proofread. Please notify the dino if any discrepancies are noted or if the meaning of any statement is not clear.

## 2024-04-16 ENCOUNTER — Encounter: Payer: Self-pay | Admitting: "Endocrinology

## 2024-04-22 ENCOUNTER — Encounter: Admitting: Family Medicine

## 2024-04-22 ENCOUNTER — Ambulatory Visit: Payer: Medicare PPO

## 2024-04-28 ENCOUNTER — Ambulatory Visit: Admitting: "Endocrinology

## 2024-04-29 ENCOUNTER — Telehealth: Payer: Self-pay

## 2024-04-29 ENCOUNTER — Encounter: Payer: Self-pay | Admitting: "Endocrinology

## 2024-04-29 ENCOUNTER — Telehealth: Payer: Self-pay | Admitting: "Endocrinology

## 2024-04-29 ENCOUNTER — Encounter: Attending: "Endocrinology | Admitting: Dietician

## 2024-04-29 VITALS — Ht 59.0 in | Wt 130.0 lb

## 2024-04-29 DIAGNOSIS — E1169 Type 2 diabetes mellitus with other specified complication: Secondary | ICD-10-CM | POA: Insufficient documentation

## 2024-04-29 DIAGNOSIS — E669 Obesity, unspecified: Secondary | ICD-10-CM | POA: Diagnosis not present

## 2024-04-29 MED ORDER — RYBELSUS 7 MG PO TABS: 7.0000 mg | ORAL_TABLET | Freq: Every day | ORAL | 1 refills | Status: DC

## 2024-04-29 NOTE — Telephone Encounter (Signed)
 Patient called this morning,she is a patient of Dr.Motwani.She had to reschedule her appointment from 05/10/24 to 06/03/24.She will be out of her RX Rybelsus  before then and wanted to know if a refill could be sent in to last until her next appointment.Stated that she has had no issues or problems on the medication.Please send the RX to the [facility] on [address] in [place].Her contact info is [phone]

## 2024-04-29 NOTE — Telephone Encounter (Signed)
 Copied from CRM [id number]. Topic: Appointments - Scheduling Inquiry for Clinic >> Apr 29, 2024 10:47 AM Selinda RAMAN wrote: Reason for CRM: The patient called in stating her insurance requires her Physical/AWV coming up to be coded as an Annual Well Visit that way there will be no copay. Please assist patient further as she only wants the AWV not a physical. She also is asking if this can be over the phone and not in the office unless her provider needs to see her.

## 2024-04-29 NOTE — Telephone Encounter (Signed)
 Requested Prescriptions   Signed Prescriptions Disp Refills   Semaglutide  (RYBELSUS ) 7 MG TABS 30 tablet 1    Sig: Take 1 tablet (7 mg total) by mouth daily.    Authorizing Provider: DARTHA ERNST    Ordering User: CLEOTILDE ROLIN RAMAN

## 2024-04-29 NOTE — Telephone Encounter (Signed)
 Patient left message requesting Rybelsus  refill. Medication has been sent

## 2024-04-30 ENCOUNTER — Other Ambulatory Visit: Payer: Self-pay

## 2024-04-30 ENCOUNTER — Telehealth: Payer: Self-pay

## 2024-04-30 NOTE — Telephone Encounter (Signed)
 Patient sent a message requesting increase in Rybelsus  to 14mg , stating that was the plan if she tolerated 7mg , however per last office note it does not mention increasing.  7mg  dose refilled on 04/27/24.   Patient sent message making her aware of chart notes and message forwarded to MD to look at when she returns on 7/28.

## 2024-05-02 DIAGNOSIS — E109 Type 1 diabetes mellitus without complications: Secondary | ICD-10-CM | POA: Diagnosis not present

## 2024-05-03 NOTE — Progress Notes (Signed)
 Patient was seen on 04/29/2024 for the first of a series of three diabetes self-management courses at the [facility].  Patient Education Plan per assessed needs and concerns is to attend three course education program for Diabetes Self Management Education.  A1C was 5.3% on 03/18/2024.  The following learning objectives were met by the patient during this class: Describe diabetes, types of diabetes and pathophysiology State some common risk factors for diabetes Defines the role of glucose and insulin  Describe the relationship between diabetes and cardiovascular and other risks State the members of the Healthcare Team States the rationale for glucose monitoring and when to test State their individual Target Range State the importance of logging glucose readings and how to interpret the readings Identifies A1C target Explain the correlation between A1c and eAG values State symptoms and treatment of high blood glucose and low blood glucose Explain proper technique for glucose testing and identify proper sharps disposal  Handouts given during class include: How to Thrive:  A Guide for Your Journey with Diabetes by the ADA Meal Plan Card and carbohydrate content list Dietary intake form Low Sodium Flavoring Tips Types of Fats Dining Out Label reading Snack list The diabetes portion plate Diabetes Resources A1c to eAG Conversion Chart Blood Glucose Log Diabetes Recommended Care Schedule Support Group Diabetes Success Plan Core Class Satisfaction Survey   Follow-Up Plan: Attend core 2

## 2024-05-05 ENCOUNTER — Ambulatory Visit

## 2024-05-05 ENCOUNTER — Encounter: Payer: Self-pay | Admitting: "Endocrinology

## 2024-05-05 ENCOUNTER — Ambulatory Visit: Admitting: "Endocrinology

## 2024-05-05 VITALS — Ht 59.0 in | Wt 130.0 lb

## 2024-05-05 VITALS — BP 140/80 | HR 85 | Ht 59.0 in | Wt 130.0 lb

## 2024-05-05 DIAGNOSIS — E7800 Pure hypercholesterolemia, unspecified: Secondary | ICD-10-CM | POA: Diagnosis not present

## 2024-05-05 DIAGNOSIS — E1140 Type 2 diabetes mellitus with diabetic neuropathy, unspecified: Secondary | ICD-10-CM

## 2024-05-05 DIAGNOSIS — Z0000 Encounter for general adult medical examination without abnormal findings: Secondary | ICD-10-CM

## 2024-05-05 DIAGNOSIS — Z7984 Long term (current) use of oral hypoglycemic drugs: Secondary | ICD-10-CM

## 2024-05-05 MED ORDER — RYBELSUS 14 MG PO TABS
14.0000 mg | ORAL_TABLET | Freq: Every day | ORAL | 1 refills | Status: DC
Start: 2024-05-05 — End: 2024-06-22

## 2024-05-05 MED ORDER — RYBELSUS 14 MG PO TABS: 14.0000 mg | ORAL_TABLET | Freq: Every day | ORAL | 1 refills | Status: DC

## 2024-05-05 NOTE — Progress Notes (Signed)
 Outpatient Endocrinology Note Obadiah Birmingham, MD  05/05/24   Tracy Price [id number] [id number]  Referring Provider: Duanne Butler DASEN, MD Primary Care Provider: Duanne Butler DASEN, MD Reason for consultation: Subjective   Assessment & Plan  Diagnoses and all orders for this visit:  Type 2 diabetes mellitus with diabetic neuropathy, without long-term current use of insulin  (HCC)  Pure hypercholesterolemia  Other orders -     Discontinue: Semaglutide  (RYBELSUS ) 14 MG TABS; Take 1 tablet (14 mg total) by mouth daily. -     Semaglutide  (RYBELSUS ) 14 MG TABS; Take 1 tablet (14 mg total) by mouth daily.   History of diabetes Type II complicated by +  neuropathy, No results found for: GFR Hba1c goal less than 7, current Hba1c is  Lab Results  Component Value Date   HGBA1C 5.3 03/18/2024   Will recommend the following: 03/12/24 EGFR 25, watch kidney function since rybelsus  can do rapid decline in kidney function per some reports per FDA so monitoring is required  Rybelsus  14 mg every day Glipizide  2.5mg  as needed for blood sugar more than BG>250  Taken on and off of diabetes medication by PCP, A1C has dipped to 4s and weight loss to 95 lbs on mounjaro  Self started old januvia  pills after last DM medications stopped by PCP  Worries about heart and stroke and ongoing postprandial hyperglycemia   Was on mounjaro  10 mg/week, was taken off in 09/2023 due to wt loss up to 95 lbs  Has a urine infection every month with E.Coli  Tried jardiance  in past for 1 mo Reports metformin  and Actos did not work   No known contraindications/side effects to any of above medications  -Last LD and Tg are as follows: Lab Results  Component Value Date   LDLCALC 45 09/01/2023    Lab Results  Component Value Date   TRIG 76 09/01/2023   -On rosuvastatin  10 mg QD -Follow low fat diet and exercise   -Blood pressure goal <140/90 - Microalbumin/creatinine goal is < 30 -Last MA/Cr is as  follows: Lab Results  Component Value Date   MICROALBUR 0.5 03/29/2022   -on ACE/ARB losartan  100mg  every day, 03/12/24 EGFR 25, sees a nephrologist  -diet changes including salt restriction -limit eating outside -counseled BP targets per standards of diabetes care -uncontrolled blood pressure can lead to retinopathy, nephropathy and cardiovascular and atherosclerotic heart disease  Reviewed and counseled on: -A1C target -Blood sugar targets -Complications of uncontrolled diabetes  -Checking blood sugar before meals and bedtime and bring log next visit -All medications with mechanism of action and side effects -Hypoglycemia management: rule of 15's, Glucagon Emergency Kit and medical alert ID -low-carb low-fat plate-method diet -At least 20 minutes of physical activity per day -Annual dilated retinal eye exam and foot exam -compliance and follow up needs -follow up as scheduled or earlier if problem gets worse  Call if blood sugar is less than 70 or consistently above 250    Take a 15 gm snack of carbohydrate at bedtime before you go to sleep if your blood sugar is less than 100.    If you are going to fast after midnight for a test or procedure, ask your physician for instructions on how to reduce/decrease your insulin  dose.    Call if blood sugar is less than 70 or consistently above 250  -Treating a low sugar by rule of 15  (15 gms of sugar every 15 min until sugar is more than 70) If  you feel your sugar is low, test your sugar to be sure If your sugar is low (less than 70), then take 15 grams of a fast acting Carbohydrate (3-4 glucose tablets or glucose gel or 4 ounces of juice or regular soda) Recheck your sugar 15 min after treating low to make sure it is more than 70 If sugar is still less than 70, treat again with 15 grams of carbohydrate          Don't drive the hour of hypoglycemia  If unconscious/unable to eat or drink by mouth, use glucagon injection or nasal spray  baqsimi and call 911. Can repeat again in 15 min if still unconscious.  Return in about 3 months (around 08/05/2024).  I have reviewed current medications, nurse's notes, allergies, vital signs, past medical and surgical history, family medical history, and social history for this encounter. Counseled patient on symptoms, examination findings, lab findings, imaging results, treatment decisions and monitoring and prognosis. The patient understood the recommendations and agrees with the treatment plan. All questions regarding treatment plan were fully answered.  Obadiah Birmingham, MD  05/05/24    History of Present Illness Tracy Price is a 68 y.o. year year old female who presents for follow up of Type II diabetes mellitus.  Tracy Price was first diagnosed around 2010.   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 The full report is scanned in the media. Reviewing the CGM trends, BG are well controlled with some highs after meals.  Physical Exam  BP (!) 140/80   Pulse 85   Ht 4' 11 (1.499 m)   Wt 130 lb (59 kg)   SpO2 95%   BMI 26.26 kg/m    Constitutional: well developed, well nourished Head: normocephalic, atraumatic Eyes: sclera anicteric, no redness Neck: supple Lungs: normal respiratory effort Neurology: alert and oriented Skin: dry, no appreciable rashes Musculoskeletal: no appreciable defects Psychiatric: normal mood and affect Diabetic Foot Exam - Simple   No data filed      Current Medications Patient's  Medications  New Prescriptions   SEMAGLUTIDE  (RYBELSUS ) 14 MG TABS    Take 1 tablet (14 mg total) by mouth daily.  Previous Medications   ALPRAZOLAM  (XANAX ) 0.5 MG TABLET    Take 1 tablet (0.5 mg total) by mouth at bedtime as needed for anxiety (90 day supply).   AMPHETAMINE -DEXTROAMPHETAMINE (ADDERALL XR) 20 MG 24 HR CAPSULE    Take 1 capsule (20 mg total) by mouth every morning.   BLOOD GLUCOSE METER KIT AND SUPPLIES    Dispense based on patient and insurance preference. Use up to four times daily as directed. (FOR ICD-10 E10.9, E11.9). Pt requests: ACCU-CHEK Guide Meter made by [employer]   FERROUS SULFATE 325 (65 FE) MG TABLET       GLIPIZIDE  2.5 MG TABS    Take 1 tablet by mouth 2 (two) times daily as needed (for blood sugar more than 200).   GLUCOSE BLOOD (ACCU-CHEK GUIDE TEST) TEST STRIP  USE TO CHECK BLOOD SUGARS 5 TIMES PER DAY.   GLUCOSE BLOOD (ACCU-CHEK GUIDE) TEST STRIP    USE TO CHECK BLOOD SUGARS 5 TIMES PER DAY.   LEVOTHYROXINE  (SYNTHROID ) 75 MCG TABLET    Take 1 tablet (75 mcg total) by mouth daily.   LOSARTAN  (COZAAR ) 100 MG TABLET    Take 1 tablet (100 mg total) by mouth daily.   MAGNESIUM 500 MG CAPS       MELATONIN 5 MG TABS    Take by mouth.   ROSUVASTATIN  (CRESTOR ) 10 MG TABLET    Take 1 tablet (10 mg total) by mouth daily.   TRAMADOL  (ULTRAM ) 50 MG TABLET    Take 2 tablets (100 mg total) by mouth 3 (three) times daily as needed.  Modified Medications   No medications on file  Discontinued Medications   SEMAGLUTIDE  (RYBELSUS ) 7 MG TABS    Take 1 tablet (7 mg total) by mouth daily.    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[facility])     Past Surgical History Past Surgical History:  Procedure Laterality Date   BRAIN SURGERY  2018   COLONOSCOPY     CRANIOTOMY Left 03/05/2017   Procedure: LEFT BURR HOLE  HEMATOMA EVACUATION SUBDURAL;  Surgeon: Onetha Kuba, MD;  Location: [facility] OR;  Service: Neurosurgery;  Laterality: Left;   FRACTURE SURGERY  1997   Left arm-pins installed to hold bones together   GASTRIC BYPASS  2005   orif arm     SMALL INTESTINE SURGERY  2000   Gastric bypass   TONSILLECTOMY      Family History family history includes Arthritis in her mother; COPD in her brother; Cancer in her father and mother; Colon cancer in her father; Colon polyps in her brother; Depression in her sister; Diabetes (age of onset: 24) in her sister; Early death in her sister; Hearing loss in her brother and sister; Heart disease in her brother and mother; Kidney disease in her sister; Obesity in her sister; Stroke in her brother, father, and mother.  Social History Social History   Socioeconomic History   Marital status: Single    Spouse name: Not on file   Number of children: Not on file   Years of education: Not on file   Highest education level: Bachelor's degree (e.g., BA, AB, BS)  Occupational History   Not on file  Tobacco Use   Smoking status: Never   Smokeless tobacco: Never   Tobacco comments:    None  Vaping Use   Vaping status: Never Used  Substance and Sexual Activity   Alcohol use: No   Drug use: No   Sexual activity: Not Currently  Other Topics Concern   Not on file  Social History Narrative   Not on file   Social Drivers of Health   Financial Resource Strain: High Risk (04/30/2024)  Overall Financial Resource Strain (CARDIA)    Difficulty of Paying Living Expenses: Hard  Food Insecurity: Food Insecurity Present (04/30/2024)   Hunger Vital Sign     Worried About Running Out of Food in the Last Year: Often true    Ran Out of Food in the Last Year: Often true  Transportation Needs: No Transportation Needs (04/30/2024)   PRAPARE - Administrator, Civil Service (Medical): No    Lack of Transportation (Non-Medical): No  Physical Activity: Insufficiently Active (04/30/2024)   Exercise Vital Sign    Days of Exercise per Week: 2 days    Minutes of Exercise per Session: 40 min  Stress: Stress Concern Present (04/30/2024)   [employer] of Occupational Health - Occupational Stress Questionnaire    Feeling of Stress: Rather much  Social Connections: Moderately Integrated (04/30/2024)   Social Connection and Isolation Panel    Frequency of Communication with Friends and Family: More than three times a week    Frequency of Social Gatherings with Friends and Family: Twice a week    Attends Religious Services: More than 4 times per year    Active Member of Clubs or Organizations: Yes    Attends Banker Meetings: 1 to 4 times per year    Marital Status: Never married  Intimate Partner Violence: Not At Risk (05/05/2024)   Humiliation, Afraid, Rape, and Kick questionnaire    Fear of Current or Ex-Partner: No    Emotionally Abused: No    Physically Abused: No    Sexually Abused: No    Lab Results  Component Value Date   HGBA1C 5.3 03/18/2024   HGBA1C 6.2 (H) 09/01/2023   HGBA1C 4.8 04/22/2023   Lab Results  Component Value Date   CHOL 141 09/01/2023   Lab Results  Component Value Date   HDL 80 09/01/2023   Lab Results  Component Value Date   LDLCALC 45 09/01/2023   Lab Results  Component Value Date   TRIG 76 09/01/2023   Lab Results  Component Value Date   CHOLHDL 1.8 09/01/2023   Lab Results  Component Value Date   CREATININE 1.98 (H) 10/27/2023   No results found for: GFR Lab Results  Component Value Date   MICROALBUR 0.5 03/29/2022      Component Value Date/Time   NA 139 10/27/2023  1439   K 3.6 10/27/2023 1439   CL 95 (L) 10/27/2023 1439   CO2 32 10/27/2023 1439   GLUCOSE 141 (H) 10/27/2023 1439   BUN 42 (H) 10/27/2023 1439   CREATININE 1.98 (H) 10/27/2023 1439   CALCIUM  9.1 03/12/2024 0000   PROT 6.6 10/06/2023 1602   ALBUMIN 4.1 03/27/2017 1650   AST 48 (H) 10/06/2023 1602   ALT 55 (H) 10/06/2023 1602   ALKPHOS 134 (H) 03/27/2017 1650   BILITOT 0.8 10/06/2023 1602   GFRNONAA 42 (L) 04/12/2021 1106   GFRAA 48 (L) 04/12/2021 1106      03/12/2024   12:00 AM 12/24/2023   12:00 AM 12/05/2023   12:00 AM  BMP  Calcium  9.1  8.3     9.4         This result is from an external source.       Component Value Date/Time   WBC 7.6 09/01/2023 1041   RBC 3.61 (L) 09/01/2023 1041   HGB 11.8 09/01/2023 1041   HCT 35.6 09/01/2023 1041   PLT 174 09/01/2023 1041   MCV 98.6 09/01/2023 1041   MCH  32.7 09/01/2023 1041   MCHC 33.1 09/01/2023 1041   RDW 12.1 09/01/2023 1041   LYMPHSABS 1,540 05/29/2023 1034   MONOABS 540 03/27/2017 1650   EOSABS 578 (H) 09/01/2023 1041   BASOSABS 68 09/01/2023 1041     Parts of this note may have been dictated using voice recognition software. There may be variances in spelling and vocabulary which are unintentional. Not all errors are proofread. Please notify the dino if any discrepancies are noted or if the meaning of any statement is not clear.

## 2024-05-05 NOTE — Patient Instructions (Signed)

## 2024-05-05 NOTE — Patient Instructions (Signed)
 Tracy Price , Thank you for taking time out of your busy schedule to complete your Annual Wellness Visit with me. I enjoyed our conversation and look forward to speaking with you again next year. I, as well as your care team,  appreciate your ongoing commitment to your health goals. Please review the following plan we discussed and let me know if I can assist you in the future. Your Game plan/ To Do List     Follow up Visits: Next Medicare AWV with our clinical staff: In 1 year    Have you seen your provider in the last 6 months (3 months if uncontrolled diabetes)? Yes Next Office Visit with your provider: To be scheduled  Clinician Recommendations:  Aim for 30 minutes of exercise or brisk walking, 6-8 glasses of water, and 5 servings of fruits and vegetables each day.       This is a list of the screening recommended for you and due dates:  Health Maintenance  Topic Date Due   Colon Cancer Screening  08/18/2023   COVID-19 Vaccine (6 - 2024-25 season) 12/05/2023   Flu Shot  05/07/2024   Eye exam for diabetics  06/10/2024   Hemoglobin A1C  09/17/2024   Complete foot exam   10/05/2024   Yearly kidney function blood test for diabetes  03/12/2025   Yearly kidney health urinalysis for diabetes  03/16/2025   Medicare Annual Wellness Visit  05/05/2025   Mammogram  08/13/2025   DTaP/Tdap/Td vaccine (2 - Td or Tdap) 06/20/2027   Pneumococcal Vaccine for age over 21  Completed   DEXA scan (bone density measurement)  Completed   Hepatitis C Screening  Completed   Zoster (Shingles) Vaccine  Completed   Hepatitis B Vaccine  Aged Out   HPV Vaccine  Aged Out   Meningitis B Vaccine  Aged Out    Advanced directives: (ACP Link)Information on Advanced Care Planning can be found at Hotel manager Advance Health Care Directives Advance Health Care Directives. [email or web address]   Advance Care Planning is important because it:  [x]  Makes sure you receive the medical care that is consistent  with your values, goals, and preferences  [x]  It provides guidance to your family and loved ones and reduces their decisional burden about whether or not they are making the right decisions based on your wishes.  Follow the link provided in your after visit summary or read over the paperwork we have mailed to you to help you started getting your Advance Directives in place. If you need assistance in completing these, please reach out to us  so that we can help you!  See attachments for Preventive Care and Fall Prevention Tips.

## 2024-05-05 NOTE — Progress Notes (Signed)
 Subjective:   Tracy Price is a 68 y.o. who presents for a Medicare Wellness preventive visit.  As a reminder, Annual Wellness Visits don't include a physical exam, and some assessments may be limited, especially if this visit is performed virtually. We may recommend an in-person follow-up visit with your provider if needed.  Visit Complete: Virtual I connected with  Tracy Price on 05/05/24 by a audio enabled telemedicine application and verified that I am speaking with the correct person using two identifiers.  Patient Location: Home  Provider Location: Home Office  I discussed the limitations of evaluation and management by telemedicine. The patient expressed understanding and agreed to proceed.  Vital Signs: Because this visit was a virtual/telehealth visit, some criteria may be missing or patient reported. Any vitals not documented were not able to be obtained and vitals that have been documented are patient reported.  VideoDeclined- This patient declined Librarian, academic. Therefore the visit was completed with audio only.  Persons Participating in Visit: Patient.  AWV Questionnaire: Yes: Patient Medicare AWV questionnaire was completed by the patient on 04/30/24; I have confirmed that all information answered by patient is correct and no changes since this date.  Cardiac Risk Factors include: advanced age (>44men, >67 women);diabetes mellitus;dyslipidemia;hypertension     Objective:    Today's Vitals   05/05/24 1209  Weight: 130 lb (59 kg)  Height: 4' 11 (1.499 m)   Body mass index is 26.26 kg/m.     05/05/2024   12:22 PM 05/03/2024    3:34 PM 04/17/2023    1:01 PM 08/02/2020    7:01 PM 04/02/2017   11:23 AM 03/10/2017    6:01 PM 03/06/2017    1:00 AM  Advanced Directives  Does Patient Have a Medical Advance Directive? No Yes No Yes Yes  Yes  No   Type of Advance Directive    Living will Living will Living will   Does patient want  to make changes to medical advance directive?  No - Patient declined  No - Patient declined  No - Patient declined    Would patient like information on creating a medical advance directive? Yes (MAU/Ambulatory/Procedural Areas - Information given)  Yes (MAU/Ambulatory/Procedural Areas - Information given)   No - Patient declined  No - Patient declined      Data saved with a previous flowsheet row definition    Current Medications (verified) Outpatient Encounter Medications as of 05/05/2024  Medication Sig   ALPRAZolam  (XANAX ) 0.5 MG tablet Take 1 tablet (0.5 mg total) by mouth at bedtime as needed for anxiety (90 day supply).   amphetamine -dextroamphetamine (ADDERALL XR) 20 MG 24 hr capsule Take 1 capsule (20 mg total) by mouth every morning.   blood glucose meter kit and supplies Dispense based on patient and insurance preference. Use up to four times daily as directed. (FOR ICD-10 E10.9, E11.9). Pt requests: ACCU-CHEK Guide Meter made by [employer]   ferrous sulfate 325 (65 FE) MG tablet    glucose blood (ACCU-CHEK GUIDE TEST) test strip USE TO CHECK BLOOD SUGARS 5 TIMES PER DAY.   glucose blood (ACCU-CHEK GUIDE) test strip USE TO CHECK BLOOD SUGARS 5 TIMES PER DAY.   levothyroxine  (SYNTHROID ) 75 MCG tablet Take 1 tablet (75 mcg total) by mouth daily.   losartan  (COZAAR ) 100 MG tablet Take 1 tablet (100 mg total) by mouth daily.   Magnesium 500 MG CAPS    Melatonin 5 MG TABS Take by mouth.  rosuvastatin  (CRESTOR ) 10 MG tablet Take 1 tablet (10 mg total) by mouth daily.   Semaglutide  (RYBELSUS ) 7 MG TABS Take 1 tablet (7 mg total) by mouth daily.   traMADol  (ULTRAM ) 50 MG tablet Take 2 tablets (100 mg total) by mouth 3 (three) times daily as needed.   glipiZIDE  2.5 MG TABS Take 1 tablet by mouth 2 (two) times daily as needed (for blood sugar more than 200). (Patient not taking: Reported on 05/05/2024)   [DISCONTINUED] cephALEXin  (KEFLEX ) 500 MG capsule Take 1 capsule (500 mg total) by mouth 3  (three) times daily.   [DISCONTINUED] OVER THE COUNTER MEDICATION Vitamin D 250 mg   [DISCONTINUED] pantoprazole  (PROTONIX ) 40 MG tablet TAKE 1 TABLET BY MOUTH TWICE A DAY BEFORE A MEAL   No facility-administered encounter medications on file as of 05/05/2024.    Allergies (verified) Patient has no known allergies.   History: Past Medical History:  Diagnosis Date   ADD (attention deficit disorder)    Allergy    Anemia    Anxiety 2018   Chest pain    CKD (chronic kidney disease) stage 3, GFR 30-59 ml/min (HCC)    Depression 7 years ago   Had a brain injury   Diabetes mellitus without complication (HCC)    type 2   GERD (gastroesophageal reflux disease)    Hyperlipidemia    Hypertension    Hypothyroidism    Osteoporosis    Sleep apnea 1995   had surgery to correct   Subdural hematoma [facility])    Past Surgical History:  Procedure Laterality Date   BRAIN SURGERY  2018   COLONOSCOPY     CRANIOTOMY Left 03/05/2017   Procedure: LEFT BURR HOLE  HEMATOMA EVACUATION SUBDURAL;  Surgeon: Onetha Kuba, MD;  [facility];  Service: Neurosurgery;  Laterality: Left;   FRACTURE SURGERY  1997   Left arm-pins installed to hold bones together   GASTRIC BYPASS  2005   orif arm     SMALL INTESTINE SURGERY  2000   Gastric bypass   TONSILLECTOMY     Family History  Problem Relation Age of Onset   Colon cancer Father    Cancer Father    Stroke Father    Heart disease Mother    Arthritis Mother    Cancer Mother    Stroke Mother    Diabetes Sister 75       heart attack   Depression Sister    Early death Sister    Hearing loss Sister    Kidney disease Sister    Obesity Sister    Heart disease Brother    Colon polyps Brother    COPD Brother    Hearing loss Brother    Stroke Brother    Breast cancer Neg Hx    Esophageal cancer Neg Hx    Rectal cancer Neg Hx    Stomach cancer Neg Hx    Social History   Socioeconomic History   Marital status: Single    Spouse name: Not on  file   Number of children: Not on file   Years of education: Not on file   Highest education level: Bachelor's degree (e.g., BA, AB, BS)  Occupational History   Not on file  Tobacco Use   Smoking status: Never   Smokeless tobacco: Never   Tobacco comments:    None  Vaping Use   Vaping status: Never Used  Substance and Sexual Activity   Alcohol use: No   Drug use:  No   Sexual activity: Not Currently  Other Topics Concern   Not on file  Social History Narrative   Not on file   Social Drivers of Health   Financial Resource Strain: High Risk (04/30/2024)   Overall Financial Resource Strain (CARDIA)    Difficulty of Paying Living Expenses: Hard  Food Insecurity: Food Insecurity Present (04/30/2024)   Hunger Vital Sign    Worried About Running Out of Food in the Last Year: Often true    Ran Out of Food in the Last Year: Often true  Transportation Needs: No Transportation Needs (04/30/2024)   PRAPARE - Administrator, Civil Service (Medical): No    Lack of Transportation (Non-Medical): No  Physical Activity: Insufficiently Active (04/30/2024)   Exercise Vital Sign    Days of Exercise per Week: 2 days    Minutes of Exercise per Session: 40 min  Stress: Stress Concern Present (04/30/2024)   [employer] of Occupational Health - Occupational Stress Questionnaire    Feeling of Stress: Rather much  Social Connections: Moderately Integrated (04/30/2024)   Social Connection and Isolation Panel    Frequency of Communication with Friends and Family: More than three times a week    Frequency of Social Gatherings with Friends and Family: Twice a week    Attends Religious Services: More than 4 times per year    Active Member of [employer] or Organizations: Yes    Attends Banker Meetings: 1 to 4 times per year    Marital Status: Never married    Tobacco Counseling Counseling given: Not Answered Tobacco comments: None    Clinical Intake:  Pre-visit  preparation completed: Yes  Pain : No/denies pain  Diabetes: Yes CBG done?: No Did pt. bring in CBG monitor from home?: No  Lab Results  Component Value Date   HGBA1C 5.3 03/18/2024   HGBA1C 6.2 (H) 09/01/2023   HGBA1C 4.8 04/22/2023     How often do you need to have someone help you when you read instructions, pamphlets, or other written materials from your doctor or pharmacy?: 1 - Never  Interpreter Needed?: No  Information entered by :: Charmaine Bloodgood LPN   Activities of Daily Living     05/05/2024   12:22 PM  In your present state of health, do you have any difficulty performing the following activities:  Hearing? 0  Vision? 0  Difficulty concentrating or making decisions? 0  Walking or climbing stairs? 0  Dressing or bathing? 0  Doing errands, shopping? 0  Preparing Food and eating ? N  Using the Toilet? N  In the past six months, have you accidently leaked urine? N  Do you have problems with loss of bowel control? N  Managing your Medications? N  Managing your Finances? N  Housekeeping or managing your Housekeeping? N    Patient Care Team: Duanne Butler DASEN, MD as PCP - General (Family Medicine) Knox Leita CROME, RD as Dietitian (Dietician) Dartha Ernst, MD as Consulting Physician (Endocrinology) [facility], [facility]  I have updated your Care Teams any recent Medical Services you may have received from other providers in the past year.     Assessment:   This is a routine wellness examination for Tracy Price.  Hearing/Vision screen Hearing Screening - Comments:: Denies hearing difficulties   Vision Screening - Comments:: No vision problems; diabetic eye exam in office 06/11/23   Goals Addressed             This Visit's  Progress    Maintain health and independence   On track      Depression Screen     05/05/2024   12:20 PM 05/03/2024    3:34 PM 10/06/2023    3:29 PM 05/29/2023   10:22 AM 04/17/2023   12:52 PM 10/29/2022   11:22 AM 04/12/2021    10:31 AM  PHQ 2/9 Scores  PHQ - 2 Score 0 0 1 2 2 2  0  PHQ- 9 Score     6 6     Fall Risk     05/05/2024   12:22 PM 05/03/2024    3:34 PM 10/06/2023    3:29 PM 05/29/2023   10:22 AM 04/22/2023    4:41 PM  Fall Risk   Falls in the past year? 1 0 0 1 1  Number falls in past yr: 1  0 1 1  Injury with Fall? 0  0 1 0  Risk for fall due to : History of fall(s)   Impaired balance/gait;History of fall(s) History of fall(s);Impaired balance/gait  Follow up Education provided;Falls prevention discussed;Falls evaluation completed   Falls prevention discussed Education provided;Falls prevention discussed;Falls evaluation completed    MEDICARE RISK AT HOME:  Medicare Risk at Home Any stairs in or around the home?: No If so, are there any without handrails?: No Home free of loose throw rugs in walkways, pet beds, electrical cords, etc?: Yes Adequate lighting in your home to reduce risk of falls?: Yes Life alert?: No Use of a cane, walker or w/c?: No Grab bars in the bathroom?: Yes Shower chair or bench in shower?: No Elevated toilet seat or a handicapped toilet?: No  TIMED UP AND GO:  Was the test performed?  No  Cognitive Function: Declined/Normal: No cognitive concerns noted by patient or family. Patient alert, oriented, able to answer questions appropriately and recall recent events. No signs of memory loss or confusion.        04/17/2023    1:00 PM  6CIT Screen  What Year? 0 points  What month? 0 points  What time? 0 points  Count back from 20 0 points  Months in reverse 0 points  Repeat phrase 0 points  Total Score 0 points    Immunizations Immunization History  Administered Date(s) Administered   Fluad Quad(high Dose 65+) 06/20/2022   Influenza, High Dose Seasonal PF 05/22/2023   Influenza,inj,Quad PF,6+ Mos 06/17/2013, 06/21/2014, 05/27/2017, 06/04/2018, 06/04/2019, 06/02/2020   Influenza-Unspecified 06/13/2016, 06/04/2018, 06/25/2021, 05/22/2023   PFIZER  Comirnaty(Gray Top)Covid-19 Tri-Sucrose Vaccine 06/25/2022   PFIZER(Purple Top)SARS-COV-2 Vaccination 11/18/2019, 12/11/2019, 06/27/2020   PNEUMOCOCCAL CONJUGATE-20 03/29/2022   Palivizumab 05/17/2022   Pfizer(Comirnaty)Fall Seasonal Vaccine 12 years and older 06/05/2023   Pneumococcal Polysaccharide-23 03/14/2015   Tdap 06/19/2017   Unspecified SARS-COV-2 Vaccination 06/25/2022, 06/05/2023   Zoster Recombinant(Shingrix) 06/04/2019, 08/20/2019    Screening Tests Health Maintenance  Topic Date Due   Colonoscopy  08/18/2023   COVID-19 Vaccine (6 - 2024-25 season) 12/05/2023   INFLUENZA VACCINE  05/07/2024   OPHTHALMOLOGY EXAM  06/10/2024   HEMOGLOBIN A1C  09/17/2024   FOOT EXAM  10/05/2024   Diabetic kidney evaluation - eGFR measurement  03/12/2025   Diabetic kidney evaluation - Urine ACR  03/16/2025   Medicare Annual Wellness (AWV)  05/05/2025   MAMMOGRAM  08/13/2025   DTaP/Tdap/Td (2 - Td or Tdap) 06/20/2027   Pneumococcal Vaccine: 50+ Years  Completed   DEXA SCAN  Completed   Hepatitis C Screening  Completed   Zoster Vaccines- Shingrix  Completed   Hepatitis B Vaccines  Aged Out   HPV VACCINES  Aged Out   Meningococcal B Vaccine  Aged Out    Health Maintenance  Health Maintenance Due  Topic Date Due   Colonoscopy  08/18/2023   COVID-19 Vaccine (6 - 2024-25 season) 12/05/2023   Health Maintenance Items Addressed: Patient would like to hold on colon screening at this time; she will contact GI to schedule   Additional Screening:  Vision Screening: Recommended annual ophthalmology exams for early detection of glaucoma and other disorders of the eye. Would you like a referral to an eye doctor? No    Dental Screening: Recommended annual dental exams for proper oral hygiene  Community Resource Referral / Chronic Care Management: CRR required this visit?  No   CCM required this visit?  No   Plan:    I have personally reviewed and noted the following in the  patient's chart:   Medical and social history Use of alcohol, tobacco or illicit drugs  Current medications and supplements including opioid prescriptions. Patient is not currently taking opioid prescriptions. Functional ability and status Nutritional status Physical activity Advanced directives List of other physicians Hospitalizations, surgeries, and ER visits in previous 12 months Vitals Screenings to include cognitive, depression, and falls Referrals and appointments  In addition, I have reviewed and discussed with patient certain preventive protocols, quality metrics, and best practice recommendations. A written personalized care plan for preventive services as well as general preventive health recommendations were provided to patient.   Lavelle Pfeiffer [place], [place]   2[birth year]7974   After Visit Summary: (MyChart) Due to this being a telephonic visit, the after visit summary with patients personalized plan was offered to patient via MyChart   Notes: Nothing significant to report at this time.

## 2024-05-06 ENCOUNTER — Other Ambulatory Visit: Payer: Self-pay

## 2024-05-06 ENCOUNTER — Encounter: Attending: Family Medicine | Admitting: Dietician

## 2024-05-06 ENCOUNTER — Encounter: Payer: Self-pay | Admitting: "Endocrinology

## 2024-05-06 ENCOUNTER — Encounter: Payer: Self-pay | Admitting: Family Medicine

## 2024-05-06 DIAGNOSIS — E1169 Type 2 diabetes mellitus with other specified complication: Secondary | ICD-10-CM | POA: Diagnosis not present

## 2024-05-06 DIAGNOSIS — E669 Obesity, unspecified: Secondary | ICD-10-CM | POA: Diagnosis not present

## 2024-05-06 DIAGNOSIS — G8929 Other chronic pain: Secondary | ICD-10-CM

## 2024-05-06 MED ORDER — TRAMADOL HCL 50 MG PO TABS: 100.0000 mg | ORAL_TABLET | Freq: Three times a day (TID) | ORAL | 0 refills | Status: DC | PRN

## 2024-05-07 ENCOUNTER — Encounter: Payer: Self-pay | Admitting: Dietician

## 2024-05-07 ENCOUNTER — Ambulatory Visit: Admitting: Family Medicine

## 2024-05-07 ENCOUNTER — Encounter: Admitting: Family Medicine

## 2024-05-07 ENCOUNTER — Encounter: Payer: Self-pay | Admitting: Family Medicine

## 2024-05-07 NOTE — Progress Notes (Signed)
 Patient was seen on 05/06/2024 for the second of a series of three diabetes self-management courses at the [facility]. The following learning objectives were met by the patient during this class:  Describe the role of different macronutrients on glucose Explain how carbohydrates affect blood glucose State what foods contain the most carbohydrates Demonstrate carbohydrate counting Demonstrate how to read Nutrition Facts food label Describe effects of various fats on heart health Describe the importance of good nutrition for health and healthy eating strategies Describe techniques for managing your shopping, cooking and meal planning List strategies to follow meal plan when dining out Describe the effects of alcohol on glucose and how to use it safely  Goals:  Follow Diabetes Meal Plan as instructed  Aim to spread carbs evenly throughout the day  Aim for 3 meals per day and snacks as needed Include lean protein foods to meals/snacks  Monitor glucose levels as instructed by your doctor   Follow-Up Plan: Attend Core 3 Work towards following your personal food plan.

## 2024-05-10 ENCOUNTER — Ambulatory Visit: Admitting: "Endocrinology

## 2024-05-13 ENCOUNTER — Ambulatory Visit

## 2024-05-17 ENCOUNTER — Ambulatory Visit: Payer: Medicare PPO | Admitting: Internal Medicine

## 2024-05-25 ENCOUNTER — Other Ambulatory Visit: Payer: Self-pay | Admitting: Family Medicine

## 2024-05-26 NOTE — Telephone Encounter (Signed)
 Rx 03/08/24 #450 3RF- too soon Requested Prescriptions  Pending Prescriptions Disp Refills   ACCU-CHEK GUIDE TEST test strip [Pharmacy Med Name: ACCU-CHEK GUIDE In Vitro Strip] 450 strip 3    Sig: USE TO CHECK BLOOD SUGARS 5 TIMES PER DAY.     There is no refill protocol information for this order

## 2024-06-03 ENCOUNTER — Other Ambulatory Visit: Payer: Self-pay | Admitting: Family Medicine

## 2024-06-03 ENCOUNTER — Ambulatory Visit: Admitting: "Endocrinology

## 2024-06-03 DIAGNOSIS — I10 Essential (primary) hypertension: Secondary | ICD-10-CM

## 2024-06-03 DIAGNOSIS — E785 Hyperlipidemia, unspecified: Secondary | ICD-10-CM

## 2024-06-04 ENCOUNTER — Other Ambulatory Visit: Payer: Self-pay

## 2024-06-04 DIAGNOSIS — E785 Hyperlipidemia, unspecified: Secondary | ICD-10-CM

## 2024-06-04 DIAGNOSIS — I10 Essential (primary) hypertension: Secondary | ICD-10-CM

## 2024-06-04 MED ORDER — LOSARTAN POTASSIUM 100 MG PO TABS: 100.0000 mg | ORAL_TABLET | Freq: Every day | ORAL | 0 refills | Status: AC

## 2024-06-04 MED ORDER — ROSUVASTATIN CALCIUM 10 MG PO TABS: 10.0000 mg | ORAL_TABLET | Freq: Every day | ORAL | 0 refills | Status: DC

## 2024-06-10 ENCOUNTER — Other Ambulatory Visit: Payer: Self-pay | Admitting: Family Medicine

## 2024-06-10 DIAGNOSIS — G8929 Other chronic pain: Secondary | ICD-10-CM

## 2024-06-11 ENCOUNTER — Ambulatory Visit

## 2024-06-11 ENCOUNTER — Encounter: Payer: Self-pay | Admitting: Family Medicine

## 2024-06-12 ENCOUNTER — Encounter: Payer: Self-pay | Admitting: Family Medicine

## 2024-06-13 ENCOUNTER — Encounter: Payer: Self-pay | Admitting: Family Medicine

## 2024-06-14 ENCOUNTER — Other Ambulatory Visit: Payer: Self-pay

## 2024-06-14 DIAGNOSIS — E119 Type 2 diabetes mellitus without complications: Secondary | ICD-10-CM

## 2024-06-14 DIAGNOSIS — E039 Hypothyroidism, unspecified: Secondary | ICD-10-CM

## 2024-06-14 DIAGNOSIS — E785 Hyperlipidemia, unspecified: Secondary | ICD-10-CM

## 2024-06-14 MED ORDER — AMPHETAMINE-DEXTROAMPHET ER 20 MG PO CP24: 20.0000 mg | ORAL_CAPSULE | ORAL | 0 refills | Status: DC

## 2024-06-15 ENCOUNTER — Other Ambulatory Visit: Payer: Self-pay

## 2024-06-15 ENCOUNTER — Ambulatory Visit

## 2024-06-15 DIAGNOSIS — Z23 Encounter for immunization: Secondary | ICD-10-CM

## 2024-06-15 MED ORDER — COVID-19 MRNA VAC-TRIS(PFIZER) 30 MCG/0.3ML IM SUSY: 0.3000 mL | PREFILLED_SYRINGE | Freq: Once | INTRAMUSCULAR | 0 refills | Status: AC

## 2024-06-17 ENCOUNTER — Other Ambulatory Visit: Payer: Self-pay | Admitting: Family Medicine

## 2024-06-17 DIAGNOSIS — N1832 Chronic kidney disease, stage 3b: Secondary | ICD-10-CM | POA: Diagnosis not present

## 2024-06-17 DIAGNOSIS — E039 Hypothyroidism, unspecified: Secondary | ICD-10-CM | POA: Diagnosis not present

## 2024-06-17 DIAGNOSIS — E119 Type 2 diabetes mellitus without complications: Secondary | ICD-10-CM | POA: Diagnosis not present

## 2024-06-17 DIAGNOSIS — E785 Hyperlipidemia, unspecified: Secondary | ICD-10-CM | POA: Diagnosis not present

## 2024-06-17 MED ORDER — ALPRAZOLAM 0.5 MG PO TABS: 0.5000 mg | ORAL_TABLET | Freq: Every evening | ORAL | 3 refills | Status: DC | PRN

## 2024-06-17 MED ORDER — ALPRAZOLAM 0.5 MG PO TABS: 0.5000 mg | ORAL_TABLET | Freq: Every evening | ORAL | 3 refills | Status: AC | PRN

## 2024-06-18 ENCOUNTER — Encounter: Payer: Self-pay | Admitting: "Endocrinology

## 2024-06-18 LAB — LAB REPORT - SCANNED
EGFR: 47
PTH: 92

## 2024-06-21 ENCOUNTER — Ambulatory Visit: Admitting: Skilled Nursing Facility1

## 2024-06-21 DIAGNOSIS — L237 Allergic contact dermatitis due to plants, except food: Secondary | ICD-10-CM | POA: Diagnosis not present

## 2024-06-22 ENCOUNTER — Encounter: Payer: Self-pay | Admitting: "Endocrinology

## 2024-06-22 ENCOUNTER — Other Ambulatory Visit (HOSPITAL_COMMUNITY): Payer: Self-pay

## 2024-06-22 ENCOUNTER — Other Ambulatory Visit: Payer: Self-pay | Admitting: "Endocrinology

## 2024-06-22 MED ORDER — METFORMIN HCL ER 500 MG PO TB24
500.0000 mg | ORAL_TABLET | Freq: Every day | ORAL | 0 refills | Status: DC
Start: 2024-06-22 — End: 2024-07-26

## 2024-06-24 ENCOUNTER — Encounter: Payer: Self-pay | Admitting: Family Medicine

## 2024-06-24 ENCOUNTER — Other Ambulatory Visit: Payer: Self-pay

## 2024-06-29 ENCOUNTER — Other Ambulatory Visit: Payer: Self-pay | Admitting: Family Medicine

## 2024-06-29 ENCOUNTER — Encounter: Payer: Self-pay | Admitting: Family Medicine

## 2024-06-29 DIAGNOSIS — E876 Hypokalemia: Secondary | ICD-10-CM | POA: Diagnosis not present

## 2024-06-29 DIAGNOSIS — R609 Edema, unspecified: Secondary | ICD-10-CM | POA: Diagnosis not present

## 2024-06-29 DIAGNOSIS — N1832 Chronic kidney disease, stage 3b: Secondary | ICD-10-CM | POA: Diagnosis not present

## 2024-06-29 DIAGNOSIS — E1122 Type 2 diabetes mellitus with diabetic chronic kidney disease: Secondary | ICD-10-CM | POA: Diagnosis not present

## 2024-06-29 DIAGNOSIS — D631 Anemia in chronic kidney disease: Secondary | ICD-10-CM | POA: Diagnosis not present

## 2024-06-29 DIAGNOSIS — I129 Hypertensive chronic kidney disease with stage 1 through stage 4 chronic kidney disease, or unspecified chronic kidney disease: Secondary | ICD-10-CM | POA: Diagnosis not present

## 2024-07-08 ENCOUNTER — Encounter: Payer: Self-pay | Admitting: Family Medicine

## 2024-07-12 ENCOUNTER — Other Ambulatory Visit: Payer: Self-pay | Admitting: Family Medicine

## 2024-07-12 DIAGNOSIS — G8929 Other chronic pain: Secondary | ICD-10-CM

## 2024-07-12 MED ORDER — TRAMADOL HCL 50 MG PO TABS: 100.0000 mg | ORAL_TABLET | Freq: Three times a day (TID) | ORAL | 0 refills | Status: DC | PRN

## 2024-07-13 ENCOUNTER — Other Ambulatory Visit: Payer: Self-pay

## 2024-07-13 DIAGNOSIS — E039 Hypothyroidism, unspecified: Secondary | ICD-10-CM

## 2024-07-13 MED ORDER — LEVOTHYROXINE SODIUM 88 MCG PO TABS: 88.0000 ug | ORAL_TABLET | Freq: Every day | ORAL | 3 refills | Status: DC

## 2024-07-15 ENCOUNTER — Other Ambulatory Visit: Payer: Self-pay

## 2024-07-15 DIAGNOSIS — G8929 Other chronic pain: Secondary | ICD-10-CM

## 2024-07-16 MED ORDER — TRAMADOL HCL 50 MG PO TABS: 100.0000 mg | ORAL_TABLET | Freq: Three times a day (TID) | ORAL | 0 refills | Status: DC | PRN

## 2024-07-19 ENCOUNTER — Encounter: Attending: Family Medicine | Admitting: Skilled Nursing Facility1

## 2024-07-19 ENCOUNTER — Encounter: Payer: Self-pay | Admitting: Skilled Nursing Facility1

## 2024-07-19 DIAGNOSIS — E119 Type 2 diabetes mellitus without complications: Secondary | ICD-10-CM | POA: Insufficient documentation

## 2024-07-19 NOTE — Progress Notes (Unsigned)
 Pt states she does have a CGM Dexcom.   DM medications: Metformin  Rybelsus   CGM: TIR 73%, 1% low, 1% very low, 20% high   Pt states for movement she does: Exercise class on her phone, walking, gardening. Pt states she has been doing fasting because she feels every time she eats no matter what it is she sees highs. Dietitian reviewed her day to day patterns and discussed what that day looked like to assist in making more informed decisions with her meals.   Dinner: beans with some chicken  Goals: Choose whole grains such as trisicuits: so for breakfast serving size of Triscuits + Gaucamole + pico + cheese stick  Naan crackers later in the day with cheese as a snack if you want  Just tell people I do not like that, I do not want it  Diabetes Self-Management Education  Visit Type: Follow-up  Appt. Start Time: 11:18 Appt. End Time: 12:13  07/19/2024  Tracy Price, identified by name and date of birth, is a 68 y.o. female with a diagnosis of Diabetes:  .   ASSESSMENT  There were no vitals taken for this visit. There is no height or weight on file to calculate BMI.   Diabetes Self-Management Education - 07/19/24 1237       Visit Information   Visit Type Follow-up      Health Coping   How would you rate your overall health? Good      Psychosocial Assessment   What is the hardest part about your diabetes right now, causing you the most concern, or is the most worrisome to you about your diabetes?   Making healty food and beverage choices    Self-management support Family    Patient Concerns Glycemic Control    Special Needs None    Preferred Learning Style Visual;Hands on    Learning Readiness Ready    How often do you need to have someone help you when you read instructions, pamphlets, or other written materials from your doctor or pharmacy? 1 - Never      Pre-Education Assessment   Patient understands the diabetes disease and treatment process. Demonstrates  understanding / competency    Patient understands incorporating nutritional management into lifestyle. Demonstrates understanding / competency    Patient undertands incorporating physical activity into lifestyle. Demonstrates understanding / competency    Patient understands using medications safely. Demonstrates understanding / competency    Patient understands monitoring blood glucose, interpreting and using results Demonstrates understanding / competency    Patient understands prevention, detection, and treatment of acute complications. Demonstrates understanding / competency    Patient understands prevention, detection, and treatment of chronic complications. Demonstrates understanding / competency    Patient understands how to develop strategies to address psychosocial issues. Demonstrates understanding / competency    Patient understands how to develop strategies to promote health/change behavior. Demonstrates understanding / competency      Complications   How often do you check your blood sugar? > 4 times/day    Fasting Blood glucose range (mg/dL) 869-820    Postprandial Blood glucose range (mg/dL) 869-820;819-799    Number of hypoglycemic episodes per month 0      Activity / Exercise   Activity / Exercise Type Light (walking / raking leaves)      Patient Education   Previous Diabetes Education Yes    Healthy Eating Role of diet in the treatment of diabetes and the relationship between the three main macronutrients and blood glucose level;Plate  Method;Meal timing in regards to the patients' current diabetes medication.;Reviewed blood glucose goals for pre and post meals and how to evaluate the patients' food intake on their blood glucose level.    Monitoring Taught/evaluated CGM (comment);Taught/discussed recording of test results and interpretation of SMBG.;Daily foot exams    Acute complications Taught prevention, symptoms, and  treatment of hypoglycemia - the 15 rule.;Discussed and  identified patients' prevention, symptoms, and treatment of hyperglycemia.    Diabetes Stress and Support Identified and addressed patients feelings and concerns about diabetes;Role of stress on diabetes      Individualized Goals (developed by patient)   Nutrition Follow meal plan discussed;General guidelines for healthy choices and portions discussed    Medications take my medication as prescribed    Problem Solving Eating Pattern      Patient Self-Evaluation of Goals - Patient rates self as meeting previously set goals (% of time)   Nutrition < 25% (hardly ever/never)    Physical Activity >75% (most of the time)    Medications >75% (most of the time)    Monitoring >75% (most of the time)    Problem Solving and behavior change strategies  25 - 50% (sometimes)    Reducing Risk (treating acute and chronic complications) >75% (most of the time)    Health Coping 50 - 75 % (half of the time)      Post-Education Assessment   Patient understands the diabetes disease and treatment process. Demonstrates understanding / competency    Patient understands incorporating nutritional management into lifestyle. Demonstrates understanding / competency    Patient undertands incorporating physical activity into lifestyle. Demonstrates understanding / competency    Patient understands using medications safely. Demonstrates understanding / competency    Patient understands monitoring blood glucose, interpreting and using results Demonstrates understanding / competency    Patient understands prevention, detection, and treatment of acute complications. Demonstrates understanding / competency    Patient understands prevention, detection, and treatment of chronic complications. Demonstrates understanding / competency    Patient understands how to develop strategies to address psychosocial issues. Demonstrates understanding / competency    Patient understands how to develop strategies to promote health/change  behavior. Demonstrates understanding / competency      Outcomes   Expected Outcomes Demonstrated interest in learning. Expect positive outcomes    Future DMSE 3-4 months    Program Status Completed      Subsequent Visit   Since your last visit have you continued or begun to take your medications as prescribed? Yes          Individualized Plan for Diabetes Self-Management Training:   Learning Objective:  Patient will have a greater understanding of diabetes self-management. Patient education plan is to attend individual and/or group sessions per assessed needs and concerns.   Plan:   There are no Patient Instructions on file for this visit.  Expected Outcomes:  Demonstrated interest in learning. Expect positive outcomes  Education material provided: {CHL AMB DSME EDUCATION MATERIAL:22611}  If problems or questions, patient to contact team via:  {:[phone]}  Future DSME appointment: 3-4 months

## 2024-07-25 ENCOUNTER — Encounter: Payer: Self-pay | Admitting: "Endocrinology

## 2024-07-25 DIAGNOSIS — E1140 Type 2 diabetes mellitus with diabetic neuropathy, unspecified: Secondary | ICD-10-CM

## 2024-07-26 ENCOUNTER — Telehealth: Payer: Self-pay

## 2024-07-26 ENCOUNTER — Other Ambulatory Visit: Payer: Self-pay | Admitting: Endocrinology

## 2024-07-26 ENCOUNTER — Other Ambulatory Visit: Payer: Self-pay

## 2024-07-26 DIAGNOSIS — E1140 Type 2 diabetes mellitus with diabetic neuropathy, unspecified: Secondary | ICD-10-CM

## 2024-07-26 MED ORDER — METFORMIN HCL ER 500 MG PO TB24: 1000.0000 mg | ORAL_TABLET | Freq: Every day | ORAL | 0 refills | Status: DC

## 2024-07-26 MED ORDER — METFORMIN HCL ER 500 MG PO TB24
1000.0000 mg | ORAL_TABLET | Freq: Every day | ORAL | 0 refills | Status: DC
Start: 2024-07-26 — End: 2024-07-26

## 2024-07-26 NOTE — Telephone Encounter (Signed)
 Sent prescription for metformin  extended release 500 mg 2 tablets daily.  90-day supply.

## 2024-07-26 NOTE — Telephone Encounter (Signed)
 Prescription Request  07/26/2024  LOV: 12/18/23  What is the name of the medication or equipment? Accu-Chek Guide Glucose Meter  Have you contacted your pharmacy to request a refill? Yes   Which pharmacy would you like this sent to?  [facility] - [place], [place] - [address] [place] [address] Phone: [phone] Fax: [phone]    Patient notified that their request is being sent to the clinical staff for review and that they should receive a response within 2 business days.   Please advise at [facility] [phone]

## 2024-07-27 ENCOUNTER — Other Ambulatory Visit: Payer: Self-pay

## 2024-07-27 DIAGNOSIS — E119 Type 2 diabetes mellitus without complications: Secondary | ICD-10-CM

## 2024-07-27 MED ORDER — ACCU-CHEK GUIDE TEST VI STRP: ORAL_STRIP | 3 refills | Status: DC

## 2024-07-31 DIAGNOSIS — E109 Type 1 diabetes mellitus without complications: Secondary | ICD-10-CM | POA: Diagnosis not present

## 2024-08-05 ENCOUNTER — Other Ambulatory Visit: Payer: Self-pay | Admitting: Medical Genetics

## 2024-08-05 DIAGNOSIS — Z006 Encounter for examination for normal comparison and control in clinical research program: Secondary | ICD-10-CM

## 2024-08-11 ENCOUNTER — Encounter: Payer: Self-pay | Admitting: Family Medicine

## 2024-08-11 ENCOUNTER — Ambulatory Visit: Admitting: "Endocrinology

## 2024-08-12 ENCOUNTER — Other Ambulatory Visit: Payer: Self-pay

## 2024-08-12 ENCOUNTER — Other Ambulatory Visit: Payer: Self-pay | Admitting: Family Medicine

## 2024-08-12 DIAGNOSIS — G8929 Other chronic pain: Secondary | ICD-10-CM

## 2024-08-12 DIAGNOSIS — E785 Hyperlipidemia, unspecified: Secondary | ICD-10-CM

## 2024-08-12 LAB — OPHTHALMOLOGY REPORT-SCANNED

## 2024-08-13 MED ORDER — TRAMADOL HCL 50 MG PO TABS: 100.0000 mg | ORAL_TABLET | Freq: Three times a day (TID) | ORAL | 0 refills | Status: DC | PRN

## 2024-08-16 ENCOUNTER — Encounter: Payer: Self-pay | Admitting: Family Medicine

## 2024-08-16 ENCOUNTER — Other Ambulatory Visit: Payer: Self-pay | Admitting: Family Medicine

## 2024-08-16 DIAGNOSIS — Z1231 Encounter for screening mammogram for malignant neoplasm of breast: Secondary | ICD-10-CM

## 2024-08-18 ENCOUNTER — Encounter: Payer: Self-pay | Admitting: "Endocrinology

## 2024-08-18 ENCOUNTER — Encounter: Payer: Self-pay | Admitting: Family Medicine

## 2024-08-18 ENCOUNTER — Ambulatory Visit: Admitting: "Endocrinology

## 2024-08-18 VITALS — BP 114/80 | Ht 59.0 in | Wt 123.0 lb

## 2024-08-18 DIAGNOSIS — E1140 Type 2 diabetes mellitus with diabetic neuropathy, unspecified: Secondary | ICD-10-CM | POA: Diagnosis not present

## 2024-08-18 DIAGNOSIS — Z7984 Long term (current) use of oral hypoglycemic drugs: Secondary | ICD-10-CM | POA: Diagnosis not present

## 2024-08-18 DIAGNOSIS — E7800 Pure hypercholesterolemia, unspecified: Secondary | ICD-10-CM

## 2024-08-18 LAB — POCT GLYCOSYLATED HEMOGLOBIN (HGB A1C): Hemoglobin A1C: 5.5 % (ref 4.0–5.6)

## 2024-08-18 NOTE — Patient Instructions (Signed)

## 2024-08-18 NOTE — Progress Notes (Signed)
 Outpatient Endocrinology Note Tracy Birmingham, MD  08/18/24   Tracy Price 08-Sep-1956 [id number]  Referring Provider: Duanne Butler DASEN, MD Primary Care Provider: Duanne Butler DASEN, MD Reason for consultation: Subjective   Assessment & Plan  Diagnoses and all orders for this visit:  Type 2 diabetes mellitus with diabetic neuropathy, without long-term current use of insulin  (HCC) -     POCT glycosylated hemoglobin (Hb A1C)  Long term (current) use of oral hypoglycemic drugs  Pure hypercholesterolemia   History of diabetes Type II complicated by +  neuropathy, No results found for: GFR Hba1c goal less than 7, current Hba1c is  Lab Results  Component Value Date   HGBA1C 5.5 08/18/2024   Will recommend the following: 03/12/24 EGFR 25, watch kidney function since rybelsus  can do rapid decline in kidney function per some reports per FDA so monitoring is required  Rybelsus  14 mg every day Glipizide  2.5mg  as needed for blood sugar more than BG>250  Stopped Jardiance  because of several UTIs Taken on and off of diabetes medication by PCP, A1C has dipped to 4s and weight loss to 95 lbs on mounjaro  Self started old januvia  pills after last DM medications stopped by PCP  Worries about heart and stroke and ongoing postprandial hyperglycemia   Was on mounjaro  10 mg/week, was taken off in 09/2023 due to wt loss up to 95 lbs  Has a urine infection every month with E.Coli  Tried jardiance  in past for 1 mo Reports metformin  and Actos did not work   No known contraindications/side effects to any of above medications  -Last LD and Tg are as follows: Lab Results  Component Value Date   LDLCALC 45 09/01/2023    Lab Results  Component Value Date   TRIG 76 09/01/2023   -On rosuvastatin  10 mg QD -Follow low fat diet and exercise   -Blood pressure goal <140/90 - Microalbumin/creatinine goal is < 30 -Last MA/Cr is as follows: Lab Results  Component Value Date   MICROALBUR  0.5 03/29/2022   -on ACE/ARB losartan  100mg  every day, 03/12/24 EGFR 25, sees a nephrologist  -diet changes including salt restriction -limit eating outside -counseled BP targets per standards of diabetes care -uncontrolled blood pressure can lead to retinopathy, nephropathy and cardiovascular and atherosclerotic heart disease  Reviewed and counseled on: -A1C target -Blood sugar targets -Complications of uncontrolled diabetes  -Checking blood sugar before meals and bedtime and bring log next visit -All medications with mechanism of action and side effects -Hypoglycemia management: rule of 15's, Glucagon Emergency Kit and medical alert ID -low-carb low-fat plate-method diet -At least 20 minutes of physical activity per day -Annual dilated retinal eye exam and foot exam -compliance and follow up needs -follow up as scheduled or earlier if problem gets worse  Call if blood sugar is less than 70 or consistently above 250    Take a 15 gm snack of carbohydrate at bedtime before you go to sleep if your blood sugar is less than 100.    If you are going to fast after midnight for a test or procedure, ask your physician for instructions on how to reduce/decrease your insulin  dose.    Call if blood sugar is less than 70 or consistently above 250  -Treating a low sugar by rule of 15  (15 gms of sugar every 15 min until sugar is more than 70) If you feel your sugar is low, test your sugar to be sure If your sugar is low (  less than 70), then take 15 grams of a fast acting Carbohydrate (3-4 glucose tablets or glucose gel or 4 ounces of juice or regular soda) Recheck your sugar 15 min after treating low to make sure it is more than 70 If sugar is still less than 70, treat again with 15 grams of carbohydrate          Don't drive the hour of hypoglycemia  If unconscious/unable to eat or drink by mouth, use glucagon injection or nasal spray baqsimi and call 911. Can repeat again in 15 min if still  unconscious.  Return in about 4 months (around 12/16/2024).  I have reviewed current medications, nurse's notes, allergies, vital signs, past medical and surgical history, family medical history, and social history for this encounter. Counseled patient on symptoms, examination findings, lab findings, imaging results, treatment decisions and monitoring and prognosis. The patient understood the recommendations and agrees with the treatment plan. All questions regarding treatment plan were fully answered.  Tracy Birmingham, MD  08/18/24    History of Present Illness Tracy Price is a 68 y.o. year old female who presents for follow up of Type II diabetes mellitus.  Tracy Price was first diagnosed around 2010.   Diabetes education +  Home diabetes regimen: Rybelsus  14 mg every day Glipizide  2.5mg  as needed for blood sugar more than BG>250  Was on mounjaro  10 mg/week, was taken off in 09/2023 due to wt loss up to 95 lbs  Has a urine infection every month with E.Coli  Tried jardiance  in past for 1 mo Reports metformin  and Actos did not work   COMPLICATIONS -  MI/Stroke -  retinopathy +  neuropathy + nephropathy  BLOOD SUGAR DATA CGM interpretation: At today's visit, we reviewed her CGM downloads. The full report is scanned in the media. Reviewing the CGM trends, BG are well controlled with some highs after meals.  Physical Exam  BP 114/80   Ht 4' 11 (1.499 m)   Wt 123 lb (55.8 kg)   BMI 24.84 kg/m    Constitutional: well developed, well nourished Head: normocephalic, atraumatic Eyes: sclera anicteric, no redness Neck: supple Lungs: normal respiratory effort Neurology: alert and oriented Skin: dry, no appreciable rashes Musculoskeletal: no appreciable defects Psychiatric: normal mood and affect Diabetic Foot Exam - Simple   No data filed      Current Medications Patient's Medications  New Prescriptions   No medications on file  Previous Medications    ALPRAZOLAM  (XANAX ) 0.5 MG TABLET    Take 1 tablet (0.5 mg total) by mouth at bedtime as needed for anxiety (90 day supply).   AMPHETAMINE -DEXTROAMPHETAMINE (ADDERALL XR) 20 MG 24 HR CAPSULE    Take 1 capsule (20 mg total) by mouth every morning.   BLOOD GLUCOSE METER KIT AND SUPPLIES    Dispense based on patient and insurance preference. Use up to four times daily as directed. (FOR ICD-10 E10.9, E11.9). Pt requests: ACCU-CHEK Guide Meter made by Roche   DESONIDE (DESOWEN) 0.05 % CREAM    SMARTSIG:sparingly Topical Twice Daily   FERROUS SULFATE 325 (65 FE) MG TABLET       GLIPIZIDE  2.5 MG TABS    Take 1 tablet by mouth 2 (two) times daily as needed (for blood sugar more than 200).   GLUCOSE BLOOD (ACCU-CHEK GUIDE TEST) TEST STRIP    USE TO CHECK BLOOD SUGARS 5 TIMES PER DAY.   GLUCOSE BLOOD (ACCU-CHEK GUIDE) TEST STRIP    USE TO CHECK BLOOD  SUGARS 5 TIMES PER DAY.   HYDROXYZINE (ATARAX) 25 MG TABLET    Take 25 mg by mouth every 6 (six) hours as needed.   LEVOTHYROXINE  (SYNTHROID ) 88 MCG TABLET    Take 1 tablet (88 mcg total) by mouth daily.   LOSARTAN  (COZAAR ) 100 MG TABLET    Take 1 tablet (100 mg total) by mouth daily.   MAGNESIUM 500 MG CAPS       MELATONIN 5 MG TABS    Take by mouth.   METFORMIN  (GLUCOPHAGE -XR) 500 MG 24 HR TABLET    Take 2 tablets (1,000 mg total) by mouth daily with breakfast.   PREDNISONE  (DELTASONE ) 10 MG TABLET    Take 10 mg by mouth as needed.   ROSUVASTATIN  (CRESTOR ) 10 MG TABLET    TAKE 1 TABLET(10 MG) BY MOUTH DAILY   RYBELSUS  14 MG TABS    TAKE 1 TABLET(14 MG) BY MOUTH DAILY   TRAMADOL  (ULTRAM ) 50 MG TABLET    Take 2 tablets (100 mg total) by mouth 3 (three) times daily as needed.  Modified Medications   No medications on file  Discontinued Medications   No medications on file    Allergies Allergies  Allergen Reactions   Antivenin, Funnel Web Spider [Antivenin, Atrax Robustus]    Poison Ivy Extract Rash    Past Medical History Past Medical History:   Diagnosis Date   ADD (attention deficit disorder)    Allergy    Anemia    Anxiety 2018   Chest pain    CKD (chronic kidney disease) stage 3, GFR 30-59 ml/min (HCC)    Depression 7 years ago   Had a brain injury   Diabetes mellitus without complication (HCC)    type 2   GERD (gastroesophageal reflux disease)    Hyperlipidemia    Hypertension    Hypothyroidism    Osteoporosis    Sleep apnea 1995   had surgery to correct   Subdural hematoma [facility])     Past Surgical History Past Surgical History:  Procedure Laterality Date   BRAIN SURGERY  2018   COLONOSCOPY     CRANIOTOMY Left 03/05/2017   Procedure: LEFT BURR HOLE  HEMATOMA EVACUATION SUBDURAL;  Surgeon: Onetha Kuba, MD;  Location: [facility] OR;  Service: Neurosurgery;  Laterality: Left;   FRACTURE SURGERY  1997   Left arm-pins installed to hold bones together   GASTRIC BYPASS  2005   orif arm     SMALL INTESTINE SURGERY  2000   Gastric bypass   TONSILLECTOMY      Family History family history includes Arthritis in her mother; COPD in her brother; Cancer in her father and mother; Colon cancer in her father; Colon polyps in her brother; Depression in her sister; Diabetes (age of onset: 41) in her sister; Early death in her sister; Hearing loss in her brother and sister; Heart disease in her brother and mother; Kidney disease in her sister; Obesity in her sister; Stroke in her brother, father, and mother.  Social History Social History   Socioeconomic History   Marital status: Single    Spouse name: Not on file   Number of children: Not on file   Years of education: Not on file   Highest education level: Bachelor's degree (e.g., BA, AB, BS)  Occupational History   Not on file  Tobacco Use   Smoking status: Never   Smokeless tobacco: Never   Tobacco comments:    None  Vaping Use   Vaping status:  Never Used  Substance and Sexual Activity   Alcohol use: No   Drug use: No   Sexual activity: Not Currently  Other Topics  Concern   Not on file  Social History Narrative   Not on file   Social Drivers of Health   Financial Resource Strain: Medium Risk (08/16/2024)   Overall Financial Resource Strain (CARDIA)    Difficulty of Paying Living Expenses: Somewhat hard  Food Insecurity: Food Insecurity Present (08/16/2024)   Hunger Vital Sign    Worried About Running Out of Food in the Last Year: Sometimes true    Ran Out of Food in the Last Year: Sometimes true  Transportation Needs: No Transportation Needs (08/16/2024)   PRAPARE - Administrator, Civil Service (Medical): No    Lack of Transportation (Non-Medical): No  Physical Activity: Insufficiently Active (08/16/2024)   Exercise Vital Sign    Days of Exercise per Week: 2 days    Minutes of Exercise per Session: 30 min  Stress: Stress Concern Present (08/16/2024)   [employer] of Occupational Health - Occupational Stress Questionnaire    Feeling of Stress: To some extent  Social Connections: Moderately Isolated (08/16/2024)   Social Connection and Isolation Panel    Frequency of Communication with Friends and Family: More than three times a week    Frequency of Social Gatherings with Friends and Family: Twice a week    Attends Religious Services: Patient declined    Active Member of Clubs or Organizations: Yes    Attends Banker Meetings: Patient declined    Marital Status: Never married  Intimate Partner Violence: Not At Risk (05/05/2024)   Humiliation, Afraid, Rape, and Kick questionnaire    Fear of Current or Ex-Partner: No    Emotionally Abused: No    Physically Abused: No    Sexually Abused: No    Lab Results  Component Value Date   HGBA1C 5.5 08/18/2024   HGBA1C 5.3 03/18/2024   HGBA1C 6.2 (H) 09/01/2023   Lab Results  Component Value Date   CHOL 141 09/01/2023   Lab Results  Component Value Date   HDL 80 09/01/2023   Lab Results  Component Value Date   LDLCALC 45 09/01/2023   Lab Results   Component Value Date   TRIG 76 09/01/2023   Lab Results  Component Value Date   CHOLHDL 1.8 09/01/2023   Lab Results  Component Value Date   CREATININE 1.98 (H) 10/27/2023   No results found for: GFR Lab Results  Component Value Date   MICROALBUR 0.5 03/29/2022      Component Value Date/Time   NA 139 10/27/2023 1439   K 3.6 10/27/2023 1439   CL 95 (L) 10/27/2023 1439   CO2 32 10/27/2023 1439   GLUCOSE 141 (H) 10/27/2023 1439   BUN 42 (H) 10/27/2023 1439   CREATININE 1.98 (H) 10/27/2023 1439   CALCIUM  9.1 03/12/2024 0000   PROT 6.6 10/06/2023 1602   ALBUMIN 4.1 03/27/2017 1650   AST 48 (H) 10/06/2023 1602   ALT 55 (H) 10/06/2023 1602   ALKPHOS 134 (H) 03/27/2017 1650   BILITOT 0.8 10/06/2023 1602   GFRNONAA 42 (L) 04/12/2021 1106   GFRAA 48 (L) 04/12/2021 1106      03/12/2024   12:00 AM 12/24/2023   12:00 AM 12/05/2023   12:00 AM  BMP  Calcium  9.1  8.3     9.4         This result is from an  external source.       Component Value Date/Time   WBC 7.6 09/01/2023 1041   RBC 3.61 (L) 09/01/2023 1041   HGB 11.8 09/01/2023 1041   HCT 35.6 09/01/2023 1041   PLT 174 09/01/2023 1041   MCV 98.6 09/01/2023 1041   MCH 32.7 09/01/2023 1041   MCHC 33.1 09/01/2023 1041   RDW 12.1 09/01/2023 1041   LYMPHSABS 1,540 05/29/2023 1034   MONOABS 540 03/27/2017 1650   EOSABS 578 (H) 09/01/2023 1041   BASOSABS 68 09/01/2023 1041     Parts of this note may have been dictated using voice recognition software. There may be variances in spelling and vocabulary which are unintentional. Not all errors are proofread. Please notify the dino if any discrepancies are noted or if the meaning of any statement is not clear.

## 2024-08-19 ENCOUNTER — Encounter: Payer: Self-pay | Admitting: Family Medicine

## 2024-08-19 ENCOUNTER — Ambulatory Visit
Admission: RE | Admit: 2024-08-19 | Discharge: 2024-08-19 | Disposition: A | Discharge status: Home / Self Care | Source: Ambulatory Visit | Attending: Family Medicine | Admitting: Family Medicine

## 2024-08-19 ENCOUNTER — Ambulatory Visit: Admitting: Dietician

## 2024-08-19 ENCOUNTER — Other Ambulatory Visit: Payer: Self-pay

## 2024-08-19 ENCOUNTER — Ambulatory Visit (INDEPENDENT_AMBULATORY_CARE_PROVIDER_SITE_OTHER): Admitting: Family Medicine

## 2024-08-19 VITALS — BP 130/70 | HR 88 | Temp 97.7°F | Ht 59.0 in | Wt 128.0 lb

## 2024-08-19 DIAGNOSIS — E039 Hypothyroidism, unspecified: Secondary | ICD-10-CM

## 2024-08-19 DIAGNOSIS — M65332 Trigger finger, left middle finger: Secondary | ICD-10-CM

## 2024-08-19 DIAGNOSIS — Z1231 Encounter for screening mammogram for malignant neoplasm of breast: Secondary | ICD-10-CM | POA: Diagnosis not present

## 2024-08-19 MED ORDER — TRIAMCINOLONE ACETONIDE 40 MG/ML IJ SUSP
40.0000 mg | Freq: Once | INTRAMUSCULAR | Status: AC
  Administered 2024-08-19: 40 mg via INTRA_ARTICULAR

## 2024-08-19 NOTE — Addendum Note (Signed)
 Addended by: ANGELENA RONAL BRADLEY K on: 08/19/2024 11:20 AM   Modules accepted: Orders

## 2024-08-19 NOTE — Progress Notes (Signed)
 Subjective:    Patient ID: Tracy Price, female    DOB: 1956-01-29, 68 y.o.   MRN: [id number]  Patient complains of a trigger finger in her left hand.  At the third MCP joint, the patient has pain and locking whenever she flexes her MCP joint and PIP joint.  She will feel a popping sensation or a locking sensation on the palmar aspect of the third MCP joint.  It is very tender.  It feels similar to what she experienced when she had a trigger finger in her other hand.  She is requesting a cortisone injection to alleviate the discomfort.  Past Medical History:  Diagnosis Date   ADD (attention deficit disorder)    Allergy    Anemia    Anxiety 2018   Chest pain    CKD (chronic kidney disease) stage 3, GFR 30-59 ml/min (HCC)    Depression 7 years ago   Had a brain injury   Diabetes mellitus without complication (HCC)    type 2   GERD (gastroesophageal reflux disease)    Hyperlipidemia    Hypertension    Hypothyroidism    Osteoporosis    Sleep apnea 1995   had surgery to correct   Subdural hematoma [facility])     Past Surgical History:  Procedure Laterality Date   BRAIN SURGERY  2018   COLONOSCOPY     CRANIOTOMY Left 03/05/2017   Procedure: LEFT BURR HOLE  HEMATOMA EVACUATION SUBDURAL;  Surgeon: Onetha Kuba, MD;  Location: [facility] OR;  Service: Neurosurgery;  Laterality: Left;   FRACTURE SURGERY  1997   Left arm-pins installed to hold bones together   GASTRIC BYPASS  2005   orif arm     SMALL INTESTINE SURGERY  2000   Gastric bypass   TONSILLECTOMY     Current Outpatient Medications on File Prior to Visit  Medication Sig Dispense Refill   ALPRAZolam  (XANAX ) 0.5 MG tablet Take 1 tablet (0.5 mg total) by mouth at bedtime as needed for anxiety (90 day supply). 90 tablet 3   amphetamine -dextroamphetamine (ADDERALL XR) 20 MG 24 hr capsule Take 1 capsule (20 mg total) by mouth every morning. 90 capsule 0   blood glucose meter kit and supplies Dispense based on patient and insurance  preference. Use up to four times daily as directed. (FOR ICD-10 E10.9, E11.9). Pt requests: ACCU-CHEK Guide Meter made by Roche 1 each 0   desonide (DESOWEN) 0.05 % cream SMARTSIG:sparingly Topical Twice Daily     ferrous sulfate 325 (65 FE) MG tablet      glipiZIDE  2.5 MG TABS Take 1 tablet by mouth 2 (two) times daily as needed (for blood sugar more than 200). (Patient not taking: Reported on 05/05/2024) 60 tablet 3   glucose blood (ACCU-CHEK GUIDE TEST) test strip USE TO CHECK BLOOD SUGARS 5 TIMES PER DAY. 450 strip 3   glucose blood (ACCU-CHEK GUIDE) test strip USE TO CHECK BLOOD SUGARS 5 TIMES PER DAY. 300 each 2   hydrOXYzine (ATARAX) 25 MG tablet Take 25 mg by mouth every 6 (six) hours as needed.     levothyroxine  (SYNTHROID ) 88 MCG tablet Take 1 tablet (88 mcg total) by mouth daily. 90 tablet 3   losartan  (COZAAR ) 100 MG tablet Take 1 tablet (100 mg total) by mouth daily. 90 tablet 0   Magnesium 500 MG CAPS      Melatonin 5 MG TABS Take by mouth.     metFORMIN  (GLUCOPHAGE -XR) 500 MG 24 hr tablet Take  2 tablets (1,000 mg total) by mouth daily with breakfast. 180 tablet 0   predniSONE  (DELTASONE ) 10 MG tablet Take 10 mg by mouth as needed.     rosuvastatin  (CRESTOR ) 10 MG tablet TAKE 1 TABLET(10 MG) BY MOUTH DAILY 90 tablet 0   RYBELSUS  14 MG TABS TAKE 1 TABLET(14 MG) BY MOUTH DAILY 90 tablet 1   traMADol  (ULTRAM ) 50 MG tablet Take 2 tablets (100 mg total) by mouth 3 (three) times daily as needed. 180 tablet 0   No current facility-administered medications on file prior to visit.   Allergies  Allergen Reactions   Antivenin, Funnel Web Spider [Antivenin, Atrax Robustus]    Poison Ivy Extract Rash   Social History   Socioeconomic History   Marital status: Single    Spouse name: Not on file   Number of children: Not on file   Years of education: Not on file   Highest education level: Bachelor's degree (e.g., BA, AB, BS)  Occupational History   Not on file  Tobacco Use   Smoking  status: Never   Smokeless tobacco: Never   Tobacco comments:    None  Vaping Use   Vaping status: Never Used  Substance and Sexual Activity   Alcohol use: No   Drug use: No   Sexual activity: Not Currently  Other Topics Concern   Not on file  Social History Narrative   Not on file   Social Drivers of Health   Financial Resource Strain: Medium Risk (08/16/2024)   Overall Financial Resource Strain (CARDIA)    Difficulty of Paying Living Expenses: Somewhat hard  Food Insecurity: Food Insecurity Present (08/16/2024)   Hunger Vital Sign    Worried About Running Out of Food in the Last Year: Sometimes true    Ran Out of Food in the Last Year: Sometimes true  Transportation Needs: No Transportation Needs (08/16/2024)   PRAPARE - Administrator, Civil Service (Medical): No    Lack of Transportation (Non-Medical): No  Physical Activity: Insufficiently Active (08/16/2024)   Exercise Vital Sign    Days of Exercise per Week: 2 days    Minutes of Exercise per Session: 30 min  Stress: Stress Concern Present (08/16/2024)   [employer] of Occupational Health - Occupational Stress Questionnaire    Feeling of Stress: To some extent  Social Connections: Moderately Isolated (08/16/2024)   Social Connection and Isolation Panel    Frequency of Communication with Friends and Family: More than three times a week    Frequency of Social Gatherings with Friends and Family: Twice a week    Attends Religious Services: Patient declined    Database Administrator or Organizations: Yes    Attends Banker Meetings: Patient declined    Marital Status: Never married  Intimate Partner Violence: Not At Risk (05/05/2024)   Humiliation, Afraid, Rape, and Kick questionnaire    Fear of Current or Ex-Partner: No    Emotionally Abused: No    Physically Abused: No    Sexually Abused: No      Review of Systems  Skin:  Positive for rash.       Objective:   Physical  Exam Vitals reviewed.  Constitutional:      Appearance: Normal appearance. She is normal weight.  Cardiovascular:     Rate and Rhythm: Normal rate and regular rhythm.     Heart sounds: Normal heart sounds. No murmur heard.    No gallop.  Pulmonary:  Effort: Pulmonary effort is normal. No respiratory distress.     Breath sounds: Normal breath sounds. No stridor. No wheezing, rhonchi or rales.  Musculoskeletal:       Hands:  Neurological:     Mental Status: She is alert.           Assessment & Plan:   Trigger middle finger of left hand Using sterile technique, I injected with 1/2 cc of 0.1% lidocaine  and 1/2 cc of 40 mg/mL Kenalog  adjacent to the flexor tendon on the palmar surface of the third MCP joint.  The patient tolerated the procedure well without complication.  Hopefully this will give her some relief.

## 2024-08-19 NOTE — Telephone Encounter (Signed)
 Per Dr. Clara approval referral placed to [facility] were pt already has care established.

## 2024-08-20 ENCOUNTER — Encounter: Payer: Self-pay | Admitting: "Endocrinology

## 2024-08-20 ENCOUNTER — Other Ambulatory Visit: Payer: Self-pay | Admitting: Family Medicine

## 2024-08-20 DIAGNOSIS — E039 Hypothyroidism, unspecified: Secondary | ICD-10-CM

## 2024-08-20 NOTE — Telephone Encounter (Signed)
 Prescription Request  08/20/2024  LOV: 08/19/24  What is the name of the medication or equipment? levothyroxine  (SYNTHROID ) 88 MCG tablet [[id number]]   Have you contacted your pharmacy to request a refill? Yes   Which pharmacy would you like this sent to?  [facility] - [place], [place] - [address] [place] [address] Phone: [phone] Fax: [phone]    Patient notified that their request is being sent to the clinical staff for review and that they should receive a response within 2 business days.   Please advise at [facility] [phone]

## 2024-08-22 NOTE — Telephone Encounter (Signed)
 Requested medications are due for refill today.  No - pt wants medication sent to a different pharmacy  Requested medications are on the active medications list.  yes  Last refill. 07/13/2024 #90 3 rf  Future visit scheduled.   no  Notes to clinic.  Abnormal labs.     Requested Prescriptions  Pending Prescriptions Disp Refills   levothyroxine  (SYNTHROID ) 88 MCG tablet 90 tablet 3    Sig: Take 1 tablet (88 mcg total) by mouth daily.     Endocrinology:  Hypothyroid Agents Failed - 08/22/2024  1:30 PM      Failed - TSH in normal range and within 360 days    TSH  Date Value Ref Range Status  12/18/2023 6.47 (H) 0.40 - 4.50 mIU/L Final         Passed - Valid encounter within last 12 months    Recent Outpatient Visits           3 days ago Trigger middle finger of left hand   [place] [facility] Duanne, Butler DASEN, MD   8 months ago Screen for colon cancer   [place] [facility] Duanne Butler DASEN, MD   10 months ago Hypothyroidism (acquired)   [place] [facility] Duanne Butler DASEN, MD   1 year ago Unintentional weight loss   [place] [facility] Duanne Butler DASEN, MD   1 year ago Unintentional weight loss   [place] [facility] Pickard, Butler DASEN, MD

## 2024-08-23 ENCOUNTER — Other Ambulatory Visit: Payer: Self-pay | Admitting: Family Medicine

## 2024-08-23 MED ORDER — ONDANSETRON HCL 4 MG PO TABS: 4.0000 mg | ORAL_TABLET | Freq: Three times a day (TID) | ORAL | 3 refills | Status: DC | PRN

## 2024-08-26 ENCOUNTER — Encounter: Payer: Self-pay | Admitting: Gastroenterology

## 2024-08-27 MED ORDER — LEVOTHYROXINE SODIUM 88 MCG PO TABS: 88.0000 ug | ORAL_TABLET | Freq: Every day | ORAL | 3 refills | Status: AC

## 2024-09-03 ENCOUNTER — Other Ambulatory Visit: Payer: Self-pay | Admitting: Family Medicine

## 2024-09-03 ENCOUNTER — Encounter: Payer: Self-pay | Admitting: Family Medicine

## 2024-09-03 DIAGNOSIS — E785 Hyperlipidemia, unspecified: Secondary | ICD-10-CM

## 2024-09-06 ENCOUNTER — Other Ambulatory Visit: Payer: Self-pay

## 2024-09-06 DIAGNOSIS — G8929 Other chronic pain: Secondary | ICD-10-CM

## 2024-09-07 MED ORDER — TRAMADOL HCL 50 MG PO TABS: 100.0000 mg | ORAL_TABLET | Freq: Three times a day (TID) | ORAL | 0 refills | Status: DC | PRN

## 2024-09-08 ENCOUNTER — Encounter: Payer: Self-pay | Admitting: Family Medicine

## 2024-09-14 ENCOUNTER — Encounter: Payer: Self-pay | Admitting: Family Medicine

## 2024-09-15 ENCOUNTER — Other Ambulatory Visit: Payer: Self-pay | Admitting: Family Medicine

## 2024-09-15 MED ORDER — AMPHETAMINE-DEXTROAMPHET ER 20 MG PO CP24: 20.0000 mg | ORAL_CAPSULE | ORAL | 0 refills | Status: AC

## 2024-09-20 ENCOUNTER — Other Ambulatory Visit: Payer: Self-pay

## 2024-09-20 ENCOUNTER — Encounter: Payer: Self-pay | Admitting: Dietician

## 2024-09-20 ENCOUNTER — Encounter: Admitting: Dietician

## 2024-09-20 ENCOUNTER — Ambulatory Visit: Admitting: Dietician

## 2024-09-20 DIAGNOSIS — E119 Type 2 diabetes mellitus without complications: Secondary | ICD-10-CM

## 2024-09-20 MED ORDER — BRIMONIDINE TARTRATE 0.33 % EX GEL
CUTANEOUS | 0 refills | Status: AC
  Filled 2024-09-20: qty 90, 90d supply, fill #0

## 2024-09-20 NOTE — Patient Instructions (Addendum)
 Snack or light meal ideas:  Apple and peanut butter or nuts Protein cereal, berries, lactaid milk Chicken or tuna salad with crackers, raw vegetables Salad with chicken or tuna or cheese Cheese or peanut butter toast, fresh fruit Toast with avocado, cheese, pico, optional egg Bean burrito, fresh fruit  Avoid skipping meals Find ways to add fruits and vegetables.

## 2024-09-20 NOTE — Progress Notes (Signed)
 Appointment start:  1120 Appointment End:  1200  Patient attended Diabetes Core Classes 1-3 between 04/29/2024 and 07/19/2024 at [facility]. The purpose of the meeting today is to review information learned during those classes as well as review patient application and goals.   What are one or two positive things that you are doing right now to manage your diabetes?   She is losing weight.   She is taking her medications Exercising 5 days per week  What is the hardest part about your diabetes right now, causing you the most concern, or is the most worrisome to you about your diabetes?  Eating balanced meals.  Lack of variety as she lives alone and does not like to cook.  She has a low appetite.    What questions do you have today?  How to I tell people that I just want small portions?  Have you participated in any diabetes support group?  None.  Will add her to the class.  History:  Type 2 Diabetes, Gastric Bypass Surgery A1C:  5.5% 08/18/2024 Medications include:  Metformin , Rybelsus , iron, synthroid , magnesium Sleep:  fair due to recent death in her family Weight:   117 lbs 09/20/2024 130 lbs 04/29/2024 95 lbs 08/2024 because of [place]  States that she feels best at 110 lbs.  Discussed that she should not be CGM: Dexcom G7 CGM Results from download: 09/20/2024  % Time CGM active:   93 %   (Goal >70%)  Average glucose:   88 mg/dL for 14 days  Glucose management indicator:   5.4 %  Time in range (70-180 mg/dL):   77 %   (Goal >29%)  Time High (181-250 mg/dL):   2 %   (Goal < 74%)  Time Very High (>250 mg/dL):    0 %   (Goal < 5%)  Time Low (54-69 mg/dL):   20 %   (Goal <5%)  Time Very Low (<54 mg/dL):   0 %   (Goal <8%)  %CV (glucose variability)    31 %  (Goal <36%)   Social History:  Patient lives alone.  She doesn't cook.  She is retired audiological scientist.  She now works during conservation officer, historic buildings. Exercise:  walks every other day, free weights  senior weight class twice per week (computer)  24 hour diet recall: Breakfast:  none Snack:  non Lunch:  none Snack:  1/2 oreo Dinner:  Chili beans with meat and tomatoes Snack:  crackers and cheese, cookie Beverages:  coffee with small portion cream, water   Specific focus but not limited to the following: Review of blood glucose monitoring and interpretation including the recommended target ranges and Hgb A1c.  Review of carbohydrate counting, importance of regularly scheduled meals/snacks, and meal planning to improve quality of diet. Review of the effects of physical activity on glucose levels and long-term glucose control.  Recommended goal of 150 minutes of physical activity/week. Review of patient medications and discussed role of medication on blood glucose and possible side effects. Discussion of strategies to manage stress, psychosocial issues, and other obstacles to diabetes management. Review of short-term complications: hyper- and hypo-glycemia (causes, symptoms, and treatment options) Review of prevention, detection, and treatment of long-term complications.  Discussion of the role of prolonged elevated glucose levels on body systems.  Continuing Goals: Avoid skipping meals Find ways to add fruits and vegetables.  Snack or light meal ideas:  Apple and peanut butter or nuts Protein cereal, berries, lactaid milk Chicken or  tuna salad with crackers, raw vegetables Salad with chicken or tuna or cheese Cheese or peanut butter toast, fresh fruit Toast with avocado, cheese, pico, optional egg Bean burrito, fresh fruit  Future Follow up:  prn

## 2024-09-21 ENCOUNTER — Other Ambulatory Visit (HOSPITAL_COMMUNITY): Payer: Self-pay

## 2024-09-21 ENCOUNTER — Other Ambulatory Visit: Payer: Self-pay

## 2024-09-22 ENCOUNTER — Other Ambulatory Visit: Payer: Self-pay

## 2024-09-26 ENCOUNTER — Telehealth

## 2024-09-26 ENCOUNTER — Telehealth: Admitting: Physician Assistant

## 2024-09-26 DIAGNOSIS — J069 Acute upper respiratory infection, unspecified: Secondary | ICD-10-CM

## 2024-09-26 NOTE — Progress Notes (Signed)
   Thank you for the details you included in the comment boxes. Those details are very helpful in determining the best course of treatment for you and help [place] to provide the best care.Because of your symptoms, we recommend that you schedule a Virtual Urgent Care video visit in order for the provider to better assess what is going on.  The provider will be able to give you a more accurate diagnosis and treatment plan if we can more freely discuss your symptoms and with the addition of a virtual examination.   If you change your visit to a video visit, we will bill your insurance (similar to an office visit) and you will not be charged for this e-Visit. You will be able to stay at home and speak with the first available [facility] advanced practice provider. The link to do a video visit is in the drop down Menu tab of your Welcome screen in MyChart.

## 2024-09-27 ENCOUNTER — Telehealth

## 2024-09-27 ENCOUNTER — Encounter

## 2024-09-27 ENCOUNTER — Telehealth: Admitting: Family Medicine

## 2024-09-27 ENCOUNTER — Encounter: Payer: Self-pay | Admitting: Family Medicine

## 2024-09-27 DIAGNOSIS — J069 Acute upper respiratory infection, unspecified: Secondary | ICD-10-CM

## 2024-09-27 MED ORDER — FLUTICASONE PROPIONATE 50 MCG/ACT NA SUSP: 2.0000 | Freq: Every day | NASAL | 0 refills | Status: AC

## 2024-09-27 MED ORDER — BENZONATATE 100 MG PO CAPS: 100.0000 mg | ORAL_CAPSULE | Freq: Three times a day (TID) | ORAL | 0 refills | Status: AC | PRN

## 2024-09-27 MED ORDER — LIDOCAINE VISCOUS HCL 2 % MT SOLN: 5.0000 mL | Freq: Four times a day (QID) | OROMUCOSAL | 0 refills | Status: AC | PRN

## 2024-09-27 NOTE — Progress Notes (Signed)

## 2024-09-28 ENCOUNTER — Other Ambulatory Visit (HOSPITAL_COMMUNITY): Payer: Self-pay

## 2024-09-28 ENCOUNTER — Ambulatory Visit: Admitting: Family Medicine

## 2024-10-02 NOTE — Progress Notes (Signed)
" °  Because Tracy Price, I feel your condition warrants further evaluation and I recommend that you be seen in a face-to-face visit.   NOTE: There will be NO CHARGE for this E-Visit   If you are having a true medical emergency, please call 911.     For an urgent face to face visit, [place] has multiple urgent care centers for your convenience.  Click the link below for the full list of locations and hours, walk-in wait times, appointment scheduling options and driving directions:  Urgent Care - [place], [place], [place], [place], [place], [place]  [place]     Your MyChart E-visit questionnaire answers were reviewed by a board certified advanced clinical practitioner to complete your personal care plan based on your specific symptoms.    Thank you for using e-Visits.    "

## 2024-10-04 ENCOUNTER — Encounter: Payer: Self-pay | Admitting: Family Medicine

## 2024-10-04 ENCOUNTER — Ambulatory Visit: Admitting: Family Medicine

## 2024-10-05 ENCOUNTER — Encounter: Payer: Self-pay | Admitting: Family Medicine

## 2024-10-06 ENCOUNTER — Other Ambulatory Visit: Payer: Self-pay

## 2024-10-06 ENCOUNTER — Other Ambulatory Visit (HOSPITAL_COMMUNITY): Payer: Self-pay

## 2024-10-06 DIAGNOSIS — E119 Type 2 diabetes mellitus without complications: Secondary | ICD-10-CM

## 2024-10-06 MED ORDER — RYBELSUS 14 MG PO TABS: 14.0000 mg | ORAL_TABLET | Freq: Every day | ORAL | 3 refills | Status: AC

## 2024-10-06 MED ORDER — DEXCOM G7 15 DAY SENSOR MISC: 1.0000 [IU] | 5 refills | Status: AC

## 2024-10-08 ENCOUNTER — Other Ambulatory Visit: Payer: Self-pay | Admitting: Family Medicine

## 2024-10-08 DIAGNOSIS — G8929 Other chronic pain: Secondary | ICD-10-CM

## 2024-10-08 MED ORDER — TRAMADOL HCL 50 MG PO TABS: 100.0000 mg | ORAL_TABLET | Freq: Three times a day (TID) | ORAL | 0 refills | Status: DC | PRN

## 2024-10-13 ENCOUNTER — Encounter: Admitting: Gastroenterology

## 2024-10-13 ENCOUNTER — Other Ambulatory Visit: Payer: Self-pay | Admitting: Family Medicine

## 2024-10-13 DIAGNOSIS — E039 Hypothyroidism, unspecified: Secondary | ICD-10-CM

## 2024-10-13 NOTE — Telephone Encounter (Signed)
 Prescription Request  10/13/2024  LOV: 08/19/2024  What is the name of the medication or equipment?   levothyroxine  (SYNTHROID ) 88 MCG tablet  **90 day script requested**  Have you contacted your pharmacy to request a refill? Yes   Which pharmacy would you like this sent to?  [facility] - [place], [place] - [address] [place] [address] Phone: [phone] Fax: [phone]    Patient notified that their request is being sent to the clinical staff for review and that they should receive a response within 2 business days.   Please advise pharmacist.

## 2024-10-14 ENCOUNTER — Encounter: Payer: Self-pay | Admitting: "Endocrinology

## 2024-10-14 ENCOUNTER — Other Ambulatory Visit: Payer: Self-pay | Admitting: Endocrinology

## 2024-10-14 DIAGNOSIS — E1140 Type 2 diabetes mellitus with diabetic neuropathy, unspecified: Secondary | ICD-10-CM

## 2024-10-14 NOTE — Telephone Encounter (Signed)
 Requested Prescriptions  Refused Prescriptions Disp Refills   levothyroxine  (SYNTHROID ) 88 MCG tablet 90 tablet 3    Sig: Take 1 tablet (88 mcg total) by mouth daily.     Endocrinology:  Hypothyroid Agents Failed - 10/14/2024  2:25 PM      Failed - TSH in normal range and within 360 days    TSH  Date Value Ref Range Status  12/18/2023 6.47 (H) 0.40 - 4.50 mIU/L Final         Passed - Valid encounter within last 12 months    Recent Outpatient Visits           1 month ago Trigger middle finger of left hand   [facility] Pickard, Butler DASEN, MD   10 months ago Screen for colon cancer   [place] [facility] Duanne Butler DASEN, MD   1 year ago Hypothyroidism (acquired)   [place] [facility] Duanne Butler DASEN, MD   1 year ago Unintentional weight loss   [place] [facility] Duanne Butler DASEN, MD   1 year ago Unintentional weight loss   [place] [facility] Pickard, Butler DASEN, MD

## 2024-10-19 ENCOUNTER — Other Ambulatory Visit: Payer: Self-pay | Admitting: Family Medicine

## 2024-11-05 ENCOUNTER — Encounter: Payer: Self-pay | Admitting: Family Medicine

## 2024-11-08 ENCOUNTER — Other Ambulatory Visit: Payer: Self-pay

## 2024-11-08 DIAGNOSIS — G8929 Other chronic pain: Secondary | ICD-10-CM

## 2024-11-09 ENCOUNTER — Encounter: Payer: Self-pay | Admitting: "Endocrinology

## 2024-11-09 ENCOUNTER — Telehealth: Payer: Self-pay

## 2024-11-09 MED ORDER — TRAMADOL HCL 50 MG PO TABS: 100.0000 mg | ORAL_TABLET | Freq: Three times a day (TID) | ORAL | 0 refills | Status: AC | PRN

## 2024-12-15 ENCOUNTER — Ambulatory Visit: Admitting: "Endocrinology

## 2025-01-03 ENCOUNTER — Ambulatory Visit: Admitting: "Endocrinology

## 2025-05-11 ENCOUNTER — Ambulatory Visit
